# Patient Record
Sex: Male | Born: 1960 | Race: White | Hispanic: No | Smoking: Former smoker
Health system: Southern US, Community
[De-identification: ages and names within clinical notes are randomized; demographics above are authoritative.]

## PROBLEM LIST (undated history)

## (undated) DIAGNOSIS — A770 Spotted fever due to Rickettsia rickettsii: Secondary | ICD-10-CM

## (undated) DIAGNOSIS — K219 Gastro-esophageal reflux disease without esophagitis: Secondary | ICD-10-CM

## (undated) DIAGNOSIS — E119 Type 2 diabetes mellitus without complications: Secondary | ICD-10-CM

## (undated) DIAGNOSIS — I10 Essential (primary) hypertension: Secondary | ICD-10-CM

## (undated) DIAGNOSIS — N200 Calculus of kidney: Secondary | ICD-10-CM

## (undated) DIAGNOSIS — E785 Hyperlipidemia, unspecified: Secondary | ICD-10-CM

## (undated) DIAGNOSIS — F17200 Nicotine dependence, unspecified, uncomplicated: Secondary | ICD-10-CM

## (undated) DIAGNOSIS — Z91018 Allergy to other foods: Secondary | ICD-10-CM

## (undated) HISTORY — DX: Hyperlipidemia, unspecified: E78.5

## (undated) HISTORY — PX: KIDNEY STONE SURGERY: SHX686

## (undated) HISTORY — PX: KNEE SURGERY: SHX244

## (undated) HISTORY — DX: Allergy to other foods: Z91.018

## (undated) HISTORY — DX: Nicotine dependence, unspecified, uncomplicated: F17.200

## (undated) HISTORY — DX: Gastro-esophageal reflux disease without esophagitis: K21.9

---

## 1996-07-18 ENCOUNTER — Encounter: Payer: Self-pay | Admitting: Gastroenterology

## 2001-09-10 ENCOUNTER — Emergency Department (HOSPITAL_COMMUNITY): Admission: EM | Admit: 2001-09-10 | Discharge: 2001-09-10 | Payer: Self-pay | Admitting: Emergency Medicine

## 2001-09-11 ENCOUNTER — Encounter: Payer: Self-pay | Admitting: Emergency Medicine

## 2004-01-12 ENCOUNTER — Ambulatory Visit: Payer: Self-pay | Admitting: Gastroenterology

## 2004-06-16 ENCOUNTER — Emergency Department (HOSPITAL_COMMUNITY): Admission: EM | Admit: 2004-06-16 | Discharge: 2004-06-16 | Payer: Self-pay | Admitting: Emergency Medicine

## 2004-06-20 ENCOUNTER — Ambulatory Visit (HOSPITAL_COMMUNITY): Admission: RE | Admit: 2004-06-20 | Discharge: 2004-06-20 | Payer: Self-pay | Admitting: Urology

## 2004-09-26 ENCOUNTER — Ambulatory Visit (HOSPITAL_COMMUNITY): Admission: RE | Admit: 2004-09-26 | Discharge: 2004-09-26 | Payer: Self-pay | Admitting: Urology

## 2005-07-25 ENCOUNTER — Ambulatory Visit: Payer: Self-pay | Admitting: Gastroenterology

## 2006-07-18 ENCOUNTER — Ambulatory Visit: Payer: Self-pay | Admitting: Gastroenterology

## 2007-07-19 ENCOUNTER — Telehealth: Payer: Self-pay | Admitting: Gastroenterology

## 2007-08-12 ENCOUNTER — Telehealth: Payer: Self-pay | Admitting: Gastroenterology

## 2008-02-18 ENCOUNTER — Telehealth: Payer: Self-pay | Admitting: Gastroenterology

## 2008-04-23 ENCOUNTER — Telehealth: Payer: Self-pay | Admitting: Gastroenterology

## 2008-04-28 ENCOUNTER — Ambulatory Visit: Payer: Self-pay | Admitting: Gastroenterology

## 2008-04-28 DIAGNOSIS — K219 Gastro-esophageal reflux disease without esophagitis: Secondary | ICD-10-CM | POA: Insufficient documentation

## 2008-04-28 DIAGNOSIS — Z87442 Personal history of urinary calculi: Secondary | ICD-10-CM | POA: Insufficient documentation

## 2008-09-07 ENCOUNTER — Telehealth: Payer: Self-pay | Admitting: Gastroenterology

## 2008-09-08 ENCOUNTER — Encounter: Payer: Self-pay | Admitting: Gastroenterology

## 2008-09-22 ENCOUNTER — Telehealth: Payer: Self-pay | Admitting: Gastroenterology

## 2010-04-12 NOTE — Progress Notes (Signed)
Summary: Aciphex   Phone Note Call from Patient Call back at Home Phone [phone]   Caller: Wife Jimmy Collins Call For: Jimmy Collins Reason for Call: Talk to Nurse Details for Reason: Savings Card Summary of Call: Needs to know saving card for Aciphex Initial call taken by: Darryl Lent CMA (AAMA),  September 22, 2008 10:00 AM  Follow-up for Phone Call        Discount card left up front for pt to pick up. Follow-up by: Christie Nottingham CMA (AAMA),  September 22, 2008 10:44 AM

## 2010-04-12 NOTE — Progress Notes (Signed)
Summary: refill  Medications Added ACIPHEX 20 MG  TBEC (RABEPRAZOLE SODIUM) one tablet by mouth once daily       Phone Note Call from Patient Call back at Home Phone [phone]   Caller: Sheran Spine Call For: Russella Dar Reason for Call: Talk to Nurse Details for Reason: meds Summary of Call: meds were denied --can enough meds be called in until REv in 1/12 [facility] [address] # [phone] Initial call taken by: Guadlupe Spanish [facility],  February 18, 2008 8:16 AM  Follow-up for Phone Call        Rx was sent to pts pharmacy and pt notified.  Follow-up by: Christie Nottingham CMA,  February 18, 2008 8:43 AM    New/Updated Medications: ACIPHEX 20 MG  TBEC (RABEPRAZOLE SODIUM) one tablet by mouth once daily   Prescriptions: ACIPHEX 20 MG  TBEC (RABEPRAZOLE SODIUM) one tablet by mouth once daily  #30 x 0   Entered by:   Christie Nottingham CMA   Authorized by:   Meryl Dare MD [facility]   Signed by:   Christie Nottingham CMA on 02/18/2008   Method used:   [facility]        [facility]  [address] [phone]* (retail)       [address]       [place], [place]  [address]       Ph: [phone] or [phone]       Fax: [phone]   RxID:   [id number]

## 2010-04-12 NOTE — Procedures (Signed)
Summary: Gastroenterology EGD PATH  Gastroenterology EGD PATH   Imported By: Lowry Ram CMA 03/23/2008 15:45:04  _____________________________________________________________________  External Attachment:    Type:   Image     Comment:   External Document

## 2010-04-12 NOTE — Progress Notes (Signed)
Summary: meds  Medications Added ACIPHEX 20 MG  TBEC (RABEPRAZOLE SODIUM) one tablet by mouth once daily       Phone Note Call from Patient Call back at Home Phone [phone]   Caller: Patient Call For: Jimmy Collins Reason for Call: Talk to Nurse Summary of Call: Patient needs to speak to nurse regarding insurance and his meds and he also needs samples for his aciphex Initial call taken by: Tawni Levy,  September 07, 2008 8:40 AM  Follow-up for Phone Call        Pt states new insurance will not cover Aciphex and it needs a PA. Pt would like samples until PA can be approved. I left samples up front for pt to pick up and told pt that we will work on [place]. Follow-up by: Christie Nottingham CMA,  September 07, 2008 10:14 AM    New/Updated Medications: ACIPHEX 20 MG  TBEC (RABEPRAZOLE SODIUM) one tablet by mouth once daily

## 2010-04-12 NOTE — Medication Information (Signed)
Summary: PPI til 06/05/11/[employer]  PPI [place] 06/05/11/[employer]   Imported By: Lester Chinook 09/10/2008 10:15:00  _____________________________________________________________________  External Attachment:    Type:   Image     Comment:   External Document

## 2010-04-12 NOTE — Procedures (Signed)
Summary: Gastroenterology EGD  Gastroenterology EGD   Imported By: Lowry Ram CMA 03/23/2008 15:43:19  _____________________________________________________________________  External Attachment:    Type:   Image     Comment:   External Document

## 2010-04-12 NOTE — Progress Notes (Signed)
Summary: Samples/Med change   Phone Note Call from Patient Call back at Home Phone [phone] Call back at or [phone] [name]/spouse   Summary of Call: PT given samples of medicine that has worked well for [place], they forget the name of sample. PT cannot afford Prevacid, would like to change from prevacid to this "little blue pill" Patient's chart has been requested.  Initial call taken by: Verdell Face,  Jul 19, 2007 8:34 AM  Follow-up for Phone Call        per pt. wife-They cannot afford Prevacid. Kapidex two times a day is working for pt., but they aren't sure if they will be able to afford script. Pt. wife to call insurance company and find out which PPI is on their prefered list, call back with that information. Pt. wife states she will call Sherri on Monday. Follow-up by: Laureen Ochs LPN,  Jul 18, 8293 10:14 AM  Additional Follow-up for Phone Call Additional follow up Details #1::        will await return phone call Additional Follow-up by: Darcey Nora RN,  Jul 22, 2007 4:28 PM

## 2010-07-29 NOTE — Op Note (Signed)
Jimmy Collins, Jimmy Collins                ACCOUNT NO.:  [id number]   MEDICAL RECORD NO.:  [id number]          PATIENT TYPE:  AMB   LOCATION:  DAY                          FACILITY[facility]  [facility]   PHYSICIAN:  Claudette Laws, M.D.  DATE OF BIRTH:  [birth year]   DATE OF PROCEDURE:  09/26/2004  DATE OF DISCHARGE:                                 OPERATIVE REPORT   PREOPERATIVE DIAGNOSES:  1.  Proximal right ureteral stone.  2.  Status post extracorporeal shock wave lithotripsy, right renal calculus.  3.  Bilateral renal calculi.   POSTOPERATIVE DIAGNOSES:  1.  Proximal right ureteral stone.  2.  Status post extracorporeal shock wave lithotripsy, right renal calculus.  3.  Bilateral renal calculi.   OPERATION:  1.  Cystoscopy.  2.  Right retrograde pyeloureterogram.  3.  Insertion of a 6 French 26 cm double-J stent.   SURGEON:  Claudette Laws, M.D.   HISTORY:  This is a 50 year old man who over three months ago underwent ESWL  for a right renal stone.  The stone fragmented, except he was left with a  fragment of about 5 x 5 mm, which has been lodged now in the upper right  ureter with no progression now for several weeks.  Intermittently, he does  have renal colic.  We discussed treatment options, and I thought the best  option would be to put up a double-J stent prior to treatment of the  fragment.  He understands and agrees to the proposed surgery.   PROCEDURE:  The patient was prepped and draped in the dorsal lithotomy  position under LMA anesthesia.  Urethroscopy was performed with a #22 [place]  cystoscope.  Interestingly, he was found to have some, what appeared to be,  disruption of the mucosa of the deep bulbous urethra.  Initially, I was  unable to negotiate the cystoscope through the membranous urethra.  I then  passed a 16 [place] coude tip catheter, and this seemed to work its way into  the bladder without any difficulty.  I then backed out the catheter and  recystoscoped him.  He  appeared to have rather tight, membranous urethra,  but the prostate looked normal.  Some elevation of the posterior urethra.  The bladder itself was grossly normal.  No tumors, no calculi.  Normal  ureteral orifices.  Initially, I passed up a 0.38 __________ wire under  fluoroscopic control.  I then passed up a 6 [place] open-ended ureteral  catheter  and removed the wire, performed a retrograde study.  There was  minimal dilation of the ureter.  There was a slight hang-up right over the  course of the stone.  Otherwise, only a mild, if any, right hydronephrosis.  Normal calyces.   I then passed up a guidewire.  Again, using fluoroscopic control.  I backed  out the 6 [place] open-ended ureteral catheter and then over the guidewire,  passed a 6 French 26 cm double-J stent, again using fluoroscopic control.  The proximal end was curled up in the mid calyceal system.  The distal end  was curled up  in the bladder.  I then emptied the bladder and backed out the  cystoscope, again, under direct vision, trying to get an idea of what was  going on in  the deep bulbous, membranous urethra.  I then put in a B&O suppository for  anesthetic purposes, and he was taken back to the recovery room in  satisfactory condition.  The plan now is to take him over to the lithotripsy  truck for ESWL later on this afternoon.       RFS/MEDQ  D:  09/26/2004  T:  09/26/2004  Job:  [id number]

## 2010-07-29 NOTE — Assessment & Plan Note (Signed)
[facility]                         GASTROENTEROLOGY OFFICE NOTE   NAME:ASHLEYJaggar, Benko                       MRN:          [id number]  DATE:07/18/2006                            DOB:          [birth year]    Mr. Salinger returns for followup of GERD.  In addition, he notes a three-  day history of brief, sharp epigastric pain following meals.  The pain  lasted for about 2 to 3 minutes at a time, and after three days, the  symptoms completely abated, and he has been symptom- free for the past  several days.  He has no dysphagia, odynophagia, weight loss, change in  bowel habits, nausea, vomiting, melena, hematochezia or change in stool  caliber.  He previously underwent upper endoscopy in May of 1998, which  showed a small size in hiatal hernia and no other abnormalities.  His  reflux symptoms have generally remained under excellent control on  Prevacid.   CURRENT MEDICATIONS:  1. Prevacid 30 mg p.o. q.a.m.  2. Tylenol p.r.n.   MEDICATION ALLERGIES:  NONE KNOWN.   PHYSICAL EXAMINATION:  GENERAL:  He is having no acute distress.  VITAL SIGNS:  Weight 193.8 pounds, blood pressure 130/72, pulse 84 and  regular.  HEENT: Anicteric. Sclerae, oropharynx clear.  CHEST:  Clear to auscultation bilaterally.  CARDIAC:  Regular rate and rhythm without murmurs appreciated.  ABDOMEN:  Soft and non-tender with normoactive bowel sounds.  No  palpable organomegaly or masses or hernia.   ASSESSMENT/PLAN:  1. Gastroesophageal reflux disease.  Symptoms under excellent control.      I am not certain as to the cause of his several-day history of      brief postprandial epigastric pain, but he will monitor these      symptoms, and if they recur and persist, he will contact our      office.  Renew Prevacid for 12 months and continue anti-reflux      measures.  Return office visit in one year.  2. Colorectal cancer screening.  Colonoscopy beginning at age 50 is  recommended.     Venita Lick. Russella Dar, MD, [facility]  Electronically Signed    MTS/MedQ  DD: 07/24/2006  DT: 07/24/2006  Job #: [id number]

## 2010-09-19 ENCOUNTER — Encounter: Payer: Self-pay | Admitting: Family Medicine

## 2010-09-19 DIAGNOSIS — K219 Gastro-esophageal reflux disease without esophagitis: Secondary | ICD-10-CM | POA: Insufficient documentation

## 2011-05-29 ENCOUNTER — Encounter: Payer: Self-pay | Admitting: Gastroenterology

## 2011-08-24 ENCOUNTER — Emergency Department (HOSPITAL_COMMUNITY): Payer: BC Managed Care – PPO

## 2011-08-24 ENCOUNTER — Encounter (HOSPITAL_COMMUNITY): Payer: Self-pay | Admitting: *Deleted

## 2011-08-24 ENCOUNTER — Emergency Department (HOSPITAL_COMMUNITY)
Admission: EM | Admit: 2011-08-24 | Discharge: 2011-08-24 | Disposition: A | Discharge status: Home / Self Care | Payer: BC Managed Care – PPO | Attending: Emergency Medicine | Admitting: Emergency Medicine

## 2011-08-24 DIAGNOSIS — K219 Gastro-esophageal reflux disease without esophagitis: Secondary | ICD-10-CM | POA: Insufficient documentation

## 2011-08-24 DIAGNOSIS — S39012A Strain of muscle, fascia and tendon of lower back, initial encounter: Secondary | ICD-10-CM

## 2011-08-24 DIAGNOSIS — F17200 Nicotine dependence, unspecified, uncomplicated: Secondary | ICD-10-CM | POA: Insufficient documentation

## 2011-08-24 DIAGNOSIS — M549 Dorsalgia, unspecified: Secondary | ICD-10-CM | POA: Insufficient documentation

## 2011-08-24 HISTORY — DX: Spotted fever due to Rickettsia rickettsii: A77.0

## 2011-08-24 HISTORY — DX: Calculus of kidney: N20.0

## 2011-08-24 LAB — URINALYSIS, ROUTINE W REFLEX MICROSCOPIC
Bilirubin Urine: NEGATIVE
Glucose, UA: NEGATIVE mg/dL
Ketones, ur: NEGATIVE mg/dL
Leukocytes, UA: NEGATIVE
Nitrite: NEGATIVE
Protein, ur: NEGATIVE mg/dL
Specific Gravity, Urine: 1.023 (ref 1.005–1.030)
Urobilinogen, UA: 0.2 mg/dL (ref 0.0–1.0)
pH: 6 (ref 5.0–8.0)

## 2011-08-24 LAB — URINE MICROSCOPIC-ADD ON

## 2011-08-24 MED ORDER — OXYCODONE-ACETAMINOPHEN 5-325 MG PO TABS: 1.0000 | ORAL_TABLET | ORAL | Status: AC | PRN

## 2011-08-24 MED ORDER — HYDROMORPHONE HCL PF 2 MG/ML IJ SOLN
2.0000 mg | Freq: Once | INTRAMUSCULAR | Status: AC
  Administered 2011-08-24: 2 mg via INTRAMUSCULAR
  Filled 2011-08-24: qty 1

## 2011-08-24 MED ORDER — IBUPROFEN 600 MG PO TABS: 600.0000 mg | ORAL_TABLET | Freq: Four times a day (QID) | ORAL | Status: AC | PRN

## 2011-08-24 MED ORDER — OXYCODONE-ACETAMINOPHEN 5-325 MG PO TABS
2.0000 | ORAL_TABLET | Freq: Once | ORAL | Status: AC
  Administered 2011-08-24: 2 via ORAL
  Filled 2011-08-24: qty 2

## 2011-08-24 MED ORDER — ONDANSETRON 4 MG PO TBDP
8.0000 mg | ORAL_TABLET | Freq: Once | ORAL | Status: AC
  Administered 2011-08-24: 8 mg via ORAL

## 2011-08-24 MED ORDER — ONDANSETRON 4 MG PO TBDP
ORAL_TABLET | ORAL | Status: AC
  Filled 2011-08-24: qty 2

## 2011-08-24 NOTE — ED Provider Notes (Signed)
History     CSN: [id number]  Arrival date & time 08/24/11  4098   First MD Initiated Contact with Patient 08/24/11 [phone]      Chief Complaint  Patient presents with  . Back Pain    Pt seen with medical student, I performed history/physical/documentation    Patient is a 51 y.o. male presenting with back pain. The history is provided by the patient.  Back Pain  This is a new problem. The current episode started yesterday. The problem occurs constantly. The problem has been gradually worsening. The pain is associated with no known injury. The pain is present in the lumbar spine. The pain does not radiate. The pain is moderate. The symptoms are aggravated by certain positions and twisting. The pain is the same all the time. Pertinent negatives include no chest pain, no fever, no abdominal pain, no bowel incontinence, no bladder incontinence, no paresis and no weakness. He has tried NSAIDs for the symptoms. The treatment provided mild relief.  PT reports low back pain since yesterday No falls/injury but does report consistent heavy lifting at work on a daily basis No leg weakness No dysuria No fever No urinary symptoms   Past Medical History  Diagnosis Date  . GERD (gastroesophageal reflux disease)   . [facility] spotted fever   . Kidney stones     Past Surgical History  Procedure Date  . Knee surgery     left  . Kidney stone surgery     bilateral    History reviewed. No pertinent family history.  History  Substance Use Topics  . Smoking status: Current Everyday Smoker -- 1.0 packs/day  . Smokeless tobacco: Not on file  . Alcohol Use: No      Review of Systems  Constitutional: Negative for fever.  Cardiovascular: Negative for chest pain.  Gastrointestinal: Negative for abdominal pain and bowel incontinence.  Genitourinary: Negative for bladder incontinence.  Musculoskeletal: Positive for back pain.  Neurological: Negative for weakness.  All other systems  reviewed and are negative.    Allergies  Review of patient's allergies indicates no known allergies.  Home Medications   Current Outpatient Rx  Name Route Sig Dispense Refill  . LANSOPRAZOLE 30 MG PO CPDR Oral Take 30 mg by mouth daily.        BP 113/65  Pulse 81  Temp 97.9 F (36.6 C) (Oral)  Resp 18  SpO2 98%  Physical Exam CONSTITUTIONAL: Well developed/well nourished HEAD AND FACE: Normocephalic/atraumatic EYES: EOMI/PERRL ENMT: Mucous membranes moist NECK: supple no meningeal signs SPINE:entire spine nontender CV: S1/S2 noted, no murmurs/rubs/gallops noted LUNGS: Lungs are clear to auscultation bilaterally, no apparent distress ABDOMEN: soft, nontender, no rebound or guarding GU:no cva tenderness NEURO: Awake/alert, equal distal motor: hip flexion/knee flexion/extension, ankle dorsi/plantar flexion, great toe extension intact bilaterally, no clonus bilaterally,no apparent sensory deficit in any dermatome.   Pt is able to ambulate. EXTREMITIES: pulses normal, full ROM SKIN: warm, color normal PSYCH: no abnormalities of mood noted  ED Course  Procedures    Labs Reviewed  URINALYSIS, ROUTINE W REFLEX MICROSCOPIC   Pt improved, ambulatory, u/a reviewed (blood noted but history not c/w kidney stone and can f/u with PCP for this)  The patient appears reasonably screened and/or stabilized for discharge and I doubt any other medical condition or other [facility] requiring further screening, evaluation, or treatment in the ED at this time prior to discharge.   MDM  Nursing notes including past medical history and social history reviewed  and considered in documentation xrays reviewed and considered All labs/vitals reviewed and considered         Joya Gaskins, MD 08/24/11 1220

## 2011-08-24 NOTE — ED Notes (Signed)
Pt getting sick on stomach while going over discharge instructions.  Verbal order received for zofran 8 mg ODT

## 2011-08-24 NOTE — ED Notes (Signed)
Pt c/o lower back pain since yesterday, no noted injury.  Unable to sit/walk comfortably

## 2011-08-24 NOTE — ED Notes (Signed)
Family at bedside. 

## 2011-08-24 NOTE — ED Notes (Signed)
MD at bedside. 

## 2011-08-24 NOTE — ED Notes (Signed)
Given a urinal and asked to slip jeans off for the xray.

## 2011-08-24 NOTE — Discharge Instructions (Signed)

## 2011-08-24 NOTE — ED Notes (Signed)
Pain in center of back, not to right or left.

## 2011-08-24 NOTE — ED Notes (Signed)
Pt given peanut butter crackers and coke to drink. Offered wheel chair for disposition but declined at this time.

## 2012-06-18 ENCOUNTER — Encounter: Payer: Self-pay | Admitting: Gastroenterology

## 2013-12-30 ENCOUNTER — Encounter (HOSPITAL_COMMUNITY): Payer: Self-pay | Admitting: Emergency Medicine

## 2013-12-30 ENCOUNTER — Emergency Department (HOSPITAL_COMMUNITY)
Admission: EM | Admit: 2013-12-30 | Discharge: 2013-12-31 | Disposition: A | Discharge status: Home / Self Care | Payer: BC Managed Care – PPO | Attending: Emergency Medicine | Admitting: Emergency Medicine

## 2013-12-30 DIAGNOSIS — T788XXA Other adverse effects, not elsewhere classified, initial encounter: Secondary | ICD-10-CM | POA: Insufficient documentation

## 2013-12-30 DIAGNOSIS — Z79899 Other long term (current) drug therapy: Secondary | ICD-10-CM | POA: Insufficient documentation

## 2013-12-30 DIAGNOSIS — Z720 Tobacco use: Secondary | ICD-10-CM | POA: Insufficient documentation

## 2013-12-30 DIAGNOSIS — Z87442 Personal history of urinary calculi: Secondary | ICD-10-CM | POA: Insufficient documentation

## 2013-12-30 DIAGNOSIS — R0602 Shortness of breath: Secondary | ICD-10-CM | POA: Insufficient documentation

## 2013-12-30 DIAGNOSIS — Y9269 Other specified industrial and construction area as the place of occurrence of the external cause: Secondary | ICD-10-CM | POA: Insufficient documentation

## 2013-12-30 DIAGNOSIS — Y990 Civilian activity done for income or pay: Secondary | ICD-10-CM | POA: Insufficient documentation

## 2013-12-30 DIAGNOSIS — Y9389 Activity, other specified: Secondary | ICD-10-CM | POA: Insufficient documentation

## 2013-12-30 DIAGNOSIS — R21 Rash and other nonspecific skin eruption: Secondary | ICD-10-CM

## 2013-12-30 DIAGNOSIS — L500 Allergic urticaria: Secondary | ICD-10-CM | POA: Insufficient documentation

## 2013-12-30 DIAGNOSIS — K219 Gastro-esophageal reflux disease without esophagitis: Secondary | ICD-10-CM | POA: Insufficient documentation

## 2013-12-30 DIAGNOSIS — Z8619 Personal history of other infectious and parasitic diseases: Secondary | ICD-10-CM | POA: Insufficient documentation

## 2013-12-30 DIAGNOSIS — T7840XA Allergy, unspecified, initial encounter: Secondary | ICD-10-CM

## 2013-12-30 DIAGNOSIS — R1310 Dysphagia, unspecified: Secondary | ICD-10-CM | POA: Insufficient documentation

## 2013-12-30 NOTE — ED Notes (Signed)
Bed: [id number] Expected date:  Expected time:  Means of arrival:  Comments: EMS 53yo M, dizziness, itching, ? Allergic reaction

## 2013-12-30 NOTE — ED Notes (Signed)
Pt arrived via EMS from his place of employment.  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  Pt began having extreme itching.  Pt also began to have hives.  Pt was given 50 mg of benadryl, 50 of zantac, 125 mg of solumedrol and a 500 ml bolus of saline by EMS with relief of symptoms.  Pt initially for EMS was hypotensive but got better after having a bolus of fluid

## 2013-12-31 MED ORDER — EPINEPHRINE 0.3 MG/0.3ML IJ SOAJ
0.3000 mg | Freq: Once | INTRAMUSCULAR | Status: AC
  Administered 2013-12-31: 0.3 mg via INTRAMUSCULAR
  Filled 2013-12-31: qty 0.3

## 2013-12-31 MED ORDER — EPINEPHRINE 0.3 MG/0.3ML IJ SOAJ: 0.3000 mg | Freq: Once | INTRAMUSCULAR | Status: DC | PRN

## 2013-12-31 MED ORDER — DIPHENHYDRAMINE HCL 50 MG/ML IJ SOLN
25.0000 mg | Freq: Once | INTRAMUSCULAR | Status: AC
  Administered 2013-12-31: 25 mg via INTRAVENOUS
  Filled 2013-12-31: qty 1

## 2013-12-31 MED ORDER — PREDNISONE 20 MG PO TABS: 40.0000 mg | ORAL_TABLET | Freq: Every day | ORAL | Status: DC

## 2013-12-31 NOTE — ED Provider Notes (Signed)
CSN: [id number]     Arrival date & time 12/30/13  2318 History   First MD Initiated Contact with Patient 12/30/13 2337     Chief Complaint  Patient presents with  . Rash     (Consider location/radiation/quality/duration/timing/severity/associated sxs/prior Treatment) HPI Comments: Patient is a 53 year old male with history of GERD, [place] spotted fever, kidney stones he presents to the emergency department today for sudden onset itchy rash. He reports that he was at work prior to arrival when he began to have extreme itching diffusely over his body. He broke out in hives. He has associated shortness of breath and sensation of his throat was closing. He received 50 mg of Benadryl, 50 mg of Zantac, and 125 mg of Solu-Medrol by EMS. He feels significantly improved. He denies any current shortness of breath, sensation that his throat is closing. He has rash, but states that his rash is much less severe. He denies any known allergies. 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 He denies any new foods.  Patient is a 53 y.o. male presenting with rash. The history is provided by the patient. No language interpreter was used.  Rash Associated symptoms: shortness of breath   Associated symptoms: no abdominal pain, no fever, no nausea and not vomiting     Past Medical History  Diagnosis Date  . GERD (gastroesophageal reflux disease)   . [facility]   . Kidney stones    Past Surgical History  Procedure Laterality Date  . Knee surgery      left  . Kidney stone surgery      bilateral   History reviewed. No pertinent family history. History  Substance Use Topics  . Smoking status: Current Every Day Smoker -- 1.00 packs/day  . Smokeless tobacco: Not on file  . Alcohol Use: No    Review of Systems  Constitutional: Negative for fever and chills.  HENT: Positive for trouble swallowing.   Respiratory: Positive for shortness of breath.   Cardiovascular:  Negative for chest pain.  Gastrointestinal: Negative for nausea, vomiting and abdominal pain.  Skin: Positive for rash.  All other systems reviewed and are negative.     Allergies  Review of patient's allergies indicates no known allergies.  Home Medications   Prior to Admission medications   Medication Sig Start Date End Date Taking? Authorizing Provider  Aspirin-Acetaminophen-Caffeine (GOODY HEADACHE PO) Take 1 each by mouth daily as needed (headache).   Yes Historical Provider, MD  lansoprazole (PREVACID) 30 MG capsule Take 30 mg by mouth daily.     Yes Historical Provider, MD   BP 109/66  Pulse 94  Temp(Src) 97.3 F (36.3 C) (Oral)  Resp 16  SpO2 100% Physical Exam  Nursing note and vitals reviewed. Constitutional: He is oriented to person, place, and time. He appears well-developed and well-nourished. No distress.  Laying flat in bed, appears comfortable  HENT:  Head: Normocephalic and atraumatic.  Right Ear: External ear normal.  Left Ear: External ear normal.  Nose: Nose normal.  Mouth/Throat: Oropharynx is clear and moist. No trismus in the jaw. No uvula swelling.  Easily maintaining own secretions   Eyes: Conjunctivae are normal.  Neck: Normal range of motion. No tracheal deviation present.  Cardiovascular: Normal rate, regular rhythm, normal heart sounds, intact distal pulses and normal pulses.   Pulmonary/Chest: Effort normal and breath sounds normal. No stridor.  Speaking in full sentences  Abdominal: Soft. He exhibits no distension. There is  no tenderness.  Musculoskeletal: Normal range of motion.  Neurological: He is alert and oriented to person, place, and time.  Skin: Skin is warm and dry. Rash noted. Rash is urticarial. He is not diaphoretic.  Severe urticarial rash diffusely over body  Psychiatric: He has a normal mood and affect. His behavior is normal.    ED Course  Procedures (including critical care time) Labs Review Labs Reviewed - No data to  display  Imaging Review No results found.   EKG Interpretation None      CRITICAL CARE Performed by: Junious SilkMerrell, Oather Muilenburg   Total critical care time: 31  Critical care time was exclusive of separately billable procedures and treating other patients.  Critical care was necessary to treat or prevent imminent or life-threatening deterioration.  Critical care was time spent personally by me on the following activities: development of treatment plan with patient and/or surrogate as well as nursing, discussions with consultants, evaluation of patient's response to treatment, examination of patient, obtaining history from patient or surrogate, ordering and performing treatments and interventions, ordering and review of laboratory studies, ordering and review of radiographic studies, pulse oximetry and re-evaluation of patient's condition.   MDM   Final diagnoses:  Rash and nonspecific skin eruption  Allergic reaction, initial encounter   Patient to ED with severe allergic reaction. Epi given in ED. Patient will be observed to ensure no rebound or adverse effects of epi. Patient is feeling improved. Vital signs stable at this time. Patient signed out to [place], PA-C at change of shift. Dr. Norlene Campbelltter evaluated patient and agrees with plan. Patient / Family / Caregiver informed of clinical course, understand medical decision-making process, and agree with plan.   Medications  diphenhydrAMINE (BENADRYL) injection 25 mg (25 mg Intravenous Given 12/31/13 0128)  EPINEPHrine (EPI-PEN) injection 0.3 mg (0.3 mg Intramuscular Given 12/31/13 0128)      Mora BellmanHannah S Naethan Bracewell, PA-C 12/31/13 1236

## 2013-12-31 NOTE — Discharge Instructions (Signed)
Take the prescribed medication as directed.  Use epi-pen only for absolute emergencies.  If used, you must come to the ED for evaluation and close monitoring. Follow-up with your primary care physician. Return to the ED for new or worsening symptoms.

## 2013-12-31 NOTE — ED Provider Notes (Signed)
Medical screening examination/treatment/procedure(s) were conducted as a shared visit with non-physician practitioner(s) and myself.  I personally evaluated the patient during the encounter.   EKG Interpretation None     Pt seen with PA.  Sxs have improved, no rebound from epi.  Plan for d/c home to f/u with pcm/allergy  Olivia Mackielga M Earley Grobe, MD 12/31/13 [phone]

## 2013-12-31 NOTE — ED Provider Notes (Signed)
Patient received in signout from PA Merrell at shift change.  Briefly, 53 year old male with diffuse urticarial rash and bodily itching. Patient was given Benadryl, Zantac, and Solu-Medrol by EMS with improvement of his symptoms. He still had complaint of sensation of throat closing or shortness of breath thus was given epinephrine in the ED with resolution of symptoms.  Plan:  Monitor post-epi injection for rebound reaction.  If no other symptoms and VS remain stable, will discharge home with close PCP FU.  Patient due for re-check around 0530.  5:29 AM On repeat evaluation, patient talking on the phone with his wife.  Airway remains patent, he continues handling secretions well.  He denies any feelings of throat closing, trouble swallowing, or SOB.  Feel patient appropriate for discharge at this time.  Will send home with steroid taper and epi-pen should similar reaction occur.  Patient educated on use of epi-pen and need for emergent medical attention should he need to use it.  Patient will FU with PCP.  Discussed plan with patient, he/she acknowledged understanding and agreed with plan of care.  Return precautions given for new or worsening symptoms.    ICD-9-CM ICD-10-CM  1. Rash and nonspecific skin eruption 782.1 R21  2. Allergic reaction, initial encounter 995.3 T78.40XA    Garlon HatchetLisa M Anise Harbin, PA-C 12/31/13 0543

## 2014-01-01 NOTE — ED Provider Notes (Signed)
Medical screening examination/treatment/procedure(s) were conducted as a shared visit with non-physician practitioner(s) and myself.  I personally evaluated the patient during the encounter.   EKG Interpretation None     Pt seen with PA.  Diffuse hives, hypotensive in route.  Sxs not completely abated but no further true anaphylaxis.  No known trigger.  Pt to receive epi IM and will continue to monitor  Olivia Mackielga M Carolyn Maniscalco, MD 01/01/14 [phone]

## 2014-01-05 ENCOUNTER — Encounter: Payer: Self-pay | Admitting: Family Medicine

## 2014-01-05 ENCOUNTER — Ambulatory Visit (INDEPENDENT_AMBULATORY_CARE_PROVIDER_SITE_OTHER): Payer: No Typology Code available for payment source | Admitting: Family Medicine

## 2014-01-05 VITALS — BP 110/74 | HR 84 | Temp 98.1°F | Resp 18 | Ht 65.0 in | Wt 192.0 lb

## 2014-01-05 DIAGNOSIS — T782XXD Anaphylactic shock, unspecified, subsequent encounter: Secondary | ICD-10-CM

## 2014-01-05 NOTE — Progress Notes (Signed)
Subjective:    Patient ID: Jimmy Collins, male    DOB: 1960/08/28, 53 y.o.   MRN: [id number]  HPI Patient is a 53 year old white male who went to the emergency room on October 20 after having an anaphylactic reaction. He was at work when he broke out in hives and urticaria on both arms and his trunk. Shortly thereafter he experienced swelling in his tongue and in his throat. This was accompanied by stridor and shortness of breath. EMS was called. The patient was rushed to the hospital where he received an EpiPen. Immediately thereafter his symptoms improved. He was discharged on Benadryl and prednisone. At the present time he is asymptomatic. He denies any chest pain shortness of breath or dyspnea on exertion. He denies any palpitations. He denies any rash. The only food the patient ingested the day of the anaphylactic reaction were some meatballs. Otherwise he had no ingestions that day 5 from some Goody powder. He has since discontinued Goody powder and is no longer eating the frozen meatballs. There are no possible allergen exposures at work. He has returned to work without difficulty. Past Medical History  Diagnosis Date  . GERD (gastroesophageal reflux disease)   . [facility]   . Kidney stones    Past Surgical History  Procedure Laterality Date  . Knee surgery      left  . Kidney stone surgery      bilateral   Current Outpatient Prescriptions on File Prior to Visit  Medication Sig Dispense Refill  . Aspirin-Acetaminophen-Caffeine (GOODY HEADACHE PO) Take 1 each by mouth daily as needed (headache).      . EPINEPHrine (EPIPEN 2-PAK) 0.3 mg/0.3 mL IJ SOAJ injection Inject 0.3 mLs (0.3 mg total) into the muscle once as needed (for severe allergic reaction). CAll 911 immediately if you have to use this medicine  1 Device  1  . lansoprazole (PREVACID) 30 MG capsule Take 30 mg by mouth daily.         No current facility-administered medications on file prior to visit.    No Known Allergies History   Social History  . Marital Status: Married    Spouse Name: N/A    Number of Children: N/A  . Years of Education: N/A   Occupational History  . Not on file.   Social History Main Topics  . Smoking status: Current Every Day Smoker -- 1.00 packs/day  . Smokeless tobacco: Not on file  . Alcohol Use: No  . Drug Use:   . Sexual Activity:    Other Topics Concern  . Not on file   Social History Narrative  . No narrative on file      Review of Systems  All other systems reviewed and are negative.      Objective:   Physical Exam  Vitals reviewed. Constitutional: He appears well-developed and well-nourished. No distress.  HENT:  Nose: Nose normal.  Mouth/Throat: Oropharynx is clear and moist. No oropharyngeal exudate.  Neck: Neck supple. No tracheal deviation present.  Cardiovascular: Normal rate, regular rhythm and normal heart sounds.   No murmur heard. Pulmonary/Chest: Effort normal and breath sounds normal. No stridor. No respiratory distress. He has no wheezes. He has no rales.  Abdominal: Soft. Bowel sounds are normal.  Lymphadenopathy:    He has no cervical adenopathy.  Skin: No rash noted. He is not diaphoretic.          Assessment & Plan:  Anaphylactic reaction, subsequent encounter  Patient's anaphylactic  reaction was to an unknown substance. I am assuming he may have ingested some type of nut/protein in the meatballs that may have triggered the anaphylactic reaction. I gave the patient prescription for an EpiPen but I wrote in his wife's name so that hopefully they can have this covered under her insurance as the patient has no insurance at the present time and EpiPen is over $500. I also recommended that he carry Benadryl and EpiPen with him at all time. I would recommend referral to an allergist of her reaction happens again in the future.

## 2014-01-22 ENCOUNTER — Encounter: Payer: Self-pay | Admitting: Family Medicine

## 2014-01-28 ENCOUNTER — Telehealth: Payer: Self-pay | Admitting: *Deleted

## 2014-01-28 MED ORDER — CYCLOBENZAPRINE HCL 10 MG PO TABS: 10.0000 mg | ORAL_TABLET | Freq: Three times a day (TID) | ORAL | Status: DC | PRN

## 2014-01-28 NOTE — Telephone Encounter (Signed)
Send in flexeril 10mg  1 po TID prn, do not take while driving or operative heavy machinery  #30 tabs If not better needs to be seen

## 2014-01-28 NOTE — Telephone Encounter (Signed)
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  Call back number is [phone]

## 2014-01-28 NOTE — Telephone Encounter (Signed)
Medication sent in pt wife aware

## 2014-01-29 ENCOUNTER — Other Ambulatory Visit: Payer: Self-pay | Admitting: Family Medicine

## 2014-01-29 MED ORDER — CYCLOBENZAPRINE HCL 10 MG PO TABS: 10.0000 mg | ORAL_TABLET | Freq: Three times a day (TID) | ORAL | Status: DC | PRN

## 2014-01-29 NOTE — Telephone Encounter (Signed)
Refill sent to another pharmacy per pt request

## 2014-02-13 ENCOUNTER — Telehealth: Payer: Self-pay | Admitting: Family Medicine

## 2014-02-13 NOTE — Telephone Encounter (Signed)
Received patient EpiPen 2-pak through [employer] patient assistance.  I have notified patient (spoke to wife) that medication has arrived and is ready for pick up.

## 2014-02-17 NOTE — Telephone Encounter (Signed)
Pt did come and pick up Epi Pen.  [facility]

## 2014-07-31 ENCOUNTER — Encounter (HOSPITAL_COMMUNITY): Payer: Self-pay | Admitting: Emergency Medicine

## 2014-07-31 ENCOUNTER — Emergency Department (HOSPITAL_COMMUNITY)
Admission: EM | Admit: 2014-07-31 | Discharge: 2014-07-31 | Disposition: A | Discharge status: Home / Self Care | Payer: BLUE CROSS/BLUE SHIELD | Attending: Emergency Medicine | Admitting: Emergency Medicine

## 2014-07-31 DIAGNOSIS — Z8619 Personal history of other infectious and parasitic diseases: Secondary | ICD-10-CM | POA: Diagnosis not present

## 2014-07-31 DIAGNOSIS — Y998 Other external cause status: Secondary | ICD-10-CM | POA: Insufficient documentation

## 2014-07-31 DIAGNOSIS — Y9289 Other specified places as the place of occurrence of the external cause: Secondary | ICD-10-CM | POA: Diagnosis not present

## 2014-07-31 DIAGNOSIS — Z79899 Other long term (current) drug therapy: Secondary | ICD-10-CM | POA: Diagnosis not present

## 2014-07-31 DIAGNOSIS — Z87442 Personal history of urinary calculi: Secondary | ICD-10-CM | POA: Insufficient documentation

## 2014-07-31 DIAGNOSIS — Z720 Tobacco use: Secondary | ICD-10-CM | POA: Diagnosis not present

## 2014-07-31 DIAGNOSIS — X58XXXA Exposure to other specified factors, initial encounter: Secondary | ICD-10-CM | POA: Diagnosis not present

## 2014-07-31 DIAGNOSIS — K219 Gastro-esophageal reflux disease without esophagitis: Secondary | ICD-10-CM | POA: Diagnosis not present

## 2014-07-31 DIAGNOSIS — Y9389 Activity, other specified: Secondary | ICD-10-CM | POA: Diagnosis not present

## 2014-07-31 DIAGNOSIS — L500 Allergic urticaria: Secondary | ICD-10-CM | POA: Insufficient documentation

## 2014-07-31 DIAGNOSIS — T7840XA Allergy, unspecified, initial encounter: Secondary | ICD-10-CM

## 2014-07-31 DIAGNOSIS — R000 Tachycardia, unspecified: Secondary | ICD-10-CM | POA: Diagnosis not present

## 2014-07-31 MED ORDER — PREDNISONE 20 MG PO TABS: 40.0000 mg | ORAL_TABLET | Freq: Every day | ORAL | Status: DC

## 2014-07-31 MED ORDER — HYDROXYZINE HCL 25 MG PO TABS: 25.0000 mg | ORAL_TABLET | Freq: Four times a day (QID) | ORAL | Status: DC | PRN

## 2014-07-31 MED ORDER — METHYLPREDNISOLONE SODIUM SUCC 125 MG IJ SOLR
125.0000 mg | Freq: Once | INTRAMUSCULAR | Status: AC
  Administered 2014-07-31: 125 mg via INTRAVENOUS
  Filled 2014-07-31: qty 2

## 2014-07-31 MED ORDER — LORAZEPAM 2 MG/ML IJ SOLN
1.0000 mg | Freq: Once | INTRAMUSCULAR | Status: AC
  Administered 2014-07-31: 1 mg via INTRAVENOUS
  Filled 2014-07-31: qty 1

## 2014-07-31 MED ORDER — FAMOTIDINE IN NACL 20-0.9 MG/50ML-% IV SOLN
20.0000 mg | Freq: Once | INTRAVENOUS | Status: AC
  Administered 2014-07-31: 20 mg via INTRAVENOUS
  Filled 2014-07-31: qty 50

## 2014-07-31 MED ORDER — FAMOTIDINE 20 MG PO TABS: 20.0000 mg | ORAL_TABLET | Freq: Two times a day (BID) | ORAL | Status: DC

## 2014-07-31 MED ORDER — DIPHENHYDRAMINE HCL 50 MG/ML IJ SOLN
50.0000 mg | Freq: Once | INTRAMUSCULAR | Status: AC
  Administered 2014-07-31: 50 mg via INTRAVENOUS
  Filled 2014-07-31: qty 1

## 2014-07-31 NOTE — ED Provider Notes (Signed)
CSN: [id number]     Arrival date & time 07/31/14  2059 History   First MD Initiated Contact with Patient 07/31/14 2109     Chief Complaint  Patient presents with  . Allergic Reaction     (Consider location/radiation/quality/duration/timing/severity/associated sxs/prior Treatment) HPI Comments: Patient after sudden onset of diffuse body pruritus which began while he was at work. History of similar symptoms in the past without cause. Patient took Benadryl without relief. He did have an EpiPen which she did not use. Denies any throat swelling. Itching has been severe nothing makes it better. Denies any trouble breathing.  Patient is a 54 y.o. male presenting with allergic reaction. The history is provided by the patient and the spouse.  Allergic Reaction   Past Medical History  Diagnosis Date  . GERD (gastroesophageal reflux disease)   . [facility]   . Kidney stones    Past Surgical History  Procedure Laterality Date  . Knee surgery      left  . Kidney stone surgery      bilateral   No family history on file. History  Substance Use Topics  . Smoking status: Current Every Day Smoker -- 1.00 packs/day  . Smokeless tobacco: Not on file  . Alcohol Use: No    Review of Systems  All other systems reviewed and are negative.     Allergies  Review of patient's allergies indicates no known allergies.  Home Medications   Prior to Admission medications   Medication Sig Start Date End Date Taking? Authorizing Provider  Aspirin-Acetaminophen-Caffeine (GOODY HEADACHE PO) Take 1 each by mouth daily as needed (headache).    Historical Provider, MD  cyclobenzaprine (FLEXERIL) 10 MG tablet Take 1 tablet (10 mg total) by mouth 3 (three) times daily as needed for muscle spasms. 01/29/14   Salley ScarletKawanta F Beaumont, MD  EPINEPHrine (EPIPEN 2-PAK) 0.3 mg/0.3 mL IJ SOAJ injection Inject 0.3 mLs (0.3 mg total) into the muscle once as needed (for severe allergic reaction). CAll 911  immediately if you have to use this medicine 12/31/13   Garlon HatchetLisa M Sanders, PA-C  lansoprazole (PREVACID) 30 MG capsule Take 30 mg by mouth daily.      Historical Provider, MD   BP 144/103 mmHg  Pulse 140  Temp(Src) 98.3 F (36.8 C) (Oral)  Resp 20  SpO2 98% Physical Exam  Constitutional: He is oriented to person, place, and time. He appears well-developed and well-nourished.  Non-toxic appearance. No distress.  HENT:  Head: Normocephalic and atraumatic.  Eyes: Conjunctivae, EOM and lids are normal. Pupils are equal, round, and reactive to light.  Neck: Normal range of motion. Neck supple. No tracheal deviation present. No thyroid mass present.  Cardiovascular: Regular rhythm and normal heart sounds.  Tachycardia present.  Exam reveals no gallop.   No murmur heard. Pulmonary/Chest: Effort normal and breath sounds normal. No stridor. No respiratory distress. He has no decreased breath sounds. He has no wheezes. He has no rhonchi. He has no rales.  Abdominal: Soft. Normal appearance and bowel sounds are normal. He exhibits no distension. There is no tenderness. There is no rebound and no CVA tenderness.  Musculoskeletal: Normal range of motion. He exhibits no edema or tenderness.  Neurological: He is alert and oriented to person, place, and time. He has normal strength. No cranial nerve deficit or sensory deficit. GCS eye subscore is 4. GCS verbal subscore is 5. GCS motor subscore is 6.  Skin: Skin is warm and dry. Rash noted. No  abrasion noted. Rash is urticarial.  Psychiatric: He has a normal mood and affect. His speech is normal and behavior is normal.  Nursing note and vitals reviewed.   ED Course  Procedures (including critical care time) Labs Review Labs Reviewed - No data to display  Imaging Review No results found.   EKG Interpretation   Date/Time:  Friday Jul 31 2014 21:10:30 EDT Ventricular Rate:  130 PR Interval:  134 QRS Duration: 98 QT Interval:  318 QTC Calculation:  468 R Axis:   -96 Text Interpretation:  Sinus tachycardia Left anterior fascicular block  RSR' in V1 or V2, right VCD or RVH Baseline wander in lead(s) II III aVR  aVF V4 V6 faster than prior Confirmed by Joanell Cressler  MD, Tamanna Whitson ([id number]) on  07/31/2014 10:40:16 PM      MDM   Final diagnoses:  None    Patient given Solu-Medrol, Benadryl, Pepcid. She monitored here in had persistent pruritus and then was given Ativan he feels much better at this time. Patient's diffuse hives have gradually resolved. His airways intact. He is stable for discharge  .CRITICAL CARE Performed by: Toy BakerALLEN,Regino Fournet T Total critical care time: 60 Critical care time was exclusive of separately billable procedures and treating other patients. Critical care was necessary to treat or prevent imminent or life-threatening deterioration. Critical care was time spent personally by me on the following activities: development of treatment plan with patient and/or surrogate as well as nursing, discussions with consultants, evaluation of patient's response to treatment, examination of patient, obtaining history from patient or surrogate, ordering and performing treatments and interventions, ordering and review of laboratory studies, ordering and review of radiographic studies, pulse oximetry and re-evaluation of patient's condition.     Lorre NickAnthony Amariyon Maynes, MD 07/31/14 [phone]

## 2014-07-31 NOTE — ED Notes (Signed)
Pt still itching. Md notified. Ice packs given for comfort.

## 2014-07-31 NOTE — Discharge Instructions (Signed)

## 2014-07-31 NOTE — ED Notes (Signed)
Pt c/o allergic reaction about an hour ago to "something he was using at work."  Pt has hives on arms and legs, back and head. Pt appears very uncomfortable and nervous. Pt sts that this has happened one other time before and his throat became swollen and he was unable to breathe. Pt not having difficulty breathing at this time but sts that in previous reaction he experienced itching and hives prior to breathing issues.

## 2014-08-07 ENCOUNTER — Telehealth: Payer: Self-pay | Admitting: Family Medicine

## 2014-08-07 MED ORDER — HYDROXYZINE HCL 25 MG PO TABS: 25.0000 mg | ORAL_TABLET | Freq: Four times a day (QID) | ORAL | Status: DC | PRN

## 2014-08-07 NOTE — Telephone Encounter (Signed)
Be glad to

## 2014-08-07 NOTE — Telephone Encounter (Signed)
Patients wife donna calling us regarding anther hospital visit for itching, however this time did not have to use epi pen, but has an appointment with dermatologist on June 10th. Would like to know if Dr Tanya NonesPickard could refill his hydroxyzine hcl 25 mg tab until then, donna would like a call back regarding this please   [phone] (H)

## 2014-08-07 NOTE — Telephone Encounter (Signed)
Medication called/sent to requested pharmacy and pt's wife aware 

## 2014-08-07 NOTE — Telephone Encounter (Signed)
?   OK to Refill  

## 2014-09-03 ENCOUNTER — Telehealth: Payer: Self-pay | Admitting: Family Medicine

## 2014-09-03 NOTE — Telephone Encounter (Signed)
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

## 2014-09-03 NOTE — Telephone Encounter (Addendum)
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 York Spaniel it is caused by lonestar tick

## 2014-09-03 NOTE — Telephone Encounter (Signed)
There is no such disease I can find in up to date.  I think Darl Pikes got the name wrong.  Please call patient and find out what they need because I have never heard of Milian meat disease.

## 2014-09-03 NOTE — Telephone Encounter (Signed)
Called and spoke to patient and that is the name the allergist used it is when you get bit by a lone start tick and then you start to have an allergic rxn to all mammal products. He has to avoid all mammal products and carry Epipens around with him. WTP aware.

## 2014-09-04 ENCOUNTER — Encounter: Payer: Self-pay | Admitting: Family Medicine

## 2014-09-04 DIAGNOSIS — Z91018 Allergy to other foods: Secondary | ICD-10-CM | POA: Insufficient documentation

## 2017-12-10 ENCOUNTER — Telehealth: Payer: Self-pay | Admitting: Family Medicine

## 2017-12-10 NOTE — Telephone Encounter (Signed)
I called wife back to make an appointment for patient. She wouldn't make an appointment at this time she will call back to schedule. She is very persistent in knowing if we can order the test or not. She states  that she will make an appointment after we advise if we can order the test or not and she states "I will be pissed if he comes in for an office visit and the test can't be ordered".  He hasn't been seen since 01/05/2014, I was trying to offer an appointment without going through the new patient process.  Please advise.

## 2017-12-10 NOTE — Telephone Encounter (Signed)
Patients wife called and left a vm asking if the blood test that checked to see if he still has diease from tick bite. She left a lengthy message was saying "Alpha Gab" was the blood test or did he have to see the allergy doctor again. She states he was tested in the past for this and would like to be tested again.   CB # [phone]

## 2017-12-11 NOTE — Telephone Encounter (Signed)
Pt's wife aware we can draw this in office but will need an ov. She states that she will call back to schedule.

## 2018-01-28 ENCOUNTER — Encounter: Payer: Self-pay | Admitting: Family Medicine

## 2018-01-28 ENCOUNTER — Ambulatory Visit (INDEPENDENT_AMBULATORY_CARE_PROVIDER_SITE_OTHER): Payer: Managed Care, Other (non HMO) | Admitting: Family Medicine

## 2018-01-28 VITALS — BP 144/90 | HR 74 | Temp 98.0°F | Resp 14 | Ht 65.0 in | Wt 212.0 lb

## 2018-01-28 DIAGNOSIS — Z0000 Encounter for general adult medical examination without abnormal findings: Secondary | ICD-10-CM

## 2018-01-28 DIAGNOSIS — Z91018 Allergy to other foods: Secondary | ICD-10-CM | POA: Diagnosis not present

## 2018-01-28 DIAGNOSIS — Z1159 Encounter for screening for other viral diseases: Secondary | ICD-10-CM

## 2018-01-28 DIAGNOSIS — Z125 Encounter for screening for malignant neoplasm of prostate: Secondary | ICD-10-CM | POA: Diagnosis not present

## 2018-01-28 MED ORDER — EPINEPHRINE 0.3 MG/0.3ML IJ SOAJ: 0.3000 mg | Freq: Once | INTRAMUSCULAR | 1 refills | Status: DC | PRN

## 2018-01-28 NOTE — Progress Notes (Signed)
Subjective:    Patient ID: Jimmy Collins, male    DOB: 1960-07-28, 57 y.o.   MRN: [id number]  HPI  12/2013 Patient is a 57 year old white male who went to the emergency room on October 20 after having an anaphylactic reaction. He was at work when he broke out in hives and urticaria on both arms and his trunk. Shortly thereafter he experienced swelling in his tongue and in his throat. This was accompanied by stridor and shortness of breath. EMS was called. The patient was rushed to the hospital where he received an EpiPen. Immediately thereafter his symptoms improved. He was discharged on Benadryl and prednisone. At the present time he is asymptomatic. He denies any chest pain shortness of breath or dyspnea on exertion. He denies any palpitations. He denies any rash. The only food the patient ingested the day of the anaphylactic reaction were some meatballs. Otherwise he had no ingestions that day 5 from some Goody powder. He has since discontinued Goody powder and is no longer eating the frozen meatballs. There are no possible allergen exposures at work. He has returned to work without difficulty.  At that time, my plan was: Patient's anaphylactic reaction was to an unknown substance. I am assuming he may have ingested some type of nut/protein in the meatballs that may have triggered the anaphylactic reaction. I gave the patient prescription for an EpiPen but I wrote in his wife's name so that hopefully they can have this covered under her insurance as the patient has no insurance at the present time and EpiPen is over $500. I also recommended that he carry Benadryl and EpiPen with him at all time. I would recommend referral to an allergist of her reaction happens again in the future.  01/28/18 Here today for complete physical exam.  Has not been seen since 2015.  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  He would like a lab panel to see if he still has that  allergy.  Overdue for prostate cancer screening, hepatitis C screening, colonoscopy, flu shot, tetanus shot as well as regular screening lab work pertaining to a physical exam.  Patient also smokes therefore he would be due for Pneumovax 23 as well. Past Medical History:  Diagnosis Date  . 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 GERD (gastroesophageal reflux disease)   . Kidney stones   . [place] spotted fever    Past Surgical History:  Procedure Laterality Date  . KIDNEY STONE SURGERY     bilateral  . KNEE SURGERY     left   Current Outpatient Medications on File Prior to Visit  Medication Sig Dispense Refill  . acetaminophen (TYLENOL) 500 MG tablet Take 1,000 mg by mouth every 6 (six) hours as needed for headache (headache).    . cyclobenzaprine (FLEXERIL) 10 MG tablet Take 1 tablet (10 mg total) by mouth 3 (three) times daily as needed for muscle spasms. (Patient not taking: Reported on 07/31/2014) 30 tablet 0  . diphenhydrAMINE (BENADRYL) 25 MG tablet Take 50 mg by mouth every 6 (six) hours as needed for allergies (allergic reaction).    Marland Kitchen EPINEPHrine (EPIPEN 2-PAK) 0.3 mg/0.3 mL IJ SOAJ injection Inject 0.3 mLs (0.3 mg total) into the muscle once as needed (for severe allergic reaction). CAll 911 immediately if you have to use this medicine 1 Device 1  . famotidine (PEPCID) 20 MG tablet Take  1 tablet (20 mg total) by mouth 2 (two) times daily. 20 tablet 0  . hydrOXYzine (ATARAX/VISTARIL) 25 MG tablet Take 1 tablet (25 mg total) by mouth every 6 (six) hours as needed for itching. 30 tablet 0  . lansoprazole (PREVACID) 30 MG capsule Take 30 mg by mouth daily.      . predniSONE (DELTASONE) 20 MG tablet Take 2 tablets (40 mg total) by mouth daily. 10 tablet 0   No current facility-administered medications on file prior to visit.    Allergies  Allergen Reactions  . Other Hives, Itching and Swelling    Mammalian Meat Allergy   Social  History   Socioeconomic History  . Marital status: Married    Spouse name: Not on file  . Number of children: Not on file  . Years of education: Not on file  . Highest education level: Not on file  Occupational History  . Not on file  Social Needs  . Financial resource strain: Not on file  . Food insecurity:    Worry: Not on file    Inability: Not on file  . Transportation needs:    Medical: Not on file    Non-medical: Not on file  Tobacco Use  . Smoking status: Current Every Day Smoker    Packs/day: 1.00  Substance and Sexual Activity  . Alcohol use: No  . Drug use: Not on file  . Sexual activity: Not on file  Lifestyle  . Physical activity:    Days per week: Not on file    Minutes per session: Not on file  . Stress: Not on file  Relationships  . Social connections:    Talks on phone: Not on file    Gets together: Not on file    Attends religious service: Not on file    Active member of club or organization: Not on file    Attends meetings of clubs or organizations: Not on file    Relationship status: Not on file  . Intimate partner violence:    Fear of current or ex partner: Not on file    Emotionally abused: Not on file    Physically abused: Not on file    Forced sexual activity: Not on file  Other Topics Concern  . Not on file  Social History Narrative  . Not on file      Review of Systems  All other systems reviewed and are negative.      Objective:   Physical Exam  Constitutional: He is oriented to person, place, and time. He appears well-developed and well-nourished. No distress.  HENT:  Head: Normocephalic and atraumatic.  Right Ear: External ear normal.  Left Ear: External ear normal.  Nose: Nose normal.  Mouth/Throat: Oropharynx is clear and moist. No oropharyngeal exudate.  Eyes: Pupils are equal, round, and reactive to light. Conjunctivae and EOM are normal. Right eye exhibits no discharge. Left eye exhibits no discharge. No scleral  icterus.  Neck: Neck supple. No JVD present. No tracheal deviation present. No thyromegaly present.  Cardiovascular: Normal rate, regular rhythm, normal heart sounds and intact distal pulses. Exam reveals no gallop and no friction rub.  No murmur heard. Pulmonary/Chest: Effort normal and breath sounds normal. No stridor. No respiratory distress. He has no wheezes. He has no rales.  Abdominal: Soft. Bowel sounds are normal. He exhibits no distension and no mass. There is no tenderness. There is no rebound and no guarding.  Musculoskeletal: He exhibits no edema.  Lymphadenopathy:  He has no cervical adenopathy.  Neurological: He is alert and oriented to person, place, and time. He has normal reflexes. He displays normal reflexes. No cranial nerve deficit. He exhibits normal muscle tone. Coordination normal.  Skin: No rash noted. He is not diaphoretic.  Psychiatric: He has a normal mood and affect. His behavior is normal. Judgment and thought content normal.  Vitals reviewed.         Assessment & Plan:  Mammalian Meat Allergy - Plan: Alpha-Gal Panel  General medical exam - Plan: CBC with Differential/Platelet, COMPLETE METABOLIC PANEL WITH GFR, Lipid panel, PSA  Prostate cancer screening - Plan: PSA  Encounter for hepatitis C screening test for low risk patient - Plan: CANCELED: Hepatitis C Antibody  I will be glad to check an alpha gal panel.  Regarding his physical exam I will check a CBC, CMP, fasting lipid panel, and a PSA.  I recommended hepatitis C screening but the patient politely declined this.  I offer the patient a flu shot, tetanus shot, and Pneumovax 23 because of his smoking and he also politely declined all of this.  He declined HIV screening.  I recommended a colonoscopy or a Cologuard test but the patient declined this as well.  My only concern on his physical today is his tobacco abuse as well as has been elevated blood pressure.  I recommended that he check his blood  pressure several times over the next week and follow with me.  If greater than 140/90, I would start medication.  Strongly encourage smoking cessation based on his borderline pulse oximetry and diminished breath sounds bilaterally

## 2018-01-29 ENCOUNTER — Encounter: Payer: Self-pay | Admitting: Family Medicine

## 2018-01-29 DIAGNOSIS — E785 Hyperlipidemia, unspecified: Secondary | ICD-10-CM | POA: Insufficient documentation

## 2018-01-29 DIAGNOSIS — F17200 Nicotine dependence, unspecified, uncomplicated: Secondary | ICD-10-CM | POA: Insufficient documentation

## 2018-01-30 ENCOUNTER — Other Ambulatory Visit: Payer: Self-pay | Admitting: Family Medicine

## 2018-01-30 MED ORDER — EPINEPHRINE 0.3 MG/0.3ML IJ SOAJ: 0.3000 mg | Freq: Once | INTRAMUSCULAR | 1 refills | Status: DC | PRN

## 2018-01-31 ENCOUNTER — Other Ambulatory Visit: Payer: Self-pay | Admitting: Family Medicine

## 2018-01-31 LAB — CBC WITH DIFFERENTIAL/PLATELET
Basophils Absolute: 164 cells/uL (ref 0–200)
Basophils Relative: 1.5 %
Eosinophils Absolute: 545 cells/uL — ABNORMAL HIGH (ref 15–500)
Eosinophils Relative: 5 %
HCT: 48.7 % (ref 38.5–50.0)
Hemoglobin: 16.6 g/dL (ref 13.2–17.1)
Lymphs Abs: 3913 cells/uL — ABNORMAL HIGH (ref 850–3900)
MCH: 29.4 pg (ref 27.0–33.0)
MCHC: 34.1 g/dL (ref 32.0–36.0)
MCV: 86.3 fL (ref 80.0–100.0)
MPV: 10.6 fL (ref 7.5–12.5)
Monocytes Relative: 7.8 %
Neutro Abs: 5428 cells/uL (ref 1500–7800)
Neutrophils Relative %: 49.8 %
Platelets: 297 10*3/uL (ref 140–400)
RBC: 5.64 10*6/uL (ref 4.20–5.80)
RDW: 13.8 % (ref 11.0–15.0)
Total Lymphocyte: 35.9 %
WBC mixed population: 850 cells/uL (ref 200–950)
WBC: 10.9 10*3/uL — ABNORMAL HIGH (ref 3.8–10.8)

## 2018-01-31 LAB — COMPLETE METABOLIC PANEL WITH GFR
AG Ratio: 1.5 (calc) (ref 1.0–2.5)
ALT: 21 U/L (ref 9–46)
AST: 16 U/L (ref 10–35)
Albumin: 4.1 g/dL (ref 3.6–5.1)
Alkaline phosphatase (APISO): 68 U/L (ref 40–115)
BUN: 8 mg/dL (ref 7–25)
CO2: 26 mmol/L (ref 20–32)
Calcium: 9.3 mg/dL (ref 8.6–10.3)
Chloride: 105 mmol/L (ref 98–110)
Creat: 0.83 mg/dL (ref 0.70–1.33)
GFR, Est African American: 113 mL/min/{1.73_m2} (ref >=60)
GFR, Est Non African American: 98 mL/min/{1.73_m2} (ref >=60)
Globulin: 2.8 g/dL (calc) (ref 1.9–3.7)
Glucose, Bld: 99 mg/dL (ref 65–99)
Potassium: 4.6 mmol/L (ref 3.5–5.3)
Sodium: 142 mmol/L (ref 135–146)
Total Bilirubin: 0.4 mg/dL (ref 0.2–1.2)
Total Protein: 6.9 g/dL (ref 6.1–8.1)

## 2018-01-31 LAB — ALPHA-GAL PANEL
Beef IgE: 25.7 kU/L — ABNORMAL HIGH (ref <0.35)
Class: 3
Class: 4
Class: 4
Galactose-alpha-1,3-galactose IgE: 49.7 kU/L — ABNORMAL HIGH (ref <0.10)
LAMB/MUTTON IGE: 14.9 kU/L — ABNORMAL HIGH (ref <0.35)
Pork IgE: 18.5 kU/L — ABNORMAL HIGH (ref <0.35)

## 2018-01-31 LAB — LIPID PANEL
Cholesterol: 204 mg/dL — ABNORMAL HIGH (ref <200)
HDL: 31 mg/dL — ABNORMAL LOW (ref >40)
LDL Cholesterol (Calc): 144 mg/dL (calc) — ABNORMAL HIGH
Non-HDL Cholesterol (Calc): 173 mg/dL (calc) — ABNORMAL HIGH (ref <130)
Total CHOL/HDL Ratio: 6.6 (calc) — ABNORMAL HIGH (ref <5.0)
Triglycerides: 156 mg/dL — ABNORMAL HIGH (ref <150)

## 2018-01-31 LAB — PSA: PSA: 0.4 ng/mL (ref <=4.0)

## 2018-01-31 MED ORDER — ROSUVASTATIN CALCIUM 20 MG PO TABS: 20.0000 mg | ORAL_TABLET | Freq: Every day | ORAL | 0 refills | Status: DC

## 2018-04-19 ENCOUNTER — Ambulatory Visit (INDEPENDENT_AMBULATORY_CARE_PROVIDER_SITE_OTHER): Payer: Managed Care, Other (non HMO) | Admitting: Family Medicine

## 2018-04-19 ENCOUNTER — Encounter: Payer: Self-pay | Admitting: Family Medicine

## 2018-04-19 VITALS — BP 154/100 | HR 76 | Temp 97.9°F | Resp 18 | Ht 65.0 in | Wt 218.0 lb

## 2018-04-19 DIAGNOSIS — R079 Chest pain, unspecified: Secondary | ICD-10-CM | POA: Diagnosis not present

## 2018-04-19 DIAGNOSIS — I209 Angina pectoris, unspecified: Secondary | ICD-10-CM

## 2018-04-19 MED ORDER — METOPROLOL SUCCINATE ER 25 MG PO TB24: 25.0000 mg | ORAL_TABLET | Freq: Every day | ORAL | 3 refills | Status: DC

## 2018-04-19 MED ORDER — NITROGLYCERIN 0.4 MG SL SUBL: 0.4000 mg | SUBLINGUAL_TABLET | SUBLINGUAL | 3 refills | Status: AC | PRN

## 2018-04-19 NOTE — Progress Notes (Signed)
Subjective:    Patient ID: Jimmy Collins, male    DOB: Feb 01, 1961, 58 y.o.   MRN: [id number]  HPI  Patient states that over the last several weeks, at least 3 weeks, he has developed chest pain with activity.  He states if he walks up several flights of steps he will develop a pressure-like pain in the center of his chest that will radiate down his left arm.  He will also develop shortness of breath.  If he stops and rests the pain goes away quickly.  He denies any chest pain or chest pressure at rest but he does report worsening dyspnea on exertion.  EKG today in clinic shows normal sinus rhythm with a right bundle branch block and nonspecific ST segment changes in the lateral leads.  There is no overt sign of ischemia or infarction.  Patient denies any symptoms of unstable angina.  He is chest pain-free at the present time.  He is compliant with Crestor 20 mg a day.  However he is not taking anything for his blood pressure.  He is not taking aspirin.  He continues to smoke. Past Medical History:  Diagnosis Date  . 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  Alpha-gal allergy  . Dyslipidemia   . GERD (gastroesophageal reflux disease)   . Kidney stones   . [facility]   . Smoker    Past Surgical History:  Procedure Laterality Date  . KIDNEY STONE SURGERY     bilateral  . KNEE SURGERY     left   Current Outpatient Medications on File Prior to Visit  Medication Sig Dispense Refill  . EPINEPHrine (EPIPEN 2-PAK) 0.3 mg/0.3 mL IJ SOAJ injection Inject 0.3 mLs (0.3 mg total) into the muscle once as needed (for severe allergic reaction). 1 Device 1  . lansoprazole (PREVACID) 30 MG capsule Take 30 mg by mouth daily.      . rosuvastatin (CRESTOR) 20 MG tablet Take 1 tablet (20 mg total) by mouth daily. 90 tablet 0   No current facility-administered medications on file prior to visit.    Allergies  Allergen Reactions  . Other Hives, Itching  and Swelling    Mammalian Meat Allergy   Social History   Socioeconomic History  . Marital status: Married    Spouse name: Not on file  . Number of children: Not on file  . Years of education: Not on file  . Highest education level: Not on file  Occupational History  . Not on file  Social Needs  . Financial resource strain: Not on file  . Food insecurity:    Worry: Not on file    Inability: Not on file  . Transportation needs:    Medical: Not on file    Non-medical: Not on file  Tobacco Use  . Smoking status: Current Every Day Smoker    Packs/day: 1.00  . Smokeless tobacco: Never Used  Substance and Sexual Activity  . Alcohol use: No  . Drug use: Never  . Sexual activity: Not on file  Lifestyle  . Physical activity:    Days per week: Not on file    Minutes per session: Not on file  . Stress: Not on file  Relationships  . Social connections:    Talks on phone: Not on file    Gets together: Not on file    Attends religious service: Not on file    Active member  of club or organization: Not on file    Attends meetings of clubs or organizations: Not on file    Relationship status: Not on file  . Intimate partner violence:    Fear of current or ex partner: Not on file    Emotionally abused: Not on file    Physically abused: Not on file    Forced sexual activity: Not on file  Other Topics Concern  . Not on file  Social History Narrative  . Not on file      Review of Systems  All other systems reviewed and are negative.      Objective:   Physical Exam  Constitutional: He is oriented to person, place, and time. He appears well-developed and well-nourished. No distress.  HENT:  Head: Normocephalic and atraumatic.  Right Ear: External ear normal.  Left Ear: External ear normal.  Nose: Nose normal.  Mouth/Throat: Oropharynx is clear and moist. No oropharyngeal exudate.  Eyes: Pupils are equal, round, and reactive to light. Conjunctivae and EOM are normal.  Right eye exhibits no discharge. Left eye exhibits no discharge. No scleral icterus.  Neck: Neck supple. No JVD present. No tracheal deviation present. No thyromegaly present.  Cardiovascular: Normal rate, regular rhythm, normal heart sounds and intact distal pulses. Exam reveals no gallop and no friction rub.  No murmur heard. Pulmonary/Chest: Effort normal and breath sounds normal. No stridor. No respiratory distress. He has no wheezes. He has no rales.  Abdominal: Soft. Bowel sounds are normal. He exhibits no distension and no mass. There is no abdominal tenderness. There is no rebound and no guarding.  Musculoskeletal:        General: No edema.  Lymphadenopathy:    He has no cervical adenopathy.  Neurological: He is alert and oriented to person, place, and time. He has normal reflexes. No cranial nerve deficit. He exhibits normal muscle tone. Coordination normal.  Skin: No rash noted. He is not diaphoretic.  Psychiatric: He has a normal mood and affect. His behavior is normal. Judgment and thought content normal.  Vitals reviewed.         Assessment & Plan:  Chest pain, unspecified type - Plan: EKG 12-Lead  Angina pectoris (HCC) - Plan: Ambulatory referral to Cardiology  Patient has new onset stable angina.  Therefore I want to arrange cardiology consultation as soon as possible for most likely catheterization and evaluation.  Meanwhile I want the patient start aspirin 81 mg a day.  Continue Crestor 20 mg a day.  Add Toprol-XL 25 mg a day due to his elevated blood pressure as well as for cardioprotective effects.  Strongly, strongly, strongly encourage smoking cessation.  Gave the patient a prescription for nitroglycerin.  Gave him instructions on how to take the nitroglycerin and when to take the nitroglycerin.  He is to call 911 immediately if he develops chest pain at rest.  He is to call 911 if he develops exertional chest pain that does not resolve with rest and/or nitroglycerin.   Otherwise will arrange urgent outpatient evaluation by cardiology.  Strongly advised the patient to avoid any strenuous activity until evaluated further.

## 2018-04-22 ENCOUNTER — Telehealth: Payer: Self-pay | Admitting: Family Medicine

## 2018-04-22 NOTE — Telephone Encounter (Signed)
Pt has not taken any goody powders all weekend and is hurting all over and would like to know what he can take for the pain since he is no longer taking the goodys?  Also he needs a work note with specifics about what he can and can not do  - ie. not lift anything over 10 pounds etc. 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

## 2018-04-22 NOTE — Telephone Encounter (Signed)
Tylenol for pain.  Unable to lift over 10 lbs.

## 2018-04-23 ENCOUNTER — Ambulatory Visit: Payer: BLUE CROSS/BLUE SHIELD | Admitting: Cardiovascular Disease

## 2018-04-23 ENCOUNTER — Encounter: Payer: Self-pay | Admitting: Family Medicine

## 2018-04-23 NOTE — Telephone Encounter (Signed)
Letter faxed to work place to [employer] at [phone] and received confirmation

## 2018-04-30 ENCOUNTER — Ambulatory Visit: Payer: Managed Care, Other (non HMO) | Admitting: Cardiovascular Disease

## 2018-04-30 ENCOUNTER — Encounter: Payer: Self-pay | Admitting: Cardiovascular Disease

## 2018-04-30 ENCOUNTER — Other Ambulatory Visit: Payer: Self-pay

## 2018-04-30 VITALS — BP 130/85 | HR 75 | Ht 65.0 in | Wt 216.4 lb

## 2018-04-30 DIAGNOSIS — I200 Unstable angina: Secondary | ICD-10-CM

## 2018-04-30 DIAGNOSIS — Z8249 Family history of ischemic heart disease and other diseases of the circulatory system: Secondary | ICD-10-CM

## 2018-04-30 DIAGNOSIS — I10 Essential (primary) hypertension: Secondary | ICD-10-CM | POA: Diagnosis not present

## 2018-04-30 DIAGNOSIS — F17200 Nicotine dependence, unspecified, uncomplicated: Secondary | ICD-10-CM | POA: Diagnosis not present

## 2018-04-30 DIAGNOSIS — E785 Hyperlipidemia, unspecified: Secondary | ICD-10-CM | POA: Diagnosis not present

## 2018-04-30 DIAGNOSIS — Z01812 Encounter for preprocedural laboratory examination: Secondary | ICD-10-CM

## 2018-04-30 MED ORDER — DIPHENHYDRAMINE HCL 50 MG PO TABS: 50.0000 mg | ORAL_TABLET | Freq: Once | ORAL | 0 refills | Status: DC

## 2018-04-30 MED ORDER — PREDNISONE 50 MG PO TABS: ORAL_TABLET | ORAL | 0 refills | Status: DC

## 2018-04-30 MED ORDER — ASPIRIN EC 81 MG PO TBEC: 81.0000 mg | DELAYED_RELEASE_TABLET | Freq: Once | ORAL | 0 refills | Status: AC

## 2018-04-30 NOTE — Assessment & Plan Note (Signed)
History of 100-pack-year tobacco abuse having quit 2 weeks ago.

## 2018-04-30 NOTE — Assessment & Plan Note (Signed)
Mr. Harts has multiple cardiac risk factors including 80-100-pack-year tobacco abuse recently quit, treated hypertension and hyperlipidemia with strong family history of heart disease who has developed exertional chest pain over the last 4 weeks which is fairly typical reproducible.  He will need left heart cath via right radial approach.The patient understands that risks included but are not limited to stroke (1 in 1000), death (1 in 1000), kidney failure [usually temporary] (1 in 500), bleeding (1 in 200), allergic reaction [possibly serious] (1 in 200). The patient understands and agrees to proceed  He has had anaphylactic episodes which office it is been exposure.  I am going to premedicate him with site Medrol, Benadryl and Pepcid.

## 2018-04-30 NOTE — Progress Notes (Signed)
04/30/2018 Jimmy Collins   03-01-1961  [id number]  Primary Physician Tanya Nones Priscille Heidelberg, MD Primary Cardiologist: Runell Gess MD Nicholes Cal[place], [place]  HPI:  Jimmy Collins is a 58 y.o. moderately overweight married Caucasian male father of 1 child referred by Dr. Tanya Nones for cardiovascular dilation because of new onset effort angina.  He works doing maintenance.  His risk factors include 80-pack-year history tobacco abuse having cut back from 2 packs a day to 1/2 pack a day 2 weeks ago at the request of his PCP.  His risk factors otherwise include treated hypertension, hyperlipidemia and family history with a father who had a myocardial infarction and a brother who is had stents.  His brother is my patient and I placed the stents.  He also has a hiatal hernia with GERD.  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  He has had chest pain for 4 weeks which is typically exertional with pain down both arms and shortness of breath.   Current Meds  Medication Sig  . EPINEPHrine (EPIPEN 2-PAK) 0.3 mg/0.3 mL IJ SOAJ injection Inject 0.3 mLs (0.3 mg total) into the muscle once as needed (for severe allergic reaction).  . lansoprazole (PREVACID) 30 MG capsule Take 30 mg by mouth daily.    . metoprolol succinate (TOPROL-XL) 25 MG 24 hr tablet Take 1 tablet (25 mg total) by mouth daily.  . nitroGLYCERIN (NITROSTAT) 0.4 MG SL tablet Place 1 tablet (0.4 mg total) under the tongue every 5 (five) minutes as needed for chest pain.  . rosuvastatin (CRESTOR) 20 MG tablet Take 1 tablet (20 mg total) by mouth daily.     Allergies  Allergen Reactions  . Other Hives, Itching and Swelling    Mammalian Meat Allergy    Social History   Socioeconomic History  . Marital status: Married    Spouse name: Not on file  . Number of children: Not on file  . Years of education: Not on file  . Highest education level: Not on file  Occupational History  . Not  on file  Social Needs  . Financial resource strain: Not on file  . Food insecurity:    Worry: Not on file    Inability: Not on file  . Transportation needs:    Medical: Not on file    Non-medical: Not on file  Tobacco Use  . Smoking status: Current Every Day Smoker    Packs/day: 1.00  . Smokeless tobacco: Never Used  Substance and Sexual Activity  . Alcohol use: No  . Drug use: Never  . Sexual activity: Not on file  Lifestyle  . Physical activity:    Days per week: Not on file    Minutes per session: Not on file  . Stress: Not on file  Relationships  . Social connections:    Talks on phone: Not on file    Gets together: Not on file    Attends religious service: Not on file    Active member of club or organization: Not on file    Attends meetings of clubs or organizations: Not on file    Relationship status: Not on file  . Intimate partner violence:    Fear of current or ex partner: Not on file    Emotionally abused: Not on file    Physically abused: Not on file    Forced sexual activity: Not on file  Other Topics Concern  .  Not on file  Social History Narrative  . Not on file     Review of Systems: General: negative for chills, fever, night sweats or weight changes.  Cardiovascular: negative for chest pain, dyspnea on exertion, edema, orthopnea, palpitations, paroxysmal nocturnal dyspnea or shortness of breath Dermatological: negative for rash Respiratory: negative for cough or wheezing Urologic: negative for hematuria Abdominal: negative for nausea, vomiting, diarrhea, bright red blood per rectum, melena, or hematemesis Neurologic: negative for visual changes, syncope, or dizziness All other systems reviewed and are otherwise negative except as noted above.    Blood pressure 130/85, pulse 75, height 5\' 5"  (1.651 m), weight 216 lb 6.4 oz (98.2 kg), SpO2 95 %.  General appearance: alert and no distress Neck: no adenopathy, no carotid bruit, no JVD, supple,  symmetrical, trachea midline and thyroid not enlarged, symmetric, no tenderness/mass/nodules Lungs: clear to auscultation bilaterally Heart: regular rate and rhythm, S1, S2 normal, no murmur, click, rub or gallop Extremities: extremities normal, atraumatic, no cyanosis or edema Pulses: 2+ and symmetric Skin: Skin color, texture, turgor normal. No rashes or lesions Neurologic: Alert and oriented X 3, normal strength and tone. Normal symmetric reflexes. Normal coordination and gait  EKG not performed today  ASSESSMENT AND PLAN:   Dyslipidemia History of dyslipidemia on statin therapy with lipid profile performed 01/28/2018 revealing a total cholesterol of 204, LDL of 144 and HDL 31.  Smoker History of 100-pack-year tobacco abuse having quit 2 weeks ago.  Essential hypertension History of essential hypertension her blood pressure measured today at 130/85.  He is on metoprolol.  Family history of heart disease History of family history of heart disease with father who had a myocardial infarction and brother who is also a patient of mine who has stents.  Unstable angina [facility]) [name] has multiple cardiac risk factors including 80-100-pack-year tobacco abuse recently quit, treated hypertension and hyperlipidemia with strong family history of heart disease who has developed exertional chest pain over the last 4 weeks which is fairly typical reproducible.  He will need left heart cath via right radial approach.The patient understands that risks included but are not limited to stroke (1 in 1000), death (1 in 1000), kidney failure [usually temporary] (1 in 500), bleeding (1 in 200), allergic reaction [possibly serious] (1 in 200). The patient understands and agrees to proceed  He has had anaphylactic episodes which office it is been exposure.  I am going to premedicate him with site Medrol, Benadryl and Pepcid.      Runell Gess MD FACP,FACC,FAHA, [facility] 04/30/2018 12:02 PM

## 2018-04-30 NOTE — Assessment & Plan Note (Signed)
History of dyslipidemia on statin therapy with lipid profile performed 01/28/2018 revealing a total cholesterol of 204, LDL of 144 and HDL 31.

## 2018-04-30 NOTE — Patient Instructions (Addendum)
    [facility] CARDIOVASCULAR DIVISION [facility] [address] [place] [place] [address] Dept: [phone] Loc: [phone]  CORDAE CORNELIA  04/30/2018  You are scheduled for a Cardiac Catheterization on Thursday, February 20 with Dr. Nanetta Batty.  1. Please arrive at the [facility] (Main Entrance A) at [facility]: [address] [place], [place] [address] at 1:00 pm (This time is two hours before your procedure to ensure your preparation). Free valet parking service is available.   Special note: Every effort is made to have your procedure done on time. Please understand that emergencies sometimes delay scheduled procedures.  2. Diet: Do not eat solid foods after midnight.  The patient may have clear liquids until 5am upon the day of the procedure.  3. Labs: You will need to have blood drawn today: CBC, BMP  4. Medication instructions in preparation for your procedure:  ? Patient will need a prescription for Prednisone and very clear instructions (as follows): 1. Prednisone 50 mg - take 13 hours prior to test 2. Take another Prednisone 50 mg 7 hours prior to test 3. Take another Prednisone 50 mg 1 hour prior to test 4. Take Benadryl 50 mg 1 hour prior to test . Patient must complete all four doses of above prophylactic medications. . Patient will need a ride after test due to Benadryl.   On the morning of your procedure, take your Aspirin and any morning medicines NOT listed above.  You may use sips of water.  5. Plan for one night stay--bring personal belongings. 6. Bring a current list of your medications and current insurance cards. 7. You MUST have a responsible person to drive you home. 8. Someone MUST be with you the first 24 hours after you arrive home or your discharge will be delayed. 9. Please wear clothes that are easy to get on and off and wear slip-on shoes.  Thank you for allowing [place] to care for  you!   -- [facility]  Follow Up:  Follow up in 3 weeks after procedure

## 2018-04-30 NOTE — H&P (View-Only) (Signed)
   04/30/2018 Parvin R Hoskin   12/10/1960  [id number]  Primary Physician Pickard, Warren T, MD Primary Cardiologist: Deejay Koppelman J Shelaine Frie MD FACP, FACC, FAHA, FSCAI  HPI:  Jimmy Collins is a 58 y.o. moderately overweight married Caucasian male father of 1 child referred by Dr. Pickard for cardiovascular dilation because of new onset effort angina.  He works doing maintenance.  His risk factors include 80-pack-year history tobacco abuse having cut back from 2 packs a day to 1/2 pack a day 2 weeks ago at the request of his PCP.  His risk factors otherwise include treated hypertension, hyperlipidemia and family history with a father who had a myocardial infarction and a brother who is had stents.  His brother is my patient and I placed the stents.  He also has a hiatal hernia with GERD.  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  He has had chest pain for 4 weeks which is typically exertional with pain down both arms and shortness of breath.   Current Meds  Medication Sig  . EPINEPHrine (EPIPEN 2-PAK) 0.3 mg/0.3 mL IJ SOAJ injection Inject 0.3 mLs (0.3 mg total) into the muscle once as needed (for severe allergic reaction).  . lansoprazole (PREVACID) 30 MG capsule Take 30 mg by mouth daily.    . metoprolol succinate (TOPROL-XL) 25 MG 24 hr tablet Take 1 tablet (25 mg total) by mouth daily.  . nitroGLYCERIN (NITROSTAT) 0.4 MG SL tablet Place 1 tablet (0.4 mg total) under the tongue every 5 (five) minutes as needed for chest pain.  . rosuvastatin (CRESTOR) 20 MG tablet Take 1 tablet (20 mg total) by mouth daily.     Allergies  Allergen Reactions  . Other Hives, Itching and Swelling    Mammalian Meat Allergy    Social History   Socioeconomic History  . Marital status: Married    Spouse name: Not on file  . Number of children: Not on file  . Years of education: Not on file  . Highest education level: Not on file  Occupational History  . Not  on file  Social Needs  . Financial resource strain: Not on file  . Food insecurity:    Worry: Not on file    Inability: Not on file  . Transportation needs:    Medical: Not on file    Non-medical: Not on file  Tobacco Use  . Smoking status: Current Every Day Smoker    Packs/day: 1.00  . Smokeless tobacco: Never Used  Substance and Sexual Activity  . Alcohol use: No  . Drug use: Never  . Sexual activity: Not on file  Lifestyle  . Physical activity:    Days per week: Not on file    Minutes per session: Not on file  . Stress: Not on file  Relationships  . Social connections:    Talks on phone: Not on file    Gets together: Not on file    Attends religious service: Not on file    Active member of club or organization: Not on file    Attends meetings of clubs or organizations: Not on file    Relationship status: Not on file  . Intimate partner violence:    Fear of current or ex partner: Not on file    Emotionally abused: Not on file    Physically abused: Not on file    Forced sexual activity: Not on file  Other Topics Concern  .   Not on file  Social History Narrative  . Not on file     Review of Systems: General: negative for chills, fever, night sweats or weight changes.  Cardiovascular: negative for chest pain, dyspnea on exertion, edema, orthopnea, palpitations, paroxysmal nocturnal dyspnea or shortness of breath Dermatological: negative for rash Respiratory: negative for cough or wheezing Urologic: negative for hematuria Abdominal: negative for nausea, vomiting, diarrhea, bright red blood per rectum, melena, or hematemesis Neurologic: negative for visual changes, syncope, or dizziness All other systems reviewed and are otherwise negative except as noted above.    Blood pressure 130/85, pulse 75, height 5\' 5"  (1.651 m), weight 216 lb 6.4 oz (98.2 kg), SpO2 95 %.  General appearance: alert and no distress Neck: no adenopathy, no carotid bruit, no JVD, supple,  symmetrical, trachea midline and thyroid not enlarged, symmetric, no tenderness/mass/nodules Lungs: clear to auscultation bilaterally Heart: regular rate and rhythm, S1, S2 normal, no murmur, click, rub or gallop Extremities: extremities normal, atraumatic, no cyanosis or edema Pulses: 2+ and symmetric Skin: Skin color, texture, turgor normal. No rashes or lesions Neurologic: Alert and oriented X 3, normal strength and tone. Normal symmetric reflexes. Normal coordination and gait  EKG not performed today  ASSESSMENT AND PLAN:   Dyslipidemia History of dyslipidemia on statin therapy with lipid profile performed 01/28/2018 revealing a total cholesterol of 204, LDL of 144 and HDL 31.  Smoker History of 100-pack-year tobacco abuse having quit 2 weeks ago.  Essential hypertension History of essential hypertension her blood pressure measured today at 130/85.  He is on metoprolol.  Family history of heart disease History of family history of heart disease with father who had a myocardial infarction and brother who is also a patient of mine who has stents.  Unstable angina [facility]) [name] has multiple cardiac risk factors including 80-100-pack-year tobacco abuse recently quit, treated hypertension and hyperlipidemia with strong family history of heart disease who has developed exertional chest pain over the last 4 weeks which is fairly typical reproducible.  He will need left heart cath via right radial approach.The patient understands that risks included but are not limited to stroke (1 in 1000), death (1 in 1000), kidney failure [usually temporary] (1 in 500), bleeding (1 in 200), allergic reaction [possibly serious] (1 in 200). The patient understands and agrees to proceed  He has had anaphylactic episodes which office it is been exposure.  I am going to premedicate him with site Medrol, Benadryl and Pepcid.      Runell Gess MD FACP,FACC,FAHA, [facility] 04/30/2018 12:02 PM

## 2018-04-30 NOTE — Assessment & Plan Note (Signed)
History of family history of heart disease with father who had a myocardial infarction and brother who is also a patient of mine who has stents.

## 2018-04-30 NOTE — Assessment & Plan Note (Signed)
History of essential hypertension her blood pressure measured today at 130/85.  He is on metoprolol.

## 2018-05-01 LAB — BASIC METABOLIC PANEL
BUN/Creatinine Ratio: 10 (ref 9–20)
BUN: 9 mg/dL (ref 6–24)
CO2: 24 mmol/L (ref 20–29)
Calcium: 9.7 mg/dL (ref 8.7–10.2)
Chloride: 101 mmol/L (ref 96–106)
Creatinine, Ser: 0.89 mg/dL (ref 0.76–1.27)
GFR calc Af Amer: 110 mL/min/{1.73_m2} (ref >59)
GFR calc non Af Amer: 95 mL/min/{1.73_m2} (ref >59)
Glucose: 82 mg/dL (ref 65–99)
Potassium: 4.5 mmol/L (ref 3.5–5.2)
Sodium: 141 mmol/L (ref 134–144)

## 2018-05-01 LAB — CBC
Hematocrit: 48.1 % (ref 37.5–51.0)
Hemoglobin: 16.8 g/dL (ref 13.0–17.7)
MCH: 29.6 pg (ref 26.6–33.0)
MCHC: 34.9 g/dL (ref 31.5–35.7)
MCV: 85 fL (ref 79–97)
Platelets: 299 10*3/uL (ref 150–450)
RBC: 5.67 x10E6/uL (ref 4.14–5.80)
RDW: 13.6 % (ref 11.6–15.4)
WBC: 11.9 10*3/uL — ABNORMAL HIGH (ref 3.4–10.8)

## 2018-05-02 ENCOUNTER — Encounter (HOSPITAL_COMMUNITY)
Admission: AD | Disposition: A | Discharge status: Home / Self Care | Payer: Self-pay | Source: Home / Self Care | Attending: Cardiothoracic Surgery

## 2018-05-02 ENCOUNTER — Other Ambulatory Visit: Payer: Self-pay

## 2018-05-02 ENCOUNTER — Inpatient Hospital Stay (HOSPITAL_COMMUNITY)
Admission: AD | Admit: 2018-05-02 | Discharge: 2018-05-10 | DRG: 234 | Disposition: A | Discharge status: Home / Self Care | Payer: Managed Care, Other (non HMO) | Attending: Cardiothoracic Surgery | Admitting: Cardiothoracic Surgery

## 2018-05-02 ENCOUNTER — Other Ambulatory Visit (HOSPITAL_COMMUNITY): Payer: Managed Care, Other (non HMO)

## 2018-05-02 ENCOUNTER — Inpatient Hospital Stay (HOSPITAL_COMMUNITY): Payer: Managed Care, Other (non HMO)

## 2018-05-02 ENCOUNTER — Other Ambulatory Visit (HOSPITAL_COMMUNITY): Payer: Self-pay

## 2018-05-02 ENCOUNTER — Encounter (HOSPITAL_COMMUNITY): Payer: Self-pay | Admitting: Cardiovascular Disease

## 2018-05-02 DIAGNOSIS — Z79899 Other long term (current) drug therapy: Secondary | ICD-10-CM | POA: Diagnosis not present

## 2018-05-02 DIAGNOSIS — F17210 Nicotine dependence, cigarettes, uncomplicated: Secondary | ICD-10-CM | POA: Diagnosis present

## 2018-05-02 DIAGNOSIS — J449 Chronic obstructive pulmonary disease, unspecified: Secondary | ICD-10-CM | POA: Diagnosis present

## 2018-05-02 DIAGNOSIS — E8779 Other fluid overload: Secondary | ICD-10-CM | POA: Diagnosis not present

## 2018-05-02 DIAGNOSIS — I10 Essential (primary) hypertension: Secondary | ICD-10-CM | POA: Diagnosis present

## 2018-05-02 DIAGNOSIS — Z419 Encounter for procedure for purposes other than remedying health state, unspecified: Secondary | ICD-10-CM

## 2018-05-02 DIAGNOSIS — K219 Gastro-esophageal reflux disease without esophagitis: Secondary | ICD-10-CM | POA: Diagnosis present

## 2018-05-02 DIAGNOSIS — Z01810 Encounter for preprocedural cardiovascular examination: Secondary | ICD-10-CM | POA: Diagnosis not present

## 2018-05-02 DIAGNOSIS — Z91013 Allergy to seafood: Secondary | ICD-10-CM | POA: Diagnosis not present

## 2018-05-02 DIAGNOSIS — I25110 Atherosclerotic heart disease of native coronary artery with unstable angina pectoris: Principal | ICD-10-CM | POA: Diagnosis present

## 2018-05-02 DIAGNOSIS — E119 Type 2 diabetes mellitus without complications: Secondary | ICD-10-CM | POA: Diagnosis present

## 2018-05-02 DIAGNOSIS — Z951 Presence of aortocoronary bypass graft: Secondary | ICD-10-CM

## 2018-05-02 DIAGNOSIS — E669 Obesity, unspecified: Secondary | ICD-10-CM | POA: Diagnosis present

## 2018-05-02 DIAGNOSIS — I200 Unstable angina: Secondary | ICD-10-CM | POA: Diagnosis not present

## 2018-05-02 DIAGNOSIS — Z91018 Allergy to other foods: Secondary | ICD-10-CM | POA: Diagnosis not present

## 2018-05-02 DIAGNOSIS — J9811 Atelectasis: Secondary | ICD-10-CM | POA: Diagnosis not present

## 2018-05-02 DIAGNOSIS — I2510 Atherosclerotic heart disease of native coronary artery without angina pectoris: Secondary | ICD-10-CM | POA: Diagnosis present

## 2018-05-02 DIAGNOSIS — Z8249 Family history of ischemic heart disease and other diseases of the circulatory system: Secondary | ICD-10-CM

## 2018-05-02 DIAGNOSIS — E782 Mixed hyperlipidemia: Secondary | ICD-10-CM | POA: Diagnosis not present

## 2018-05-02 DIAGNOSIS — E785 Hyperlipidemia, unspecified: Secondary | ICD-10-CM | POA: Diagnosis present

## 2018-05-02 DIAGNOSIS — R001 Bradycardia, unspecified: Secondary | ICD-10-CM | POA: Diagnosis not present

## 2018-05-02 DIAGNOSIS — I25119 Atherosclerotic heart disease of native coronary artery with unspecified angina pectoris: Secondary | ICD-10-CM

## 2018-05-02 DIAGNOSIS — I2584 Coronary atherosclerosis due to calcified coronary lesion: Secondary | ICD-10-CM | POA: Diagnosis present

## 2018-05-02 DIAGNOSIS — K449 Diaphragmatic hernia without obstruction or gangrene: Secondary | ICD-10-CM | POA: Diagnosis present

## 2018-05-02 DIAGNOSIS — I209 Angina pectoris, unspecified: Secondary | ICD-10-CM | POA: Diagnosis present

## 2018-05-02 DIAGNOSIS — Z6836 Body mass index (BMI) 36.0-36.9, adult: Secondary | ICD-10-CM | POA: Diagnosis not present

## 2018-05-02 DIAGNOSIS — R079 Chest pain, unspecified: Secondary | ICD-10-CM

## 2018-05-02 HISTORY — PX: LEFT HEART CATH AND CORONARY ANGIOGRAPHY: CATH118249

## 2018-05-02 LAB — COMPREHENSIVE METABOLIC PANEL
ALT: 28 U/L (ref 0–44)
AST: 22 U/L (ref 15–41)
Albumin: 3.5 g/dL (ref 3.5–5.0)
Alkaline Phosphatase: 50 U/L (ref 38–126)
Anion gap: 8 (ref 5–15)
BUN: 13 mg/dL (ref 6–20)
CO2: 21 mmol/L — ABNORMAL LOW (ref 22–32)
Calcium: 9.2 mg/dL (ref 8.9–10.3)
Chloride: 107 mmol/L (ref 98–111)
Creatinine, Ser: 1 mg/dL (ref 0.61–1.24)
GFR calc Af Amer: >60 mL/min (ref >60)
GFR calc non Af Amer: >60 mL/min (ref >60)
Glucose, Bld: 257 mg/dL — ABNORMAL HIGH (ref 70–99)
Potassium: 3.5 mmol/L (ref 3.5–5.1)
Sodium: 136 mmol/L (ref 135–145)
Total Bilirubin: 0.4 mg/dL (ref 0.3–1.2)
Total Protein: 6.7 g/dL (ref 6.5–8.1)

## 2018-05-02 LAB — PREPARE RBC (CROSSMATCH)

## 2018-05-02 LAB — PROTIME-INR
INR: 1.17
Prothrombin Time: 14.8 seconds (ref 11.4–15.2)

## 2018-05-02 SURGERY — LEFT HEART CATH AND CORONARY ANGIOGRAPHY
Anesthesia: LOCAL

## 2018-05-02 MED ORDER — SODIUM CHLORIDE 0.9% FLUSH: 3.0000 mL | INTRAVENOUS | Status: DC | PRN

## 2018-05-02 MED ORDER — CHLORHEXIDINE GLUCONATE 4 % EX LIQD
60.0000 mL | Freq: Once | CUTANEOUS | Status: AC
  Administered 2018-05-03: 4 via TOPICAL
  Filled 2018-05-02: qty 15

## 2018-05-02 MED ORDER — VERAPAMIL HCL 2.5 MG/ML IV SOLN
INTRAVENOUS | Status: AC
  Filled 2018-05-02: qty 2

## 2018-05-02 MED ORDER — SODIUM CHLORIDE 0.9 % IV SOLN: 250.0000 mL | INTRAVENOUS | Status: DC | PRN

## 2018-05-02 MED ORDER — METOPROLOL SUCCINATE ER 25 MG PO TB24
25.0000 mg | ORAL_TABLET | Freq: Every day | ORAL | Status: DC
  Administered 2018-05-03: 25 mg via ORAL
  Filled 2018-05-02: qty 1

## 2018-05-02 MED ORDER — CHLORHEXIDINE GLUCONATE 4 % EX LIQD
60.0000 mL | Freq: Once | CUTANEOUS | Status: AC
  Administered 2018-05-02: 4 via TOPICAL
  Filled 2018-05-02: qty 60

## 2018-05-02 MED ORDER — VANCOMYCIN HCL 10 G IV SOLR
1500.0000 mg | INTRAVENOUS | Status: AC
  Administered 2018-05-03: 1500 mg via INTRAVENOUS
  Filled 2018-05-02: qty 1500

## 2018-05-02 MED ORDER — SODIUM CHLORIDE 0.9 % IV SOLN
1.5000 g | INTRAVENOUS | Status: DC
  Filled 2018-05-02: qty 1.5

## 2018-05-02 MED ORDER — SODIUM CHLORIDE 0.9% FLUSH
3.0000 mL | Freq: Two times a day (BID) | INTRAVENOUS | Status: DC
  Administered 2018-05-02 – 2018-05-03 (×2): 3 mL via INTRAVENOUS

## 2018-05-02 MED ORDER — PHENYLEPHRINE HCL-NACL 20-0.9 MG/250ML-% IV SOLN
30.0000 ug/min | INTRAVENOUS | Status: AC
  Administered 2018-05-03: 60 ug/min via INTRAVENOUS
  Administered 2018-05-03: 25 ug/min via INTRAVENOUS
  Filled 2018-05-02 (×2): qty 250

## 2018-05-02 MED ORDER — TRANEXAMIC ACID (OHS) BOLUS VIA INFUSION
15.0000 mg/kg | INTRAVENOUS | Status: AC
  Administered 2018-05-03: 1470 mg via INTRAVENOUS
  Filled 2018-05-02: qty 1470

## 2018-05-02 MED ORDER — ASPIRIN 81 MG PO CHEW
81.0000 mg | CHEWABLE_TABLET | Freq: Once | ORAL | Status: AC
  Administered 2018-05-02: 81 mg via ORAL
  Filled 2018-05-02: qty 1

## 2018-05-02 MED ORDER — ALPRAZOLAM 0.25 MG PO TABS: 0.2500 mg | ORAL_TABLET | ORAL | Status: DC | PRN

## 2018-05-02 MED ORDER — SODIUM CHLORIDE 0.9 % IV SOLN
750.0000 mg | INTRAVENOUS | Status: DC
  Filled 2018-05-02: qty 750

## 2018-05-02 MED ORDER — TRANEXAMIC ACID (OHS) PUMP PRIME SOLUTION
2.0000 mg/kg | INTRAVENOUS | Status: DC
  Filled 2018-05-02: qty 1.96

## 2018-05-02 MED ORDER — DOPAMINE-DEXTROSE 3.2-5 MG/ML-% IV SOLN
0.0000 ug/kg/min | INTRAVENOUS | Status: DC
  Filled 2018-05-02 (×2): qty 250

## 2018-05-02 MED ORDER — INSULIN REGULAR(HUMAN) IN NACL 100-0.9 UT/100ML-% IV SOLN
INTRAVENOUS | Status: AC
  Administered 2018-05-03: .5 [IU]/h via INTRAVENOUS
  Filled 2018-05-02: qty 100

## 2018-05-02 MED ORDER — SODIUM CHLORIDE 0.9 % IV SOLN: INTRAVENOUS | Status: AC

## 2018-05-02 MED ORDER — BIVALIRUDIN BOLUS VIA INFUSION - CUPID
INTRAVENOUS | Status: DC | PRN
  Administered 2018-05-02: 73.5 mg via INTRAVENOUS

## 2018-05-02 MED ORDER — CHLORHEXIDINE GLUCONATE 0.12 % MT SOLN
15.0000 mL | Freq: Once | OROMUCOSAL | Status: AC
  Administered 2018-05-03 (×2): 15 mL via OROMUCOSAL
  Filled 2018-05-02: qty 15

## 2018-05-02 MED ORDER — DIAZEPAM 5 MG PO TABS
5.0000 mg | ORAL_TABLET | Freq: Once | ORAL | Status: AC
  Administered 2018-05-03: 5 mg via ORAL
  Filled 2018-05-02: qty 1

## 2018-05-02 MED ORDER — ACETAMINOPHEN 325 MG PO TABS
650.0000 mg | ORAL_TABLET | ORAL | Status: DC | PRN
  Administered 2018-05-02: 650 mg via ORAL
  Filled 2018-05-02 (×2): qty 2

## 2018-05-02 MED ORDER — LIDOCAINE HCL (PF) 1 % IJ SOLN
INTRAMUSCULAR | Status: AC
  Filled 2018-05-02: qty 30

## 2018-05-02 MED ORDER — ROSUVASTATIN CALCIUM 20 MG PO TABS
20.0000 mg | ORAL_TABLET | Freq: Every day | ORAL | Status: DC
  Administered 2018-05-02: 20 mg via ORAL
  Filled 2018-05-02: qty 1

## 2018-05-02 MED ORDER — NOREPINEPHRINE 4 MG/250ML-% IV SOLN
0.0000 ug/min | INTRAVENOUS | Status: DC
  Filled 2018-05-02 (×2): qty 250

## 2018-05-02 MED ORDER — SODIUM CHLORIDE 0.9 % IV SOLN
INTRAVENOUS | Status: DC
  Filled 2018-05-02: qty 30

## 2018-05-02 MED ORDER — MUPIROCIN 2 % EX OINT
1.0000 "application " | TOPICAL_OINTMENT | Freq: Two times a day (BID) | CUTANEOUS | Status: DC
  Filled 2018-05-02: qty 22

## 2018-05-02 MED ORDER — NITROGLYCERIN 1 MG/10 ML FOR IR/CATH LAB
INTRA_ARTERIAL | Status: AC
  Filled 2018-05-02: qty 10

## 2018-05-02 MED ORDER — ASPIRIN 81 MG PO CHEW: 81.0000 mg | CHEWABLE_TABLET | ORAL | Status: DC

## 2018-05-02 MED ORDER — POTASSIUM CHLORIDE 2 MEQ/ML IV SOLN
80.0000 meq | INTRAVENOUS | Status: DC
  Filled 2018-05-02: qty 40

## 2018-05-02 MED ORDER — EPINEPHRINE PF 1 MG/ML IJ SOLN
0.0000 ug/min | INTRAVENOUS | Status: DC
  Filled 2018-05-02: qty 4

## 2018-05-02 MED ORDER — IOHEXOL 350 MG/ML SOLN
INTRAVENOUS | Status: DC | PRN
  Administered 2018-05-02: 65 mL via INTRAVENOUS

## 2018-05-02 MED ORDER — MILRINONE LACTATE IN DEXTROSE 20-5 MG/100ML-% IV SOLN
0.3000 ug/kg/min | INTRAVENOUS | Status: DC
  Filled 2018-05-02: qty 100

## 2018-05-02 MED ORDER — PLASMA-LYTE 148 IV SOLN
INTRAVENOUS | Status: AC
  Administered 2018-05-03: 17:00:00
  Filled 2018-05-02 (×2): qty 1

## 2018-05-02 MED ORDER — LIDOCAINE HCL (PF) 1 % IJ SOLN
INTRAMUSCULAR | Status: DC | PRN
  Administered 2018-05-02: 2 mL

## 2018-05-02 MED ORDER — PLASMA-LYTE 148 IV SOLN
INTRAVENOUS | Status: DC
  Filled 2018-05-02 (×2): qty 2.5

## 2018-05-02 MED ORDER — METOPROLOL TARTRATE 12.5 MG HALF TABLET: 12.5000 mg | ORAL_TABLET | Freq: Once | ORAL | Status: DC

## 2018-05-02 MED ORDER — SODIUM CHLORIDE 0.9 % IV SOLN
250.0000 mL | INTRAVENOUS | Status: DC | PRN
  Administered 2018-05-04: 250 mL via INTRAVENOUS

## 2018-05-02 MED ORDER — SODIUM CHLORIDE 0.9 % WEIGHT BASED INFUSION
3.0000 mL/kg/h | INTRAVENOUS | Status: DC
  Administered 2018-05-02: 3 mL/kg/h via INTRAVENOUS

## 2018-05-02 MED ORDER — ASPIRIN 81 MG PO CHEW
81.0000 mg | CHEWABLE_TABLET | Freq: Every day | ORAL | Status: DC
  Administered 2018-05-03: 81 mg via ORAL
  Filled 2018-05-02: qty 1

## 2018-05-02 MED ORDER — BISACODYL 5 MG PO TBEC: 5.0000 mg | DELAYED_RELEASE_TABLET | Freq: Once | ORAL | Status: DC

## 2018-05-02 MED ORDER — TRANEXAMIC ACID 1000 MG/10ML IV SOLN
1.5000 mg/kg/h | INTRAVENOUS | Status: AC
  Administered 2018-05-03: 1.5 mg/kg/h via INTRAVENOUS
  Filled 2018-05-02: qty 25

## 2018-05-02 MED ORDER — BIVALIRUDIN TRIFLUOROACETATE 250 MG IV SOLR
INTRAVENOUS | Status: AC
  Filled 2018-05-02: qty 250

## 2018-05-02 MED ORDER — VERAPAMIL HCL 2.5 MG/ML IV SOLN
INTRA_ARTERIAL | Status: DC | PRN
  Administered 2018-05-02: 15 mL via INTRA_ARTERIAL

## 2018-05-02 MED ORDER — DEXMEDETOMIDINE HCL IN NACL 400 MCG/100ML IV SOLN
0.1000 ug/kg/h | INTRAVENOUS | Status: AC
  Administered 2018-05-03: .3 ug/kg/h via INTRAVENOUS
  Filled 2018-05-02 (×2): qty 100

## 2018-05-02 MED ORDER — ONDANSETRON HCL 4 MG/2ML IJ SOLN: 4.0000 mg | Freq: Four times a day (QID) | INTRAMUSCULAR | Status: DC | PRN

## 2018-05-02 MED ORDER — SODIUM CHLORIDE 0.9% FLUSH: 3.0000 mL | Freq: Two times a day (BID) | INTRAVENOUS | Status: DC

## 2018-05-02 MED ORDER — MAGNESIUM SULFATE 50 % IJ SOLN
40.0000 meq | INTRAMUSCULAR | Status: DC
  Filled 2018-05-02: qty 9.85

## 2018-05-02 MED ORDER — NITROGLYCERIN IN D5W 200-5 MCG/ML-% IV SOLN
2.0000 ug/min | INTRAVENOUS | Status: DC
  Filled 2018-05-02: qty 250

## 2018-05-02 MED ORDER — TEMAZEPAM 7.5 MG PO CAPS: 15.0000 mg | ORAL_CAPSULE | Freq: Once | ORAL | Status: DC | PRN

## 2018-05-02 MED ORDER — SODIUM CHLORIDE 0.9 % WEIGHT BASED INFUSION: 1.0000 mL/kg/h | INTRAVENOUS | Status: DC

## 2018-05-02 MED ORDER — MORPHINE SULFATE (PF) 2 MG/ML IV SOLN: 2.0000 mg | INTRAVENOUS | Status: DC | PRN

## 2018-05-02 SURGICAL SUPPLY — 13 items
CATH INFINITI 5FR ANG PIGTAIL (CATHETERS) ×2 IMPLANT
CATH OPTITORQUE TIG 4.0 5F (CATHETERS) ×2 IMPLANT
DEVICE RAD COMP TR BAND LRG (VASCULAR PRODUCTS) ×2 IMPLANT
GLIDESHEATH SLEND A-KIT 6F 22G (SHEATH) ×2 IMPLANT
GUIDEWIRE INQWIRE 1.5J.035X260 (WIRE) ×1 IMPLANT
INQWIRE 1.5J .035X260CM (WIRE) ×2
KIT ENCORE 26 ADVANTAGE (KITS) IMPLANT
KIT HEART LEFT (KITS) ×2 IMPLANT
PACK CARDIAC CATHETERIZATION (CUSTOM PROCEDURE TRAY) ×2 IMPLANT
SYR MEDRAD MARK 7 150ML (SYRINGE) ×2 IMPLANT
TRANSDUCER W/STOPCOCK (MISCELLANEOUS) ×2 IMPLANT
TUBING CIL FLEX 10 FLL-RA (TUBING) ×2 IMPLANT
WIRE HI TORQ VERSACORE-J 145CM (WIRE) ×2 IMPLANT

## 2018-05-02 NOTE — Consult Note (Signed)
[address]       [place] [address]             [phone]        Jimmy Collins [facility] Medical Record #[id number] Date of Birth: Jan 22, 1961  Referring: No ref. provider found Primary Care: Donita Brooks, MD Primary Cardiologist:No primary care provider on file.  Chief Complaint:   Chest pain  Patient examined, images of coronary angiograms personally reviewed and counseled with patient and wife  History of Present Illness:     58 year old obese nondiabetic smoker presents with symptoms of unstable angina, mainly exertional.  He was evaluated by [name] who recommended cardiac catheterization which was performed today.  This shows a 95% distal left main stenosis.  There is a moderate proximal 70 to 80% LAD stenosis.  The RCA is dominant and without significant disease.  LV ejection fraction is normal.  LVEDP is 14.  The patient is currently stable in the Cath Lab holding area without chest pain in sinus rhythm and with normal blood pressure.  Surgical coronary revascularization is recommended.  Chest x-ray, pre-CABG Dopplers, and blood work is pending.    Current Activity/ Functional Status: Lives with wife, good functional status   Zubrod Score: At the time of surgery this patient's most appropriate activity status/level should be described as: []     0    Normal activity, no symptoms [x]     1    Restricted in physical strenuous activity but ambulatory, able to do out light work []     2    Ambulatory and capable of self care, unable to do work activities, up and about                 more than 50%  Of the time                            []     3    Only limited self care, in bed greater than 50% of waking hours []     4    Completely disabled, no self care, confined to bed or chair []     5    Moribund  Past Medical History:  Diagnosis Date  . 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  Alpha-gal allergy  .  Dyslipidemia   . GERD (gastroesophageal reflux disease)   . Kidney stones   . [facility] spotted fever   . Smoker     Past Surgical History:  Procedure Laterality Date  . KIDNEY STONE SURGERY     bilateral  . KNEE SURGERY     left    Social History   Tobacco Use  Smoking Status Current Every Day Smoker  . Packs/day: 1.00  Smokeless Tobacco Never Used    Social History   Substance and Sexual Activity  Alcohol Use No     Allergies  Allergen Reactions  . Other Hives, Itching and Swelling    Mammalian Meat Allergy  . Shellfish Allergy Hives and Rash    Current Facility-Administered Medications  Medication Dose Route Frequency Provider Last Rate Last Dose  . 0.9 %  sodium chloride infusion  250 mL Intravenous PRN Runell Gess, MD      . 0.9 %  sodium chloride infusion   Intravenous Continuous Runell Gess, MD 75 mL/hr at 05/02/18 1619    .  0.9% sodium chloride infusion  1 mL/kg/hr Intravenous Continuous Runell GessBerry, Jonathan J, MD 98.2 mL/hr at 05/02/18 1404 1 mL/kg/hr at 05/02/18 1404  . [START ON 05/03/2018] aspirin chewable tablet 81 mg  81 mg Oral Pre-Cath Runell GessBerry, Jonathan J, MD      . sodium chloride flush (NS) 0.9 % injection 3 mL  3 mL Intravenous Q12H Runell GessBerry, Jonathan J, MD      . sodium chloride flush (NS) 0.9 % injection 3 mL  3 mL Intravenous PRN Runell GessBerry, Jonathan J, MD        Medications Prior to Admission  Medication Sig Dispense Refill Last Dose  . EPINEPHrine (EPIPEN 2-PAK) 0.3 mg/0.3 mL IJ SOAJ injection Inject 0.3 mLs (0.3 mg total) into the muscle once as needed (for severe allergic reaction). 1 Device 1 Taking  . lansoprazole (PREVACID) 30 MG capsule Take 30 mg by mouth daily.     05/02/2018 at Unknown time  . metoprolol succinate (TOPROL-XL) 25 MG 24 hr tablet Take 1 tablet (25 mg total) by mouth daily. (Patient taking differently: Take 25 mg by mouth daily with lunch. ) 90 tablet 3 05/02/2018 at Unknown time  . nitroGLYCERIN (NITROSTAT) 0.4 MG SL  tablet Place 1 tablet (0.4 mg total) under the tongue every 5 (five) minutes as needed for chest pain. 50 tablet 3 Taking  . rosuvastatin (CRESTOR) 20 MG tablet Take 1 tablet (20 mg total) by mouth daily. (Patient taking differently: Take 20 mg by mouth at bedtime. ) 90 tablet 0 05/01/2018 at Unknown time  . diphenhydrAMINE (BENADRYL) 50 MG tablet Take 1 tablet (50 mg total) by mouth once for 1 dose. Patient will need a prescription for Prednisone and very clear instructions (as follows): Prednisone 50 mg - take 13 hours prior to test Take another Prednisone 50 mg 7 hours prior to test Take another Prednisone 50 mg 1 hour prior to test Take Benadryl 50 mg 1 hour prior to test Patient must complete all four doses of above prophylactic medications. Patient will need a ride after test due to Benadryl. (Patient taking differently: Take 50 mg by mouth once. Take Benadryl 50 mg 1 hour prior to test) 1 tablet 0 05/02/2018 at 1200  . predniSONE (DELTASONE) 50 MG tablet Patient will need a prescription for Prednisone and very clear instructions (as follows): Prednisone 50 mg - take 13 hours prior to test Take another Prednisone 50 mg 7 hours prior to test Take another Prednisone 50 mg 1 hour prior to test Take Benadryl 50 mg 1 hour prior to test Patient must complete all four doses of above prophylactic medications. Patient will need a ride after test due to Benadryl. (Patient taking differently: Take 50 mg by mouth See admin instructions. Patient will need a prescription for Prednisone and very clear instructions (as follows): Prednisone 50 mg - take 13 hours prior to test Take another Prednisone 50 mg 7 hours prior to test Take another Prednisone 50 mg 1 hour prior to test) 3 tablet 0 05/02/2018 at 1200    No family history on file.   Review of Systems:  The patient has had previous left knee surgery without complication.  Patient has history of kidney stones.  No recent respiratory illness,  fever, or influenza.  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    Cardiac Review of Systems: Y or  [    ]= no  Chest Pain [  y  ]  Resting SOB [   ] Exertional  SOB  [  ]  Orthopnea [  ]   Pedal Edema [   ]    Palpitations [  ] Syncope  [  ]   Presyncope [   ]  General Review of Systems: [Y] = yes [  ]=no Constitional: recent weight change [  ]; anorexia [  ]; fatigue [place]  ]; nausea [  ]; night sweats [  ]; fever [  ]; or chills [  ]                                                               Dental: Last Dentist visit: 1 year eye : blurred vision [  ]; diplopia [   ]; vision changes [  ];  Amaurosis fugax[  ]; Resp: cough [  ];  wheezing[  ];  hemoptysis[  ]; shortness of breath[ y ]; paroxysmal nocturnal dyspnea[  ]; dyspnea on exertion[  ]; or orthopnea[  ];  GI:  gallstones[  ], vomiting[  ];  dysphagia[  ]; melena[  ];  hematochezia [  ]; heartburn[ y ];   Hx of  Colonoscopy[  ]; GU: kidney stones [ y ]; hematuria[  ];   dysuria [  ];  nocturia[  ];  history of     obstruction [  ]; urinary frequency [  ]             Skin: rash, swelling[  ];, hair loss[  ];  peripheral edema[  ];  or itching[  ]; Musculosketetal: myalgias[  ];  joint swelling[  ];  joint erythema[  ];  joint pain[ y ];  back pain[  ];  Heme/Lymph: bruising[  ];  bleeding[  ];  anemia[  ];  Neuro: TIA[  ];  headaches[  ];  stroke[  ];  vertigo[  ];  seizures[  ];   paresthesias[  ];  difficulty walking[  ];  Psych:depression[  ]; anxiety[  ];  Endocrine: diabetes[  ];  thyroid dysfunction[  ];                          Physical Exam: BP (!) 151/75   Pulse 79   Temp 98.7 F (37.1 C) (Oral)   Resp (!) 24   Ht 5\' 5"  (1.651 m)   Wt 98 kg   SpO2 94%   BMI 35.94 kg/m        Exam    General- alert and comfortable, resting in Cath Lab holding area    Neck- no JVD, no cervical adenopathy palpable, no carotid bruit   Lungs- clear without rales, wheezes   Cor- regular rate and rhythm, no murmur ,  gallop   Abdomen- soft, non-tender   Extremities - warm, non-tender, minimal edema.  Right wrist has radial artery compression band.  Scar over left knee.   Neuro- oriented, appropriate, no focal weakness    Diagnostic Studies & Laboratory data: Coronary angiogram images personally reviewed showing 95% distal left main stenosis.  Recent Radiology Findings:   No results found. Chest x-ray pending I have independently reviewed the above radiologic studies and discussed with the patient   Recent Lab Findings: Lab Results  Component Value Date   WBC 11.9 (  H) 04/30/2018   HGB 16.8 04/30/2018   HCT 48.1 04/30/2018   PLT 299 04/30/2018   GLUCOSE 82 04/30/2018   CHOL 204 (H) 01/28/2018   TRIG 156 (H) 01/28/2018   HDL 31 (L) 01/28/2018   LDLCALC 144 (H) 01/28/2018   ALT 21 01/28/2018   AST 16 01/28/2018   NA 141 04/30/2018   K 4.5 04/30/2018   CL 101 04/30/2018   CREATININE 0.89 04/30/2018   BUN 9 04/30/2018   CO2 24 04/30/2018      Assessment / Plan:   58 year old obese smoker with symptoms of accelerating angina found to have severe left main stenosis with preserved LV systolic function on cardiac catheterization.  Best therapy is surgical revascularization with bypass graft to the LAD and circumflex marginal.  Will prepare  Patient for surgery tomorrow.  He needs pre-CABG Dopplers, chest x-ray, type and screen and blood work.  He understands the reason for the surgery and the associated risks.      05/02/2018 6:07 PM

## 2018-05-02 NOTE — Progress Notes (Signed)
[name] here, talking w/patient and family.

## 2018-05-02 NOTE — Interval H&P Note (Signed)
Cath Lab Visit (complete for each Cath Lab visit)  Clinical Evaluation Leading to the Procedure:   ACS: No.  Non-ACS:    Anginal Classification: CCS II  Anti-ischemic medical therapy: No Therapy  Non-Invasive Test Results: No non-invasive testing performed  Prior CABG: No previous CABG      History and Physical Interval Note:  05/02/2018 3:25 PM  Jimmy Collins  has presented today for surgery, with the diagnosis of Angina  The various methods of treatment have been discussed with the patient and family. After consideration of risks, benefits and other options for treatment, the patient has consented to  Procedure(s): LEFT HEART CATH AND CORONARY ANGIOGRAPHY (N/A) as a surgical intervention .  The patient's history has been reviewed, patient examined, no change in status, stable for surgery.  I have reviewed the patient's chart and labs.  Questions were answered to the patient's satisfaction.     Nanetta Batty

## 2018-05-02 NOTE — Progress Notes (Signed)
Dr. Alla German here talking w/patient and family

## 2018-05-02 NOTE — Progress Notes (Signed)
Family here. Patient stating he is leaving the hospital. Explained to patient that we need to remove the rt wrist band first. [name] made aware.

## 2018-05-03 ENCOUNTER — Inpatient Hospital Stay (HOSPITAL_COMMUNITY): Payer: Managed Care, Other (non HMO) | Admitting: Certified Registered"

## 2018-05-03 ENCOUNTER — Inpatient Hospital Stay (HOSPITAL_COMMUNITY): Payer: Managed Care, Other (non HMO)

## 2018-05-03 ENCOUNTER — Encounter (HOSPITAL_COMMUNITY)
Admission: AD | Disposition: A | Discharge status: Home / Self Care | Payer: Self-pay | Source: Home / Self Care | Attending: Cardiothoracic Surgery

## 2018-05-03 ENCOUNTER — Encounter (HOSPITAL_COMMUNITY): Payer: Self-pay | Admitting: *Deleted

## 2018-05-03 ENCOUNTER — Other Ambulatory Visit (HOSPITAL_COMMUNITY): Payer: Managed Care, Other (non HMO)

## 2018-05-03 DIAGNOSIS — I200 Unstable angina: Secondary | ICD-10-CM

## 2018-05-03 DIAGNOSIS — Z01810 Encounter for preprocedural cardiovascular examination: Secondary | ICD-10-CM

## 2018-05-03 HISTORY — PX: TEE WITHOUT CARDIOVERSION: SHX5443

## 2018-05-03 HISTORY — PX: CORONARY ARTERY BYPASS GRAFT: SHX141

## 2018-05-03 LAB — BASIC METABOLIC PANEL
Anion gap: 9 (ref 5–15)
BUN: 14 mg/dL (ref 6–20)
CO2: 24 mmol/L (ref 22–32)
Calcium: 8.9 mg/dL (ref 8.9–10.3)
Chloride: 104 mmol/L (ref 98–111)
Creatinine, Ser: 0.89 mg/dL (ref 0.61–1.24)
GFR calc Af Amer: >60 mL/min (ref >60)
GFR calc non Af Amer: >60 mL/min (ref >60)
Glucose, Bld: 158 mg/dL — ABNORMAL HIGH (ref 70–99)
Potassium: 3.2 mmol/L — ABNORMAL LOW (ref 3.5–5.1)
Sodium: 137 mmol/L (ref 135–145)

## 2018-05-03 LAB — SURGICAL PCR SCREEN
MRSA, PCR: NEGATIVE
Staphylococcus aureus: NEGATIVE

## 2018-05-03 LAB — POCT I-STAT 4, (NA,K, GLUC, HGB,HCT)
Glucose, Bld: 86 mg/dL (ref 70–99)
Glucose, Bld: 96 mg/dL (ref 70–99)
Glucose, Bld: 96 mg/dL (ref 70–99)
Glucose, Bld: 87 mg/dL (ref 70–99)
Glucose, Bld: 126 mg/dL — ABNORMAL HIGH (ref 70–99)
Glucose, Bld: 160 mg/dL — ABNORMAL HIGH (ref 70–99)
Glucose, Bld: 122 mg/dL — ABNORMAL HIGH (ref 70–99)
HCT: 41 % (ref 39.0–52.0)
HCT: 41 % (ref 39.0–52.0)
HCT: 42 % (ref 39.0–52.0)
HCT: 32 % — ABNORMAL LOW (ref 39.0–52.0)
HCT: 33 % — ABNORMAL LOW (ref 39.0–52.0)
HCT: 33 % — ABNORMAL LOW (ref 39.0–52.0)
HCT: 32 % — ABNORMAL LOW (ref 39.0–52.0)
Hemoglobin: 13.9 g/dL (ref 13.0–17.0)
Hemoglobin: 13.9 g/dL (ref 13.0–17.0)
Hemoglobin: 14.3 g/dL (ref 13.0–17.0)
Hemoglobin: 10.9 g/dL — ABNORMAL LOW (ref 13.0–17.0)
Hemoglobin: 11.2 g/dL — ABNORMAL LOW (ref 13.0–17.0)
Hemoglobin: 11.2 g/dL — ABNORMAL LOW (ref 13.0–17.0)
Hemoglobin: 10.9 g/dL — ABNORMAL LOW (ref 13.0–17.0)
Potassium: 3.6 mmol/L (ref 3.5–5.1)
Potassium: 3.7 mmol/L (ref 3.5–5.1)
Potassium: 4 mmol/L (ref 3.5–5.1)
Potassium: 3.8 mmol/L (ref 3.5–5.1)
Potassium: 3.8 mmol/L (ref 3.5–5.1)
Potassium: 4.8 mmol/L (ref 3.5–5.1)
Potassium: 3.6 mmol/L (ref 3.5–5.1)
Sodium: 142 mmol/L (ref 135–145)
Sodium: 141 mmol/L (ref 135–145)
Sodium: 141 mmol/L (ref 135–145)
Sodium: 140 mmol/L (ref 135–145)
Sodium: 140 mmol/L (ref 135–145)
Sodium: 139 mmol/L (ref 135–145)
Sodium: 142 mmol/L (ref 135–145)

## 2018-05-03 LAB — POCT I-STAT 7, (LYTES, BLD GAS, ICA,H+H)
Acid-Base Excess: 2 mmol/L (ref 0.0–2.0)
Acid-base deficit: 1 mmol/L (ref 0.0–2.0)
Acid-base deficit: 1 mmol/L (ref 0.0–2.0)
Bicarbonate: 29.3 mmol/L — ABNORMAL HIGH (ref 20.0–28.0)
Bicarbonate: 24.1 mmol/L (ref 20.0–28.0)
Bicarbonate: 23.9 mmol/L (ref 20.0–28.0)
Calcium, Ion: 0.99 mmol/L — ABNORMAL LOW (ref 1.15–1.40)
Calcium, Ion: 1.16 mmol/L (ref 1.15–1.40)
Calcium, Ion: 1.12 mmol/L — ABNORMAL LOW (ref 1.15–1.40)
HCT: 33 % — ABNORMAL LOW (ref 39.0–52.0)
HCT: 29 % — ABNORMAL LOW (ref 39.0–52.0)
HCT: 33 % — ABNORMAL LOW (ref 39.0–52.0)
Hemoglobin: 11.2 g/dL — ABNORMAL LOW (ref 13.0–17.0)
Hemoglobin: 9.9 g/dL — ABNORMAL LOW (ref 13.0–17.0)
Hemoglobin: 11.2 g/dL — ABNORMAL LOW (ref 13.0–17.0)
O2 Saturation: 100 %
O2 Saturation: 84 %
O2 Saturation: 97 %
Potassium: 3.8 mmol/L (ref 3.5–5.1)
Potassium: 3.8 mmol/L (ref 3.5–5.1)
Potassium: 3.7 mmol/L (ref 3.5–5.1)
Sodium: 140 mmol/L (ref 135–145)
Sodium: 141 mmol/L (ref 135–145)
Sodium: 141 mmol/L (ref 135–145)
TCO2: 31 mmol/L (ref 22–32)
TCO2: 25 mmol/L (ref 22–32)
TCO2: 25 mmol/L (ref 22–32)
pCO2 arterial: 55.2 mmHg — ABNORMAL HIGH (ref 32.0–48.0)
pCO2 arterial: 41.7 mmHg (ref 32.0–48.0)
pCO2 arterial: 40.5 mmHg (ref 32.0–48.0)
pH, Arterial: 7.332 — ABNORMAL LOW (ref 7.350–7.450)
pH, Arterial: 7.37 (ref 7.350–7.450)
pH, Arterial: 7.378 (ref 7.350–7.450)
pO2, Arterial: 266 mmHg — ABNORMAL HIGH (ref 83.0–108.0)
pO2, Arterial: 50 mmHg — ABNORMAL LOW (ref 83.0–108.0)
pO2, Arterial: 90 mmHg (ref 83.0–108.0)

## 2018-05-03 LAB — CBC
HCT: 43 % (ref 39.0–52.0)
Hemoglobin: 14.2 g/dL (ref 13.0–17.0)
MCH: 28.3 pg (ref 26.0–34.0)
MCHC: 33 g/dL (ref 30.0–36.0)
MCV: 85.8 fL (ref 80.0–100.0)
Platelets: 275 10*3/uL (ref 150–400)
RBC: 5.01 MIL/uL (ref 4.22–5.81)
RDW: 13.7 % (ref 11.5–15.5)
WBC: 23.4 10*3/uL — ABNORMAL HIGH (ref 4.0–10.5)
nRBC: 0 % (ref 0.0–0.2)

## 2018-05-03 LAB — PULMONARY FUNCTION TEST
FEF 25-75 Pre: 1.76 L/sec
FEF2575-%Pred-Pre: 65 %
FEV1-%Pred-Pre: 73 %
FEV1-Pre: 2.29 L
FEV1FVC-%Pred-Pre: 96 %
FEV6-%Pred-Pre: 78 %
FEV6-Pre: 3.02 L
FEV6FVC-%Pred-Pre: 101 %
FVC-%Pred-Pre: 77 %
FVC-Pre: 3.12 L
Pre FEV1/FVC ratio: 73 %
Pre FEV6/FVC Ratio: 97 %

## 2018-05-03 LAB — BLOOD GAS, ARTERIAL
Acid-Base Excess: 1.1 mmol/L (ref 0.0–2.0)
Bicarbonate: 25.1 mmol/L (ref 20.0–28.0)
Drawn by: 31394
O2 Saturation: 93 %
Patient temperature: 97.9
pCO2 arterial: 38.7 mmHg (ref 32.0–48.0)
pH, Arterial: 7.426 (ref 7.350–7.450)
pO2, Arterial: 63.1 mmHg — ABNORMAL LOW (ref 83.0–108.0)

## 2018-05-03 LAB — ECHOCARDIOGRAM COMPLETE
Height: 65 in
Weight: 3456 oz

## 2018-05-03 LAB — TSH: TSH: 0.554 u[IU]/mL (ref 0.350–4.500)

## 2018-05-03 LAB — ABO/RH: ABO/RH(D): O POS

## 2018-05-03 LAB — HEMOGLOBIN A1C
Hgb A1c MFr Bld: 6 % — ABNORMAL HIGH (ref 4.8–5.6)
Mean Plasma Glucose: 125.5 mg/dL

## 2018-05-03 LAB — HEMOGLOBIN AND HEMATOCRIT, BLOOD
HCT: 35.2 % — ABNORMAL LOW (ref 39.0–52.0)
Hemoglobin: 11.6 g/dL — ABNORMAL LOW (ref 13.0–17.0)

## 2018-05-03 LAB — PLATELET COUNT: Platelets: 216 10*3/uL (ref 150–400)

## 2018-05-03 SURGERY — CORONARY ARTERY BYPASS GRAFTING (CABG)
Anesthesia: General | Site: Chest

## 2018-05-03 MED ORDER — FENTANYL BOLUS VIA INFUSION
50.0000 ug | INTRAVENOUS | Status: DC | PRN
  Filled 2018-05-03: qty 50

## 2018-05-03 MED ORDER — HEPARIN SODIUM (PORCINE) 1000 UNIT/ML IJ SOLN
INTRAMUSCULAR | Status: DC | PRN
  Administered 2018-05-03: 28000 [IU] via INTRAVENOUS
  Administered 2018-05-03: 2000 [IU] via INTRAVENOUS

## 2018-05-03 MED ORDER — FUROSEMIDE 10 MG/ML IJ SOLN
INTRAMUSCULAR | Status: AC
  Filled 2018-05-03: qty 4

## 2018-05-03 MED ORDER — HEMOSTATIC AGENTS (NO CHARGE) OPTIME
TOPICAL | Status: DC | PRN
  Administered 2018-05-03: 1 via TOPICAL

## 2018-05-03 MED ORDER — PROTAMINE SULFATE 10 MG/ML IV SOLN
INTRAVENOUS | Status: AC
  Filled 2018-05-03: qty 25

## 2018-05-03 MED ORDER — PHENYLEPHRINE 40 MCG/ML (10ML) SYRINGE FOR IV PUSH (FOR BLOOD PRESSURE SUPPORT)
PREFILLED_SYRINGE | INTRAVENOUS | Status: DC | PRN
  Administered 2018-05-03: 80 ug via INTRAVENOUS
  Administered 2018-05-03: 40 ug via INTRAVENOUS

## 2018-05-03 MED ORDER — FENTANYL CITRATE (PF) 250 MCG/5ML IJ SOLN
INTRAMUSCULAR | Status: AC
  Filled 2018-05-03: qty 10

## 2018-05-03 MED ORDER — PROPOFOL 10 MG/ML IV BOLUS
INTRAVENOUS | Status: AC
  Filled 2018-05-03: qty 20

## 2018-05-03 MED ORDER — MIDAZOLAM HCL 2 MG/2ML IJ SOLN
INTRAMUSCULAR | Status: AC
  Administered 2018-05-03: 2 mg via INTRAVENOUS
  Filled 2018-05-03: qty 2

## 2018-05-03 MED ORDER — FENTANYL 2500MCG IN NS 250ML (10MCG/ML) PREMIX INFUSION
25.0000 ug/h | INTRAVENOUS | Status: DC
  Administered 2018-05-04: 50 ug/h via INTRAVENOUS
  Filled 2018-05-03: qty 250

## 2018-05-03 MED ORDER — CHLORHEXIDINE GLUCONATE 0.12 % MT SOLN
OROMUCOSAL | Status: AC
  Administered 2018-05-03: 15 mL via OROMUCOSAL
  Filled 2018-05-03: qty 15

## 2018-05-03 MED ORDER — SODIUM CHLORIDE (PF) 0.9 % IJ SOLN
OROMUCOSAL | Status: DC | PRN
  Administered 2018-05-03 (×3): via TOPICAL

## 2018-05-03 MED ORDER — LACTATED RINGERS IV SOLN
INTRAVENOUS | Status: DC | PRN
  Administered 2018-05-03: 16:00:00 via INTRAVENOUS

## 2018-05-03 MED ORDER — ROCURONIUM BROMIDE 50 MG/5ML IV SOSY
PREFILLED_SYRINGE | INTRAVENOUS | Status: AC
  Filled 2018-05-03: qty 5

## 2018-05-03 MED ORDER — FUROSEMIDE 10 MG/ML IJ SOLN
INTRAMUSCULAR | Status: DC | PRN
  Administered 2018-05-03: 10 mg via INTRAMUSCULAR

## 2018-05-03 MED ORDER — PHENYLEPHRINE 40 MCG/ML (10ML) SYRINGE FOR IV PUSH (FOR BLOOD PRESSURE SUPPORT)
PREFILLED_SYRINGE | INTRAVENOUS | Status: AC
  Filled 2018-05-03: qty 10

## 2018-05-03 MED ORDER — SODIUM CHLORIDE (PF) 0.9 % IJ SOLN
INTRAMUSCULAR | Status: AC
  Filled 2018-05-03: qty 10

## 2018-05-03 MED ORDER — SODIUM CHLORIDE 0.9 % IV SOLN
1.5000 g | INTRAVENOUS | Status: AC
  Administered 2018-05-03: 1.5 g via INTRAVENOUS

## 2018-05-03 MED ORDER — 0.9 % SODIUM CHLORIDE (POUR BTL) OPTIME
TOPICAL | Status: DC | PRN
  Administered 2018-05-03: 6000 mL

## 2018-05-03 MED ORDER — MIDAZOLAM HCL 2 MG/2ML IJ SOLN
2.0000 mg | Freq: Once | INTRAMUSCULAR | Status: AC
  Administered 2018-05-03: 2 mg via INTRAVENOUS

## 2018-05-03 MED ORDER — ALBUMIN HUMAN 5 % IV SOLN
INTRAVENOUS | Status: DC | PRN
  Administered 2018-05-03: 23:00:00 via INTRAVENOUS

## 2018-05-03 MED ORDER — NOREPINEPHRINE BITARTRATE 1 MG/ML IV SOLN
INTRAVENOUS | Status: DC | PRN
  Administered 2018-05-03: 5 ug/kg/min via INTRAVENOUS

## 2018-05-03 MED ORDER — SODIUM CHLORIDE 0.9 % IV SOLN
INTRAVENOUS | Status: DC | PRN
  Administered 2018-05-03: 19:00:00 via INTRAVENOUS

## 2018-05-03 MED ORDER — MIDAZOLAM HCL (PF) 10 MG/2ML IJ SOLN
INTRAMUSCULAR | Status: AC
  Filled 2018-05-03: qty 2

## 2018-05-03 MED ORDER — DOPAMINE-DEXTROSE 1.6-5 MG/ML-% IV SOLN
INTRAVENOUS | Status: DC | PRN
  Administered 2018-05-03: 2 ug/kg/min via INTRAVENOUS

## 2018-05-03 MED ORDER — DIPHENHYDRAMINE HCL 50 MG/ML IJ SOLN
INTRAMUSCULAR | Status: DC | PRN
  Administered 2018-05-03: 25 mg via INTRAVENOUS

## 2018-05-03 MED ORDER — HEPARIN SODIUM (PORCINE) 1000 UNIT/ML IJ SOLN
INTRAMUSCULAR | Status: AC
  Filled 2018-05-03: qty 1

## 2018-05-03 MED ORDER — PROTAMINE SULFATE 10 MG/ML IV SOLN
INTRAVENOUS | Status: AC
  Filled 2018-05-03: qty 5

## 2018-05-03 MED ORDER — ARTIFICIAL TEARS OPHTHALMIC OINT
TOPICAL_OINTMENT | OPHTHALMIC | Status: DC | PRN
  Administered 2018-05-03: 1 via OPHTHALMIC

## 2018-05-03 MED ORDER — FENTANYL CITRATE (PF) 250 MCG/5ML IJ SOLN
INTRAMUSCULAR | Status: DC | PRN
  Administered 2018-05-03: 250 ug via INTRAVENOUS
  Administered 2018-05-03: 150 ug via INTRAVENOUS
  Administered 2018-05-03 (×3): 100 ug via INTRAVENOUS
  Administered 2018-05-03 (×2): 150 ug via INTRAVENOUS

## 2018-05-03 MED ORDER — LACTATED RINGERS IV SOLN
INTRAVENOUS | Status: DC | PRN
  Administered 2018-05-03 (×2): via INTRAVENOUS

## 2018-05-03 MED ORDER — FENTANYL CITRATE (PF) 100 MCG/2ML IJ SOLN
INTRAMUSCULAR | Status: AC
  Administered 2018-05-03: 25 ug via INTRAVENOUS
  Filled 2018-05-03: qty 2

## 2018-05-03 MED ORDER — FENTANYL CITRATE (PF) 100 MCG/2ML IJ SOLN
50.0000 ug | Freq: Once | INTRAMUSCULAR | Status: AC
  Administered 2018-05-04: 50 ug via INTRAVENOUS
  Filled 2018-05-03: qty 2

## 2018-05-03 MED ORDER — ALBUTEROL SULFATE HFA 108 (90 BASE) MCG/ACT IN AERS
INHALATION_SPRAY | RESPIRATORY_TRACT | Status: AC
  Filled 2018-05-03: qty 6.7

## 2018-05-03 MED ORDER — ARTIFICIAL TEARS OPHTHALMIC OINT
TOPICAL_OINTMENT | OPHTHALMIC | Status: AC
  Filled 2018-05-03: qty 3.5

## 2018-05-03 MED ORDER — MIDAZOLAM HCL 5 MG/5ML IJ SOLN
INTRAMUSCULAR | Status: DC | PRN
  Administered 2018-05-03: 3 mg via INTRAVENOUS
  Administered 2018-05-03 (×2): 2 mg via INTRAVENOUS
  Administered 2018-05-03: 3 mg via INTRAVENOUS
  Administered 2018-05-04: 4 mg via INTRAVENOUS

## 2018-05-03 MED ORDER — ALBUTEROL SULFATE HFA 108 (90 BASE) MCG/ACT IN AERS
INHALATION_SPRAY | RESPIRATORY_TRACT | Status: DC | PRN
  Administered 2018-05-03 (×2): 5 via RESPIRATORY_TRACT

## 2018-05-03 MED ORDER — PROTAMINE SULFATE 10 MG/ML IV SOLN
INTRAVENOUS | Status: DC | PRN
  Administered 2018-05-03: 300 mg via INTRAVENOUS

## 2018-05-03 MED ORDER — HYDROCORTISONE NA SUCCINATE PF 1000 MG IJ SOLR
INTRAMUSCULAR | Status: DC | PRN
  Administered 2018-05-03: 125 mg via INTRAVENOUS

## 2018-05-03 MED ORDER — VANCOMYCIN HCL 10 G IV SOLR: 1500.0000 mg | INTRAVENOUS | Status: DC

## 2018-05-03 MED ORDER — FENTANYL CITRATE (PF) 100 MCG/2ML IJ SOLN
25.0000 ug | Freq: Once | INTRAMUSCULAR | Status: AC
  Administered 2018-05-03: 25 ug via INTRAVENOUS

## 2018-05-03 MED ORDER — PLASMA-LYTE 148 IV SOLN
INTRAVENOUS | Status: DC | PRN
  Administered 2018-05-03: 500 mL via INTRAVASCULAR

## 2018-05-03 MED ORDER — PROPOFOL 10 MG/ML IV BOLUS
INTRAVENOUS | Status: DC | PRN
  Administered 2018-05-03: 60 mg via INTRAVENOUS

## 2018-05-03 MED ORDER — LACTATED RINGERS IV SOLN
INTRAVENOUS | Status: DC
  Administered 2018-05-03: 16:00:00 via INTRAVENOUS

## 2018-05-03 MED ORDER — ROCURONIUM BROMIDE 10 MG/ML (PF) SYRINGE
PREFILLED_SYRINGE | INTRAVENOUS | Status: DC | PRN
  Administered 2018-05-03 (×3): 50 mg via INTRAVENOUS
  Administered 2018-05-03: 20 mg via INTRAVENOUS
  Administered 2018-05-03: 50 mg via INTRAVENOUS
  Administered 2018-05-03: 30 mg via INTRAVENOUS
  Administered 2018-05-03: 50 mg via INTRAVENOUS

## 2018-05-03 MED FILL — Bivalirudin Trifluoroacetate For IV Soln 250 MG (Base Equiv): INTRAVENOUS | Qty: 250 | Status: AC

## 2018-05-03 SURGICAL SUPPLY — 101 items
ADAPTER CARDIO PERF ANTE/RETRO (ADAPTER) ×4 IMPLANT
APPLICATOR COTTON TIP 6 STRL (MISCELLANEOUS) ×2 IMPLANT
APPLICATOR COTTON TIP 6IN STRL (MISCELLANEOUS) ×4
BAG DECANTER FOR FLEXI CONT (MISCELLANEOUS) ×4 IMPLANT
BANDAGE ACE 4X5 VEL STRL LF (GAUZE/BANDAGES/DRESSINGS) ×8 IMPLANT
BANDAGE ACE 6X5 VEL STRL LF (GAUZE/BANDAGES/DRESSINGS) ×8 IMPLANT
BASKET HEART  (ORDER IN 25'S) (MISCELLANEOUS) ×1
BASKET HEART (ORDER IN 25'S) (MISCELLANEOUS) ×1
BASKET HEART (ORDER IN 25S) (MISCELLANEOUS) ×2 IMPLANT
BLADE CLIPPER SURG (BLADE) IMPLANT
BLADE STERNUM SYSTEM 6 (BLADE) ×4 IMPLANT
BLADE SURG 11 STRL SS (BLADE) ×4 IMPLANT
BLADE SURG 12 STRL SS (BLADE) ×4 IMPLANT
BNDG GAUZE ELAST 4 BULKY (GAUZE/BANDAGES/DRESSINGS) ×8 IMPLANT
CANISTER SUCT 3000ML PPV (MISCELLANEOUS) ×4 IMPLANT
CANNULA GUNDRY RCSP 15FR (MISCELLANEOUS) ×4 IMPLANT
CATH CPB KIT VANTRIGT (MISCELLANEOUS) ×4 IMPLANT
CATH ROBINSON RED A/P 18FR (CATHETERS) ×12 IMPLANT
CATH THORACIC 36FR RT ANG (CATHETERS) ×4 IMPLANT
CLIP VESOCCLUDE MED 24/CT (CLIP) ×8 IMPLANT
CLIP VESOCCLUDE SM WIDE 24/CT (CLIP) ×4 IMPLANT
COVER SURGICAL LIGHT HANDLE (MISCELLANEOUS) ×4 IMPLANT
COVER WAND RF STERILE (DRAPES) ×4 IMPLANT
CRADLE DONUT ADULT HEAD (MISCELLANEOUS) ×4 IMPLANT
DRAIN CHANNEL 32F RND 10.7 FF (WOUND CARE) ×4 IMPLANT
DRAPE CARDIOVASCULAR INCISE (DRAPES) ×2
DRAPE SLUSH/WARMER DISC (DRAPES) ×4 IMPLANT
DRAPE SRG 135X102X78XABS (DRAPES) ×2 IMPLANT
DRSG AQUACEL AG ADV 3.5X14 (GAUZE/BANDAGES/DRESSINGS) ×4 IMPLANT
ELECT BLADE 4.0 EZ CLEAN MEGAD (MISCELLANEOUS) ×4
ELECT BLADE 6.5 EXT (BLADE) ×4 IMPLANT
ELECT CAUTERY BLADE 6.4 (BLADE) ×4 IMPLANT
ELECT REM PT RETURN 9FT ADLT (ELECTROSURGICAL) ×8
ELECTRODE BLDE 4.0 EZ CLN MEGD (MISCELLANEOUS) ×2 IMPLANT
ELECTRODE REM PT RTRN 9FT ADLT (ELECTROSURGICAL) ×4 IMPLANT
FELT TEFLON 1X6 (MISCELLANEOUS) ×4 IMPLANT
GAUZE SPONGE 4X4 12PLY STRL (GAUZE/BANDAGES/DRESSINGS) ×12 IMPLANT
GLOVE BIO SURGEON STRL SZ7.5 (GLOVE) ×12 IMPLANT
GLOVE SURG SS PI 6.0 STRL IVOR (GLOVE) ×24 IMPLANT
GOWN STRL REUS W/ TWL LRG LVL3 (GOWN DISPOSABLE) ×16 IMPLANT
GOWN STRL REUS W/TWL LRG LVL3 (GOWN DISPOSABLE) ×16
HEMOSTAT POWDER SURGIFOAM 1G (HEMOSTASIS) ×12 IMPLANT
HEMOSTAT SURGICEL 2X14 (HEMOSTASIS) ×4 IMPLANT
INSERT FOGARTY XLG (MISCELLANEOUS) IMPLANT
KIT BASIN OR (CUSTOM PROCEDURE TRAY) ×4 IMPLANT
KIT SUCTION CATH 14FR (SUCTIONS) ×8 IMPLANT
KIT TURNOVER KIT B (KITS) ×4 IMPLANT
KIT VASOVIEW HEMOPRO 2 VH 4000 (KITS) ×4 IMPLANT
LEAD PACING MYOCARDI (MISCELLANEOUS) ×4 IMPLANT
MARKER GRAFT CORONARY BYPASS (MISCELLANEOUS) ×12 IMPLANT
NS IRRIG 1000ML POUR BTL (IV SOLUTION) ×24 IMPLANT
PACK E OPEN HEART (SUTURE) ×4 IMPLANT
PACK OPEN HEART (CUSTOM PROCEDURE TRAY) ×4 IMPLANT
PAD ARMBOARD 7.5X6 YLW CONV (MISCELLANEOUS) ×8 IMPLANT
PAD ELECT DEFIB RADIOL ZOLL (MISCELLANEOUS) ×4 IMPLANT
PENCIL BUTTON HOLSTER BLD 10FT (ELECTRODE) ×4 IMPLANT
PUNCH AORTIC ROTATE  4.5MM 8IN (MISCELLANEOUS) ×4 IMPLANT
PUNCH AORTIC ROTATE 4.0MM (MISCELLANEOUS) IMPLANT
PUNCH AORTIC ROTATE 4.5MM 8IN (MISCELLANEOUS) IMPLANT
PUNCH AORTIC ROTATE 5MM 8IN (MISCELLANEOUS) IMPLANT
SET CARDIOPLEGIA MPS 5001102 (MISCELLANEOUS) ×4 IMPLANT
SOLUTION ANTI FOG 6CC (MISCELLANEOUS) ×4 IMPLANT
SPONGE LAP 4X18 RFD (DISPOSABLE) ×8 IMPLANT
STOPCOCK 4 WAY LG BORE MALE ST (IV SETS) ×4 IMPLANT
SURGIFLO W/THROMBIN 8M KIT (HEMOSTASIS) ×8 IMPLANT
SUT BONE WAX W31G (SUTURE) ×4 IMPLANT
SUT MNCRL AB 4-0 PS2 18 (SUTURE) ×8 IMPLANT
SUT PROLENE 3 0 SH DA (SUTURE) IMPLANT
SUT PROLENE 3 0 SH1 36 (SUTURE) IMPLANT
SUT PROLENE 4 0 RB 1 (SUTURE) ×2
SUT PROLENE 4 0 SH DA (SUTURE) ×4 IMPLANT
SUT PROLENE 4-0 RB1 .5 CRCL 36 (SUTURE) ×2 IMPLANT
SUT PROLENE 5 0 C 1 36 (SUTURE) IMPLANT
SUT PROLENE 6 0 C 1 30 (SUTURE) ×8 IMPLANT
SUT PROLENE 6 0 CC (SUTURE) ×24 IMPLANT
SUT PROLENE 8 0 BV175 6 (SUTURE) IMPLANT
SUT PROLENE BLUE 7 0 (SUTURE) ×16 IMPLANT
SUT SILK  1 MH (SUTURE)
SUT SILK 1 MH (SUTURE) IMPLANT
SUT SILK 2 0 SH CR/8 (SUTURE) ×8 IMPLANT
SUT SILK 3 0 SH CR/8 (SUTURE) IMPLANT
SUT STEEL 6MS V (SUTURE) ×8 IMPLANT
SUT STEEL SZ 6 DBL 3X14 BALL (SUTURE) ×4 IMPLANT
SUT VIC AB 1 CTX 36 (SUTURE) ×4
SUT VIC AB 1 CTX36XBRD ANBCTR (SUTURE) ×4 IMPLANT
SUT VIC AB 2-0 CT1 27 (SUTURE) ×4
SUT VIC AB 2-0 CT1 TAPERPNT 27 (SUTURE) ×4 IMPLANT
SUT VIC AB 2-0 CTX 27 (SUTURE) IMPLANT
SUT VIC AB 3-0 X1 27 (SUTURE) IMPLANT
SYSTEM SAHARA CHEST DRAIN ATS (WOUND CARE) ×4 IMPLANT
TAPE CLOTH SURG 4X10 WHT LF (GAUZE/BANDAGES/DRESSINGS) ×4 IMPLANT
TAPE PAPER 2X10 WHT MICROPORE (GAUZE/BANDAGES/DRESSINGS) ×4 IMPLANT
TOWEL GREEN STERILE (TOWEL DISPOSABLE) ×4 IMPLANT
TOWEL GREEN STERILE FF (TOWEL DISPOSABLE) ×4 IMPLANT
TRAY CATH LUMEN 1 20CM STRL (SET/KITS/TRAYS/PACK) ×4 IMPLANT
TRAY FOLEY SLVR 16FR TEMP STAT (SET/KITS/TRAYS/PACK) ×4 IMPLANT
TUBE SUCT INTRACARD DLP 20F (MISCELLANEOUS) ×8 IMPLANT
TUBING ART PRESS 48 MALE/FEM (TUBING) ×8 IMPLANT
TUBING INSUFFLATION (TUBING) ×4 IMPLANT
UNDERPAD 30X30 (UNDERPADS AND DIAPERS) ×4 IMPLANT
WATER STERILE IRR 1000ML POUR (IV SOLUTION) ×8 IMPLANT

## 2018-05-03 NOTE — Brief Op Note (Signed)
05/02/2018 - 05/03/2018  8:50 AM  PATIENT:  Jimmy Collins  58 y.o. male  PRE-OPERATIVE DIAGNOSIS:  CAD LMD  POST-OPERATIVE DIAGNOSIS:  CADLMD  PROCEDURE:  Procedure(s): CORONARY ARTERY BYPASS GRAFTING (CABG), ON PUMP, TIMES TWO, USING LEFT INTERNAL MAMMARY ARTERY AND ENDOSCOPICALLY HARVESTED LEFT GREATER SAPHENOUS VEIN (N/A) TRANSESOPHAGEAL ECHOCARDIOGRAM (TEE) (N/A)  LIMA to LAD SVG to OM1  SURGEON:  Surgeon(s) and Role:    Kerin Perna, MD - Primary  PHYSICIAN ASSISTANT:  Jari Favre, PA-C   ANESTHESIA:   general  EBL:  1125 mL   BLOOD ADMINISTERED:none  DRAINS: routine   LOCAL MEDICATIONS USED:  NONE  SPECIMEN:  No Specimen  DISPOSITION OF SPECIMEN:  N/A  COUNTS:  YES  DICTATION: .Dragon Dictation  PLAN OF CARE: Admit to inpatient   PATIENT DISPOSITION:  ICU - intubated and hemodynamically stable.   Delay start of Pharmacological VTE agent (>24hrs) due to surgical blood loss or risk of bleeding: yes

## 2018-05-03 NOTE — Progress Notes (Signed)
Hibliclens bath given, by Tressia Miners, RN.

## 2018-05-03 NOTE — Progress Notes (Addendum)
The patient has been seen in conjunction with Levora Dredge, MD. All aspects of care have been considered and discussed. The patient has been personally interviewed, examined, and all clinical data has been reviewed.   Digital images have been reviewed.  Agree with plan for CABG.  Pain-free and currently undergoing vascular testing in preparation for CABG later today.   Progress Note  Patient Name: Jimmy Collins Date of Encounter: 05/03/2018  Primary Cardiologist: No primary care provider on file.   Subjective   Patient feeling well this AM. No CP or SHOB. He understands the plan for surgery today. No questions or concerns.   Inpatient Medications    Scheduled Meds: . aspirin  81 mg Oral Daily  . bisacodyl  5 mg Oral Once  . chlorhexidine  60 mL Topical Once  . diazepam  5 mg Oral Once  . insulin   Intravenous To OR  . magnesium sulfate  40 mEq Other To OR  . metoprolol succinate  25 mg Oral Daily  . metoprolol tartrate  12.5 mg Oral Once  . mupirocin ointment  1 application Nasal BID  . papaverine-plasmalyte irrigation   Irrigation To OR  . phenylephrine  30-200 mcg/min Intravenous To OR  . potassium chloride  80 mEq Other To OR  . rosuvastatin  20 mg Oral QHS  . sodium chloride flush  3 mL Intravenous Q12H  . tranexamic acid  15 mg/kg Intravenous To OR  . tranexamic acid  2 mg/kg Intracatheter To OR   Continuous Infusions: . sodium chloride    . cefUROXime (ZINACEF)  IV    . cefUROXime (ZINACEF)  IV    . dexmedetomidine    . DOPamine    . epinephrine    . milrinone    . nitroGLYCERIN    . norepinephrine (LEVOPHED) Adult infusion    . tranexamic acid (CYKLOKAPRON) infusion ([facility])    . vancomycin     PRN Meds: sodium chloride, acetaminophen, ALPRAZolam, morphine injection, ondansetron (ZOFRAN) IV, sodium chloride flush, temazepam   Vital Signs    Vitals:   05/02/18 1755 05/02/18 1815 05/03/18 0322 05/03/18 0621  BP: (!) 151/75 (!) 146/69  135/80  Pulse:  79 82  63  Resp: (!) 24 (!) 24 19 17   Temp:   97.9 F (36.6 C) 98 F (36.7 C)  TempSrc:   Oral Oral  SpO2: 94% 94%  95%  Weight:      Height:        Intake/Output Summary (Last 24 hours) at 05/03/2018 0903 Last data filed at 05/03/2018 0300 Gross per 24 hour  Intake 0 ml  Output -  Net 0 ml   Filed Weights   05/02/18 1231  Weight: 98 kg   Telemetry    NSR - Personally Reviewed  ECG    No new EKG  Physical Exam   Today's Vitals   05/02/18 2110 05/02/18 2210 05/03/18 0322 05/03/18 0621  BP:    135/80  Pulse:    63  Resp:   19 17  Temp:   97.9 F (36.6 C) 98 F (36.7 C)  TempSrc:   Oral Oral  SpO2:    95%  Weight:      Height:      PainSc: 5  0-No pain     Body mass index is 35.94 kg/m.   GEN: No acute distress.   Neck: No JVD Cardiac: RRR, no murmurs, rubs, or gallops.  Respiratory: Clear to auscultation bilaterally. GI:  Soft, nontender, non-distended  MS: No edema; No deformity. Neuro:  Nonfocal  Psych: Normal affect   Labs    Chemistry Recent Labs  Lab 04/30/18 1232 05/02/18 1912 05/03/18 0601  NA 141 136 137  K 4.5 3.5 3.2*  CL 101 107 104  CO2 24 21* 24  GLUCOSE 82 257* 158*  BUN 9 13 14   CREATININE 0.89 1.00 0.89  CALCIUM 9.7 9.2 8.9  PROT  --  6.7  --   ALBUMIN  --  3.5  --   AST  --  22  --   ALT  --  28  --   ALKPHOS  --  50  --   BILITOT  --  0.4  --   GFRNONAA 95 >60 >60  GFRAA 110 >60 >60  ANIONGAP  --  8 9    Hematology Recent Labs  Lab 04/30/18 1232 05/03/18 0601  WBC 11.9* 23.4*  RBC 5.67 5.01  HGB 16.8 14.2  HCT 48.1 43.0  MCV 85 85.8  MCH 29.6 28.3  MCHC 34.9 33.0  RDW 13.6 13.7  PLT 299 275   Cardiac EnzymesNo results for input(s): TROPONINI in the last 168 hours. No results for input(s): TROPIPOC in the last 168 hours.   BNPNo results for input(s): BNP, PROBNP in the last 168 hours.   DDimer No results for input(s): DDIMER in the last 168 hours.   Radiology    Dg Chest Port 1 View  Result Date:  05/02/2018 CLINICAL DATA:  58 y/o  M; shortness of breath and chest pain. EXAM: PORTABLE CHEST 1 VIEW COMPARISON:  09/23/2004 chest radiograph FINDINGS: Stable heart size and mediastinal contours are within normal limits. Both lungs are clear. The visualized skeletal structures are unremarkable. IMPRESSION: No active disease. Electronically Signed   By: Mitzi Hansen M.D.   On: 05/02/2018 19:47   Cardiac Studies   Left Heart Cath 05/02/2018  Ost LM to Mid LM lesion is 95% stenosed.  Prox LAD to Mid LAD lesion is 75% stenosed.  The left ventricular systolic function is normal.  LV end diastolic pressure is normal.  The left ventricular ejection fraction is 55-65% by visual estimate.  TTE 05/03/2018  1. The left ventricle has a 2D calculated ejection fraction of 61% by biplane volume. The cavity size was normal. Left ventricular diastolic parameters were normal.  2. The right ventricle has normal systolic function. The cavity was normal. There is no increase in right ventricular wall thickness. Right ventricular systolic pressure could not be assessed.  3. The mitral valve is normal in structure.  4. The tricuspid valve is normal in structure.  5. The aortic valve is normal in structure.  6. The pulmonic valve was normal in structure.  7. The aortic root and ascending aorta are normal in size and structure.  8. The inferior vena cava was dilated in size with >50% respiratory variability.  Patient Profile     58 y.o. male with tobacco use disorder, obesity, and HTN who presented with unstable angina. He was subsequently taken for heart cath and found to have multivessel disease including left main.   Assessment & Plan    Unstable Angina  CAD  - No CP or SHOB since admission  - Echo with preserved EF - Plan is for CABG today  - Continue rosuvastatin, ASA, and metoprolol  - Check hemoglobin A1c   Will discuss the case further with Dr. Katrinka Blazing.   For questions or updates,  please contact  [facility] Please consult [email or web address] for contact info under Cardiology/STEMI.   Signed, Levora DredgeJustin Helberg, MD  05/03/2018, 9:03 AM

## 2018-05-03 NOTE — OR Nursing (Signed)
Forty-five minute call to [facility] charge nurse at [phone].

## 2018-05-03 NOTE — Anesthesia Procedure Notes (Signed)
Procedure Name: Intubation Date/Time: 05/03/2018 4:17 PM Performed by: Barrington Ellison, CRNA Pre-anesthesia Checklist: Patient identified, Emergency Drugs available, Suction available and Patient being monitored Patient Re-evaluated:Patient Re-evaluated prior to induction Oxygen Delivery Method: Circle System Utilized Preoxygenation: Pre-oxygenation with 100% oxygen Induction Type: IV induction Ventilation: Mask ventilation without difficulty Laryngoscope Size: Mac and 4 Grade View: Grade I Tube type: Oral Tube size: 8.0 mm Number of attempts: 2 Airway Equipment and Method: Stylet and Oral airway Placement Confirmation: ETT inserted through vocal cords under direct vision,  positive ETCO2 and breath sounds checked- equal and bilateral Secured at: 22 (at teeth) cm Tube secured with: Tape Dental Injury: Teeth and Oropharynx as per pre-operative assessment

## 2018-05-03 NOTE — Progress Notes (Addendum)
CARDIAC REHAB PHASE I   Pre op education completed. Pt given pre op cardiac surgery book. Pt educated on sternal healing/"stay in the tube" sheet and given "your hospital stay: cardiac surgery sheet as well. Pt education on IS use and return demonstration completed. Pt able to reach 2500 on IS and completed 2 times during education. Pt stated his wife will be able to stay with him after surgery. Nothing further needed at this time. All questions answered.   [id number]  Lyda Kalata RN, BSN 05/03/2018 10:48 AM

## 2018-05-03 NOTE — Anesthesia Preprocedure Evaluation (Signed)
Anesthesia Evaluation  Patient identified by MRN, date of birth, ID band Patient awake    Reviewed: Allergy & Precautions, NPO status , Patient's Chart, lab work & pertinent test results, reviewed documented beta blocker date and time   Airway Mallampati: II  TM Distance: >3 FB Neck ROM: Full    Dental  (+) Edentulous Upper, Dental Advisory Given   Pulmonary Current Smoker,    Pulmonary exam normal        Cardiovascular hypertension, Pt. on home beta blockers + CAD   Rhythm:Regular Rate:Normal     Neuro/Psych negative neurological ROS  negative psych ROS   GI/Hepatic Neg liver ROS, GERD  Medicated,  Endo/Other  negative endocrine ROS  Renal/GU      Musculoskeletal negative musculoskeletal ROS (+)   Abdominal (+) + obese,   Peds  Hematology negative hematology ROS (+)   Anesthesia Other Findings - HLD  Reproductive/Obstetrics                             Lab Results  Component Value Date   WBC 23.4 (H) 05/03/2018   HGB 14.2 05/03/2018   HCT 43.0 05/03/2018   MCV 85.8 05/03/2018   PLT 275 05/03/2018   Lab Results  Component Value Date   CREATININE 0.89 05/03/2018   BUN 14 05/03/2018   NA 137 05/03/2018   K 3.2 (L) 05/03/2018   CL 104 05/03/2018   CO2 24 05/03/2018   Lab Results  Component Value Date   INR 1.17 05/02/2018   Echo:  1. The left ventricle has a 2D calculated ejection fraction of 61% by biplane volume. The cavity size was normal. Left ventricular diastolic parameters were normal.  2. The right ventricle has normal systolic function. The cavity was normal. There is no increase in right ventricular wall thickness. Right ventricular systolic pressure could not be assessed.  3. The mitral valve is normal in structure.  4. The tricuspid valve is normal in structure.  5. The aortic valve is normal in structure.  6. The pulmonic valve was normal in structure.  7. The  aortic root and ascending aorta are normal in size and structure.  8. The inferior vena cava was dilated in size with >50% respiratory variability.  Cath:  Ost LM to Mid LM lesion is 95% stenosed.  Prox LAD to Mid LAD lesion is 75% stenosed.  The left ventricular systolic function is normal.  LV end diastolic pressure is normal.  The left ventricular ejection fraction is 55-65% by visual estimate.  Anesthesia Physical Anesthesia Plan  ASA: IV  Anesthesia Plan: General   Post-op Pain Management:    Induction: Intravenous  PONV Risk Score and Plan: Treatment may vary due to age or medical condition  Airway Management Planned: Oral ETT  Additional Equipment: Arterial line, CVP, PA Cath, TEE and Ultrasound Guidance Line Placement  Intra-op Plan:   Post-operative Plan: Post-operative intubation/ventilation  Informed Consent: I have reviewed the patients History and Physical, chart, labs and discussed the procedure including the risks, benefits and alternatives for the proposed anesthesia with the patient or authorized representative who has indicated his/her understanding and acceptance.     Dental advisory given  Plan Discussed with: CRNA  Anesthesia Plan Comments:         Anesthesia Quick Evaluation

## 2018-05-03 NOTE — OR Nursing (Signed)
Twenty minute call to [facility] charge nurse at [phone].

## 2018-05-03 NOTE — Anesthesia Procedure Notes (Signed)
Arterial Line Insertion Start/End2/21/2020 3:00 PM, 05/03/2018 3:05 PM Performed by: Waynard Edwards, CRNA, CRNA  Patient location: Pre-op. Preanesthetic checklist: patient identified, IV checked, site marked, risks and benefits discussed, surgical consent, monitors and equipment checked, pre-op evaluation, timeout performed and anesthesia consent Lidocaine 1% used for infiltration Left, radial was placed Catheter size: 20 G Hand hygiene performed , maximum sterile barriers used  and Seldinger technique used Allen's test indicative of satisfactory collateral circulation Attempts: 1 Procedure performed without using ultrasound guided technique. Following insertion, dressing applied and Biopatch. Post procedure assessment: normal and unchanged  Patient tolerated the procedure well with no immediate complications.

## 2018-05-03 NOTE — Progress Notes (Signed)
Pre Procedure note for inpatients:   Jimmy Collins has been scheduled for Procedure(s): CORONARY ARTERY BYPASS GRAFTING (CABG) (N/A) TRANSESOPHAGEAL ECHOCARDIOGRAM (TEE) (N/A) today. The various methods of treatment have been discussed with the patient. After consideration of the risks, benefits and treatment options the patient has consented to the planned procedure.   The patient has been seen and labs reviewed. There are no changes in the patient's condition to prevent proceeding with the planned procedure today.  Recent labs:  Lab Results  Component Value Date   WBC 23.4 (H) 05/03/2018   HGB 14.2 05/03/2018   HCT 43.0 05/03/2018   PLT 275 05/03/2018   GLUCOSE 158 (H) 05/03/2018   CHOL 204 (H) 01/28/2018   TRIG 156 (H) 01/28/2018   HDL 31 (L) 01/28/2018   LDLCALC 144 (H) 01/28/2018   ALT 28 05/02/2018   AST 22 05/02/2018   NA 137 05/03/2018   K 3.2 (L) 05/03/2018   CL 104 05/03/2018   CREATININE 0.89 05/03/2018   BUN 14 05/03/2018   CO2 24 05/03/2018   TSH 0.554 05/03/2018   PSA 0.4 01/28/2018   INR 1.17 05/02/2018   HGBA1C 6.0 (H) 05/03/2018    2D echo shows normal LV fx without sig valve disease Carotid dopplers show no sig disease Patient stable on iv heparin Jimmy Bussing, MD 05/03/2018 1:54 PM

## 2018-05-03 NOTE — Anesthesia Procedure Notes (Signed)
Central Venous Catheter Insertion Performed by: Shelton Silvas, MD, anesthesiologist Start/End2/21/2020 2:55 PM, 05/03/2018 3:05 PM Patient location: Pre-op. Preanesthetic checklist: patient identified, IV checked, site marked, risks and benefits discussed, surgical consent, monitors and equipment checked, pre-op evaluation, timeout performed and anesthesia consent Position: Trendelenburg Lidocaine 1% used for infiltration and patient sedated Hand hygiene performed , maximum sterile barriers used  and Seldinger technique used Catheter size: 9 Fr Total catheter length 10. Central line was placed.Sheath introducer Swan type:thermodilution PA Cath depth:50 Procedure performed using ultrasound guided technique. Ultrasound Notes:anatomy identified, needle tip was noted to be adjacent to the nerve/plexus identified, no ultrasound evidence of intravascular and/or intraneural injection and image(s) printed for medical record Attempts: 1 Following insertion, line sutured and dressing applied. Post procedure assessment: blood return through all ports, free fluid flow and no air  Patient tolerated the procedure well with no immediate complications.

## 2018-05-04 ENCOUNTER — Inpatient Hospital Stay (HOSPITAL_COMMUNITY): Payer: Managed Care, Other (non HMO)

## 2018-05-04 DIAGNOSIS — Z951 Presence of aortocoronary bypass graft: Secondary | ICD-10-CM

## 2018-05-04 LAB — POCT I-STAT 7, (LYTES, BLD GAS, ICA,H+H)
Acid-Base Excess: 5 mmol/L — ABNORMAL HIGH (ref 0.0–2.0)
Acid-Base Excess: 2 mmol/L (ref 0.0–2.0)
Bicarbonate: 25.9 mmol/L (ref 20.0–28.0)
Bicarbonate: 29.4 mmol/L — ABNORMAL HIGH (ref 20.0–28.0)
Bicarbonate: 26.2 mmol/L (ref 20.0–28.0)
Calcium, Ion: 1.1 mmol/L — ABNORMAL LOW (ref 1.15–1.40)
Calcium, Ion: 1.04 mmol/L — ABNORMAL LOW (ref 1.15–1.40)
Calcium, Ion: 1.05 mmol/L — ABNORMAL LOW (ref 1.15–1.40)
HCT: 37 % — ABNORMAL LOW (ref 39.0–52.0)
HCT: 31 % — ABNORMAL LOW (ref 39.0–52.0)
HCT: 33 % — ABNORMAL LOW (ref 39.0–52.0)
Hemoglobin: 12.6 g/dL — ABNORMAL LOW (ref 13.0–17.0)
Hemoglobin: 10.5 g/dL — ABNORMAL LOW (ref 13.0–17.0)
Hemoglobin: 11.2 g/dL — ABNORMAL LOW (ref 13.0–17.0)
O2 Saturation: 99 %
O2 Saturation: 96 %
O2 Saturation: 93 %
Patient temperature: 37.5
Patient temperature: 37.9
Patient temperature: 37.9
Potassium: 4.2 mmol/L (ref 3.5–5.1)
Potassium: 3.6 mmol/L (ref 3.5–5.1)
Potassium: 3.6 mmol/L (ref 3.5–5.1)
Sodium: 142 mmol/L (ref 135–145)
Sodium: 144 mmol/L (ref 135–145)
Sodium: 143 mmol/L (ref 135–145)
TCO2: 27 mmol/L (ref 22–32)
TCO2: 31 mmol/L (ref 22–32)
TCO2: 27 mmol/L (ref 22–32)
pCO2 arterial: 44.8 mmHg (ref 32.0–48.0)
pCO2 arterial: 42.7 mmHg (ref 32.0–48.0)
pCO2 arterial: 41.5 mmHg (ref 32.0–48.0)
pH, Arterial: 7.372 (ref 7.350–7.450)
pH, Arterial: 7.449 (ref 7.350–7.450)
pH, Arterial: 7.412 (ref 7.350–7.450)
pO2, Arterial: 128 mmHg — ABNORMAL HIGH (ref 83.0–108.0)
pO2, Arterial: 79 mmHg — ABNORMAL LOW (ref 83.0–108.0)
pO2, Arterial: 68 mmHg — ABNORMAL LOW (ref 83.0–108.0)

## 2018-05-04 LAB — BASIC METABOLIC PANEL
Anion gap: 9 (ref 5–15)
Anion gap: 7 (ref 5–15)
BUN: 14 mg/dL (ref 6–20)
BUN: 15 mg/dL (ref 6–20)
CO2: 24 mmol/L (ref 22–32)
CO2: 24 mmol/L (ref 22–32)
Calcium: 7.3 mg/dL — ABNORMAL LOW (ref 8.9–10.3)
Calcium: 7.6 mg/dL — ABNORMAL LOW (ref 8.9–10.3)
Chloride: 108 mmol/L (ref 98–111)
Chloride: 109 mmol/L (ref 98–111)
Creatinine, Ser: 0.99 mg/dL (ref 0.61–1.24)
Creatinine, Ser: 0.99 mg/dL (ref 0.61–1.24)
GFR calc Af Amer: >60 mL/min (ref >60)
GFR calc Af Amer: >60 mL/min (ref >60)
GFR calc non Af Amer: >60 mL/min (ref >60)
GFR calc non Af Amer: >60 mL/min (ref >60)
Glucose, Bld: 139 mg/dL — ABNORMAL HIGH (ref 70–99)
Glucose, Bld: 153 mg/dL — ABNORMAL HIGH (ref 70–99)
Potassium: 4.1 mmol/L (ref 3.5–5.1)
Potassium: 3.4 mmol/L — ABNORMAL LOW (ref 3.5–5.1)
Sodium: 141 mmol/L (ref 135–145)
Sodium: 140 mmol/L (ref 135–145)

## 2018-05-04 LAB — PREPARE FRESH FROZEN PLASMA: Unit division: 0

## 2018-05-04 LAB — GLUCOSE, CAPILLARY
Glucose-Capillary: 108 mg/dL — ABNORMAL HIGH (ref 70–99)
Glucose-Capillary: 140 mg/dL — ABNORMAL HIGH (ref 70–99)
Glucose-Capillary: 137 mg/dL — ABNORMAL HIGH (ref 70–99)
Glucose-Capillary: 135 mg/dL — ABNORMAL HIGH (ref 70–99)
Glucose-Capillary: 132 mg/dL — ABNORMAL HIGH (ref 70–99)
Glucose-Capillary: 159 mg/dL — ABNORMAL HIGH (ref 70–99)
Glucose-Capillary: 145 mg/dL — ABNORMAL HIGH (ref 70–99)
Glucose-Capillary: 115 mg/dL — ABNORMAL HIGH (ref 70–99)
Glucose-Capillary: 115 mg/dL — ABNORMAL HIGH (ref 70–99)
Glucose-Capillary: 131 mg/dL — ABNORMAL HIGH (ref 70–99)
Glucose-Capillary: 133 mg/dL — ABNORMAL HIGH (ref 70–99)
Glucose-Capillary: 156 mg/dL — ABNORMAL HIGH (ref 70–99)
Glucose-Capillary: 146 mg/dL — ABNORMAL HIGH (ref 70–99)
Glucose-Capillary: 118 mg/dL — ABNORMAL HIGH (ref 70–99)
Glucose-Capillary: 135 mg/dL — ABNORMAL HIGH (ref 70–99)
Glucose-Capillary: 125 mg/dL — ABNORMAL HIGH (ref 70–99)

## 2018-05-04 LAB — CBC
HCT: 36.5 % — ABNORMAL LOW (ref 39.0–52.0)
HCT: 39.1 % (ref 39.0–52.0)
HCT: 39.2 % (ref 39.0–52.0)
Hemoglobin: 12.3 g/dL — ABNORMAL LOW (ref 13.0–17.0)
Hemoglobin: 12.7 g/dL — ABNORMAL LOW (ref 13.0–17.0)
Hemoglobin: 12.9 g/dL — ABNORMAL LOW (ref 13.0–17.0)
MCH: 28.2 pg (ref 26.0–34.0)
MCH: 28.8 pg (ref 26.0–34.0)
MCH: 28.9 pg (ref 26.0–34.0)
MCHC: 32.5 g/dL (ref 30.0–36.0)
MCHC: 32.9 g/dL (ref 30.0–36.0)
MCHC: 33.7 g/dL (ref 30.0–36.0)
MCV: 85.7 fL (ref 80.0–100.0)
MCV: 86.9 fL (ref 80.0–100.0)
MCV: 87.5 fL (ref 80.0–100.0)
Platelets: 164 10*3/uL (ref 150–400)
Platelets: 166 10*3/uL (ref 150–400)
Platelets: 169 10*3/uL (ref 150–400)
RBC: 4.26 MIL/uL (ref 4.22–5.81)
RBC: 4.48 MIL/uL (ref 4.22–5.81)
RBC: 4.5 MIL/uL (ref 4.22–5.81)
RDW: 13.7 % (ref 11.5–15.5)
RDW: 13.8 % (ref 11.5–15.5)
RDW: 14 % (ref 11.5–15.5)
WBC: 16.3 10*3/uL — ABNORMAL HIGH (ref 4.0–10.5)
WBC: 16.7 10*3/uL — ABNORMAL HIGH (ref 4.0–10.5)
WBC: 17.8 10*3/uL — ABNORMAL HIGH (ref 4.0–10.5)
nRBC: 0 % (ref 0.0–0.2)
nRBC: 0 % (ref 0.0–0.2)
nRBC: 0 % (ref 0.0–0.2)

## 2018-05-04 LAB — BPAM FFP
Blood Product Expiration Date: 202002262359
Blood Product Expiration Date: 202002262359
ISSUE DATE / TIME: 202002212133
ISSUE DATE / TIME: 202002212133
Unit Type and Rh: 6200
Unit Type and Rh: 6200

## 2018-05-04 LAB — APTT: aPTT: 28 seconds (ref 24–36)

## 2018-05-04 LAB — PROTIME-INR
INR: 1.4
Prothrombin Time: 17 seconds — ABNORMAL HIGH (ref 11.4–15.2)

## 2018-05-04 LAB — MAGNESIUM
Magnesium: 2.8 mg/dL — ABNORMAL HIGH (ref 1.7–2.4)
Magnesium: 2.2 mg/dL (ref 1.7–2.4)

## 2018-05-04 MED ORDER — MAGNESIUM SULFATE 4 GM/100ML IV SOLN
4.0000 g | Freq: Once | INTRAVENOUS | Status: AC
  Administered 2018-05-04: 4 g via INTRAVENOUS
  Filled 2018-05-04: qty 100

## 2018-05-04 MED ORDER — ACETAMINOPHEN 160 MG/5ML PO SOLN: 650.0000 mg | Freq: Once | ORAL | Status: AC

## 2018-05-04 MED ORDER — ROSUVASTATIN CALCIUM 20 MG PO TABS
20.0000 mg | ORAL_TABLET | Freq: Every day | ORAL | Status: DC
  Administered 2018-05-04 – 2018-05-09 (×6): 20 mg via ORAL
  Filled 2018-05-04 (×6): qty 1

## 2018-05-04 MED ORDER — CHLORHEXIDINE GLUCONATE 0.12 % MT SOLN
15.0000 mL | OROMUCOSAL | Status: AC
  Administered 2018-05-04: 15 mL via OROMUCOSAL

## 2018-05-04 MED ORDER — CHLORHEXIDINE GLUCONATE 0.12% ORAL RINSE (MEDLINE KIT)
15.0000 mL | Freq: Two times a day (BID) | OROMUCOSAL | Status: DC
  Administered 2018-05-04 (×2): 15 mL via OROMUCOSAL

## 2018-05-04 MED ORDER — BISACODYL 5 MG PO TBEC
10.0000 mg | DELAYED_RELEASE_TABLET | Freq: Every day | ORAL | Status: DC
  Administered 2018-05-07: 10 mg via ORAL
  Filled 2018-05-04 (×3): qty 2

## 2018-05-04 MED ORDER — ALBUMIN HUMAN 5 % IV SOLN
250.0000 mL | INTRAVENOUS | Status: AC | PRN
  Administered 2018-05-04 (×2): 12.5 g via INTRAVENOUS

## 2018-05-04 MED ORDER — NOREPINEPHRINE 4 MG/250ML-% IV SOLN: 0.0000 ug/min | INTRAVENOUS | Status: DC

## 2018-05-04 MED ORDER — NOREPINEPHRINE 4 MG/250ML-% IV SOLN
2.0000 ug/min | INTRAVENOUS | Status: DC
  Administered 2018-05-04: 3 ug/min via INTRAVENOUS

## 2018-05-04 MED ORDER — ACETAMINOPHEN 500 MG PO TABS
1000.0000 mg | ORAL_TABLET | Freq: Four times a day (QID) | ORAL | Status: AC
  Administered 2018-05-04 – 2018-05-08 (×14): 1000 mg via ORAL
  Filled 2018-05-04 (×15): qty 2

## 2018-05-04 MED ORDER — DOCUSATE SODIUM 100 MG PO CAPS
200.0000 mg | ORAL_CAPSULE | Freq: Every day | ORAL | Status: DC
  Administered 2018-05-06 – 2018-05-09 (×4): 200 mg via ORAL
  Filled 2018-05-04 (×5): qty 2

## 2018-05-04 MED ORDER — PANTOPRAZOLE SODIUM 40 MG PO TBEC
40.0000 mg | DELAYED_RELEASE_TABLET | Freq: Every day | ORAL | Status: DC
  Administered 2018-05-05 – 2018-05-10 (×6): 40 mg via ORAL
  Filled 2018-05-04 (×6): qty 1

## 2018-05-04 MED ORDER — DOPAMINE-DEXTROSE 3.2-5 MG/ML-% IV SOLN: 0.0000 ug/kg/min | INTRAVENOUS | Status: DC

## 2018-05-04 MED ORDER — INSULIN REGULAR BOLUS VIA INFUSION
0.0000 [IU] | Freq: Three times a day (TID) | INTRAVENOUS | Status: DC
  Filled 2018-05-04: qty 10

## 2018-05-04 MED ORDER — FAMOTIDINE IN NACL 20-0.9 MG/50ML-% IV SOLN
20.0000 mg | Freq: Two times a day (BID) | INTRAVENOUS | Status: AC
  Administered 2018-05-04 (×2): 20 mg via INTRAVENOUS
  Filled 2018-05-04: qty 50

## 2018-05-04 MED ORDER — BISACODYL 10 MG RE SUPP
10.0000 mg | Freq: Every day | RECTAL | Status: DC
  Administered 2018-05-04: 10 mg via RECTAL
  Filled 2018-05-04: qty 1

## 2018-05-04 MED ORDER — MOMETASONE FURO-FORMOTEROL FUM 200-5 MCG/ACT IN AERO
2.0000 | INHALATION_SPRAY | Freq: Two times a day (BID) | RESPIRATORY_TRACT | Status: DC
  Administered 2018-05-04 – 2018-05-10 (×12): 2 via RESPIRATORY_TRACT
  Filled 2018-05-04: qty 8.8

## 2018-05-04 MED ORDER — SODIUM CHLORIDE 0.9% FLUSH: 3.0000 mL | INTRAVENOUS | Status: DC | PRN

## 2018-05-04 MED ORDER — ACETAMINOPHEN 160 MG/5ML PO SOLN
1000.0000 mg | Freq: Four times a day (QID) | ORAL | Status: AC
  Administered 2018-05-04 (×2): 1000 mg
  Filled 2018-05-04 (×2): qty 40.6

## 2018-05-04 MED ORDER — LEVALBUTEROL HCL 1.25 MG/0.5ML IN NEBU
1.2500 mg | INHALATION_SOLUTION | Freq: Three times a day (TID) | RESPIRATORY_TRACT | Status: DC
  Administered 2018-05-05 – 2018-05-06 (×5): 1.25 mg via RESPIRATORY_TRACT
  Filled 2018-05-04 (×5): qty 0.5

## 2018-05-04 MED ORDER — MORPHINE SULFATE (PF) 2 MG/ML IV SOLN
1.0000 mg | INTRAVENOUS | Status: DC | PRN
  Administered 2018-05-04 (×2): 1 mg via INTRAVENOUS
  Administered 2018-05-05 (×4): 2 mg via INTRAVENOUS
  Administered 2018-05-05 (×2): 1 mg via INTRAVENOUS
  Administered 2018-05-06: 2 mg via INTRAVENOUS
  Filled 2018-05-04 (×9): qty 1

## 2018-05-04 MED ORDER — ORAL CARE MOUTH RINSE
15.0000 mL | OROMUCOSAL | Status: DC
  Administered 2018-05-04 – 2018-05-05 (×6): 15 mL via OROMUCOSAL

## 2018-05-04 MED ORDER — LEVALBUTEROL HCL 1.25 MG/0.5ML IN NEBU: 1.2500 mg | INHALATION_SOLUTION | Freq: Four times a day (QID) | RESPIRATORY_TRACT | Status: DC | PRN

## 2018-05-04 MED ORDER — SODIUM CHLORIDE 0.45 % IV SOLN
INTRAVENOUS | Status: DC | PRN
  Administered 2018-05-04: 01:00:00 via INTRAVENOUS

## 2018-05-04 MED ORDER — ACETAMINOPHEN 650 MG RE SUPP
650.0000 mg | Freq: Once | RECTAL | Status: AC
  Administered 2018-05-04: 650 mg via RECTAL

## 2018-05-04 MED ORDER — LACTATED RINGERS IV SOLN: INTRAVENOUS | Status: DC

## 2018-05-04 MED ORDER — LACTATED RINGERS IV SOLN: 500.0000 mL | Freq: Once | INTRAVENOUS | Status: DC | PRN

## 2018-05-04 MED ORDER — TRAMADOL HCL 50 MG PO TABS
50.0000 mg | ORAL_TABLET | ORAL | Status: DC | PRN
  Administered 2018-05-04: 100 mg via ORAL
  Filled 2018-05-04: qty 2

## 2018-05-04 MED ORDER — FUROSEMIDE 10 MG/ML IJ SOLN
20.0000 mg | Freq: Two times a day (BID) | INTRAMUSCULAR | Status: DC
  Administered 2018-05-04 – 2018-05-07 (×7): 20 mg via INTRAVENOUS
  Filled 2018-05-04 (×7): qty 2

## 2018-05-04 MED ORDER — MIDAZOLAM HCL 2 MG/2ML IJ SOLN
INTRAMUSCULAR | Status: AC
  Filled 2018-05-04: qty 4

## 2018-05-04 MED ORDER — NOREPINEPHRINE 4 MG/250ML-% IV SOLN
INTRAVENOUS | Status: AC
  Administered 2018-05-04: 3 ug/min via INTRAVENOUS
  Filled 2018-05-04: qty 250

## 2018-05-04 MED ORDER — SODIUM CHLORIDE 0.9 % IV SOLN: INTRAVENOUS | Status: DC

## 2018-05-04 MED ORDER — POTASSIUM CHLORIDE 10 MEQ/50ML IV SOLN
10.0000 meq | INTRAVENOUS | Status: AC
  Administered 2018-05-04 (×3): 10 meq via INTRAVENOUS

## 2018-05-04 MED ORDER — NITROGLYCERIN IN D5W 200-5 MCG/ML-% IV SOLN: 0.0000 ug/min | INTRAVENOUS | Status: DC

## 2018-05-04 MED ORDER — ONDANSETRON HCL 4 MG/2ML IJ SOLN: 4.0000 mg | Freq: Four times a day (QID) | INTRAMUSCULAR | Status: DC | PRN

## 2018-05-04 MED ORDER — POTASSIUM CHLORIDE 10 MEQ/50ML IV SOLN
10.0000 meq | INTRAVENOUS | Status: AC
  Administered 2018-05-04 – 2018-05-05 (×3): 10 meq via INTRAVENOUS
  Filled 2018-05-04 (×3): qty 50

## 2018-05-04 MED ORDER — LEVALBUTEROL HCL 1.25 MG/0.5ML IN NEBU
1.2500 mg | INHALATION_SOLUTION | Freq: Four times a day (QID) | RESPIRATORY_TRACT | Status: DC
  Administered 2018-05-04 (×4): 1.25 mg via RESPIRATORY_TRACT
  Filled 2018-05-04 (×4): qty 0.5

## 2018-05-04 MED ORDER — PHENYLEPHRINE HCL-NACL 20-0.9 MG/250ML-% IV SOLN
0.0000 ug/min | INTRAVENOUS | Status: DC
  Administered 2018-05-04: 30 ug/min via INTRAVENOUS
  Administered 2018-05-05: 15 ug/min via INTRAVENOUS
  Filled 2018-05-04 (×2): qty 250

## 2018-05-04 MED ORDER — METOPROLOL TARTRATE 12.5 MG HALF TABLET
12.5000 mg | ORAL_TABLET | Freq: Two times a day (BID) | ORAL | Status: DC
  Administered 2018-05-04 – 2018-05-05 (×3): 12.5 mg via ORAL
  Filled 2018-05-04 (×3): qty 1

## 2018-05-04 MED ORDER — VANCOMYCIN HCL IN DEXTROSE 1-5 GM/200ML-% IV SOLN
1000.0000 mg | Freq: Once | INTRAVENOUS | Status: AC
  Administered 2018-05-04: 1000 mg via INTRAVENOUS
  Filled 2018-05-04: qty 200

## 2018-05-04 MED ORDER — SODIUM CHLORIDE 0.9% FLUSH
3.0000 mL | Freq: Two times a day (BID) | INTRAVENOUS | Status: DC
  Administered 2018-05-04 – 2018-05-10 (×13): 3 mL via INTRAVENOUS

## 2018-05-04 MED ORDER — METOPROLOL TARTRATE 25 MG/10 ML ORAL SUSPENSION: 12.5000 mg | Freq: Two times a day (BID) | ORAL | Status: DC

## 2018-05-04 MED ORDER — METOCLOPRAMIDE HCL 5 MG/ML IJ SOLN
10.0000 mg | Freq: Four times a day (QID) | INTRAMUSCULAR | Status: DC
  Administered 2018-05-04 – 2018-05-05 (×7): 10 mg via INTRAVENOUS
  Filled 2018-05-04 (×6): qty 2

## 2018-05-04 MED ORDER — ASPIRIN EC 325 MG PO TBEC
325.0000 mg | DELAYED_RELEASE_TABLET | Freq: Every day | ORAL | Status: DC
  Administered 2018-05-05 – 2018-05-10 (×6): 325 mg via ORAL
  Filled 2018-05-04 (×6): qty 1

## 2018-05-04 MED ORDER — SODIUM CHLORIDE 0.9 % IV SOLN: 250.0000 mL | INTRAVENOUS | Status: DC

## 2018-05-04 MED ORDER — METOPROLOL TARTRATE 5 MG/5ML IV SOLN: 2.5000 mg | INTRAVENOUS | Status: DC | PRN

## 2018-05-04 MED ORDER — ORAL CARE MOUTH RINSE
15.0000 mL | Freq: Two times a day (BID) | OROMUCOSAL | Status: DC
  Administered 2018-05-04 – 2018-05-09 (×7): 15 mL via OROMUCOSAL

## 2018-05-04 MED ORDER — LACTATED RINGERS IV SOLN
INTRAVENOUS | Status: DC
  Administered 2018-05-04: 20 mL/h via INTRAVENOUS

## 2018-05-04 MED ORDER — SODIUM CHLORIDE 0.9 % IV SOLN
1.5000 g | Freq: Two times a day (BID) | INTRAVENOUS | Status: AC
  Administered 2018-05-04 – 2018-05-05 (×4): 1.5 g via INTRAVENOUS
  Filled 2018-05-04 (×4): qty 1.5

## 2018-05-04 MED ORDER — OXYCODONE HCL 5 MG PO TABS
5.0000 mg | ORAL_TABLET | ORAL | Status: DC | PRN
  Administered 2018-05-04: 5 mg via ORAL
  Administered 2018-05-05 – 2018-05-09 (×10): 10 mg via ORAL
  Filled 2018-05-04 (×11): qty 2

## 2018-05-04 MED ORDER — INSULIN REGULAR(HUMAN) IN NACL 100-0.9 UT/100ML-% IV SOLN
INTRAVENOUS | Status: DC
  Administered 2018-05-04: 0.6 [IU]/h via INTRAVENOUS
  Filled 2018-05-04: qty 100

## 2018-05-04 MED ORDER — POTASSIUM CHLORIDE CRYS ER 20 MEQ PO TBCR: 20.0000 meq | EXTENDED_RELEASE_TABLET | ORAL | Status: DC

## 2018-05-04 MED ORDER — MIDAZOLAM HCL 2 MG/2ML IJ SOLN
2.0000 mg | INTRAMUSCULAR | Status: DC | PRN
  Administered 2018-05-04: 2 mg via INTRAVENOUS
  Filled 2018-05-04: qty 2

## 2018-05-04 MED ORDER — ASPIRIN 81 MG PO CHEW
324.0000 mg | CHEWABLE_TABLET | Freq: Every day | ORAL | Status: DC
  Administered 2018-05-04: 324 mg
  Filled 2018-05-04 (×2): qty 4

## 2018-05-04 MED ORDER — DEXMEDETOMIDINE HCL IN NACL 200 MCG/50ML IV SOLN
0.0000 ug/kg/h | INTRAVENOUS | Status: DC
  Administered 2018-05-04 (×4): 0.7 ug/kg/h via INTRAVENOUS
  Filled 2018-05-04 (×4): qty 50

## 2018-05-04 NOTE — Transfer of Care (Signed)
Immediate Anesthesia Transfer of Care Note  Patient: Becky Augusta  Procedure(s) Performed: CORONARY ARTERY BYPASS GRAFTING (CABG), ON PUMP, TIMES TWO, USING LEFT INTERNAL MAMMARY ARTERY AND ENDOSCOPICALLY HARVESTED LEFT GREATER SAPHENOUS VEIN (N/A Chest) TRANSESOPHAGEAL ECHOCARDIOGRAM (TEE) (N/A )  Patient Location: SICU  Anesthesia Type:General  Level of Consciousness: Patient remains intubated per anesthesia plan  Airway & Oxygen Therapy: Patient placed on Ventilator (see vital sign flow sheet for setting)  Post-op Assessment: Report given to RN and Post -op Vital signs reviewed and stable  Post vital signs: Reviewed and stable  Last Vitals:  Vitals Value Taken Time  BP    Temp    Pulse    Resp    SpO2      Last Pain:  Vitals:   05/03/18 1100  TempSrc: Oral  PainSc: 0-No pain      Patients Stated Pain Goal: 3 (05/02/18 1252)  Complications: No apparent anesthesia complications

## 2018-05-04 NOTE — Progress Notes (Signed)
IV Fentanyl 200 ml wasted in steri cycle.  Witnessed by Josefa Half RN

## 2018-05-04 NOTE — Procedures (Signed)
Extubation Procedure Note  Patient Details:   Name: Jimmy Collins DOB: [birth year] MRN: [id number]   Airway Documentation:    Vent end date: 05/04/18 Vent end time: 1548   Evaluation  O2 sats: stable throughout Complications: No apparent complications Patient did tolerate procedure well. Bilateral Breath Sounds: Clear, Diminished   Yes  PT was extubated to a 4L [place]  PT has strong cough and is able to clear secretions  PT is able to talk  RT to monitor   NIF (-40) VC (8L)  Shannia Jacuinde, Duane Lope 05/04/2018, 3:49 PM

## 2018-05-04 NOTE — Plan of Care (Signed)
  Problem: Education: Goal: Knowledge of General Education information will improve Description Including pain rating scale, medication(s)/side effects and non-pharmacologic comfort measures Outcome: Progressing   Problem: Health Behavior/Discharge Planning: Goal: Ability to manage health-related needs will improve Outcome: Progressing   Problem: Clinical Measurements: Goal: Ability to maintain clinical measurements within normal limits will improve Outcome: Progressing Goal: Will remain free from infection Outcome: Progressing Goal: Diagnostic test results will improve Outcome: Progressing Goal: Respiratory complications will improve Outcome: Progressing Goal: Cardiovascular complication will be avoided Outcome: Progressing   Problem: Activity: Goal: Risk for activity intolerance will decrease Outcome: Progressing   Problem: Nutrition: Goal: Adequate nutrition will be maintained Outcome: Progressing   Problem: Coping: Goal: Level of anxiety will decrease Outcome: Progressing   Problem: Elimination: Goal: Will not experience complications related to bowel motility Outcome: Progressing Goal: Will not experience complications related to urinary retention Outcome: Progressing   Problem: Pain Managment: Goal: General experience of comfort will improve Outcome: Progressing   Problem: Safety: Goal: Ability to remain free from injury will improve Outcome: Progressing   Problem: Skin Integrity: Goal: Risk for impaired skin integrity will decrease Outcome: Progressing   Problem: Cardiac: Goal: Will achieve and/or maintain hemodynamic stability Outcome: Progressing   Problem: Clinical Measurements: Goal: Postoperative complications will be avoided or minimized Outcome: Progressing   Problem: Respiratory: Goal: Respiratory status will improve Outcome: Progressing   Problem: Urinary Elimination: Goal: Ability to achieve and maintain adequate renal perfusion and  functioning will improve Outcome: Progressing

## 2018-05-04 NOTE — Progress Notes (Signed)
Patient ID: Jimmy Collins, male   DOB: 06-13-1960, 57 y.o.   MRN: [id number] TCTS Evening Rounds:  Hemodynamically stable on dop 2, NE 3, neo 20  Last CI 2.1  Awake and alert, no complaints.  UO good.  CT output low.  BMET    Component Value Date/Time   NA 140 05/04/2018 1902   NA 141 04/30/2018 1232   K 3.4 (L) 05/04/2018 1902   CL 109 05/04/2018 1902   CO2 24 05/04/2018 1902   GLUCOSE 153 (H) 05/04/2018 1902   BUN 15 05/04/2018 1902   BUN 9 04/30/2018 1232   CREATININE 0.99 05/04/2018 1902   CREATININE 0.83 01/28/2018 0853   CALCIUM 7.6 (L) 05/04/2018 1902   GFRNONAA >60 05/04/2018 1902   GFRNONAA 98 01/28/2018 0853   GFRAA >60 05/04/2018 1902   GFRAA 113 01/28/2018 0853     CBC    Component Value Date/Time   WBC 16.7 (H) 05/04/2018 1902   RBC 4.26 05/04/2018 1902   HGB 12.3 (L) 05/04/2018 1902   HGB 16.8 04/30/2018 1232   HCT 36.5 (L) 05/04/2018 1902   HCT 48.1 04/30/2018 1232   PLT 164 05/04/2018 1902   PLT 299 04/30/2018 1232   MCV 85.7 05/04/2018 1902   MCV 85 04/30/2018 1232   MCH 28.9 05/04/2018 1902   MCHC 33.7 05/04/2018 1902   RDW 13.7 05/04/2018 1902   RDW 13.6 04/30/2018 1232   LYMPHSABS 3,913 (H) 01/28/2018 0853   EOSABS 545 (H) 01/28/2018 0853   BASOSABS 164 01/28/2018 0853    Replace K+

## 2018-05-04 NOTE — Anesthesia Postprocedure Evaluation (Signed)
Anesthesia Post Note  Patient: Jimmy Collins  Procedure(s) Performed: CORONARY ARTERY BYPASS GRAFTING (CABG), ON PUMP, TIMES TWO, USING LEFT INTERNAL MAMMARY ARTERY AND ENDOSCOPICALLY HARVESTED LEFT GREATER SAPHENOUS VEIN (N/A Chest) TRANSESOPHAGEAL ECHOCARDIOGRAM (TEE) (N/A )     Patient location during evaluation: SICU Anesthesia Type: General Level of consciousness: sedated Pain management: pain level controlled Vital Signs Assessment: post-procedure vital signs reviewed and stable Respiratory status: patient remains intubated per anesthesia plan Cardiovascular status: stable Postop Assessment: no apparent nausea or vomiting Anesthetic complications: no    Last Vitals:  Vitals:   05/04/18 0031 05/04/18 0108  BP:    Pulse:    Resp:    Temp:    SpO2: 97% 99%    Last Pain:  Vitals:   05/03/18 1100  TempSrc: Oral  PainSc: 0-No pain                 Cassaundra Rasch S

## 2018-05-04 NOTE — Progress Notes (Signed)
1 Day Post-Op Procedure(s) (LRB): CORONARY ARTERY BYPASS GRAFTING (CABG), ON PUMP, TIMES TWO, USING LEFT INTERNAL MAMMARY ARTERY AND ENDOSCOPICALLY HARVESTED LEFT GREATER SAPHENOUS VEIN (N/A) TRANSESOPHAGEAL ECHOCARDIOGRAM (TEE) (N/A) Subjective: Responsive on ventilator FiO2 weaned to 50%, chest x-ray this a.m. fairly clear Stable hemodynamics on low-dose norepinephrine and dopamine Good renal function Minimal chest tube output  Patient with significant COPD, 80-pack-year history of smoking Will try to carefully wean ventilator, continue with diuresis, and airway bronchodilators and sedation  Objective: Vital signs in last 24 hours: Temp:  [98.8 F (37.1 C)-100.8 F (38.2 C)] 100.8 F (38.2 C) (02/22 1100) Pulse Rate:  [75-90] 90 (02/22 1139) Cardiac Rhythm: Normal sinus rhythm (02/22 0730) Resp:  [11-24] 22 (02/22 1139) BP: (82-114)/(50-87) 95/50 (02/22 1139) SpO2:  [95 %-100 %] 97 % (02/22 1139) Arterial Line BP: (88-126)/(49-73) 117/54 (02/22 1100) FiO2 (%):  [40 %-100 %] 40 % (02/22 1139) Weight:  [98 kg] 98 kg (02/21 1430)  Hemodynamic parameters for last 24 hours: PAP: (19-34)/(11-21) 19/11 CO:  [3.8 L/min-6.3 L/min] 6.3 L/min CI:  [1.9 L/min/m2-3.1 L/min/m2] 3.1 L/min/m2  Intake/Output from previous day: 02/21 0701 - 02/22 0700 In: 4962 [I.V.:3597.1; Blood:1002; IV Piggyback:362.9] Out: 4812 [Urine:3455; Blood:1125; Chest Tube:232] Intake/Output this shift: Total I/O In: 1237.8 [I.V.:447.5; IV Piggyback:790.3] Out: 655 [Urine:565; Chest Tube:90]       Exam    General- alert and comfortable    Neck- no JVD, no cervical adenopathy palpable, no carotid bruit   Lungs- clear without rales, wheezes   Cor- regular rate and rhythm, no murmur , gallop   Abdomen- soft, non-tender   Extremities - warm, non-tender, minimal edema   Neuro- oriented, appropriate, no focal weakness   Lab Results: Recent Labs    05/04/18 0030 05/04/18 0318 05/04/18 0616  WBC 17.8*   --  16.3*  HGB 12.7* 12.6* 12.9*  HCT 39.1 37.0* 39.2  PLT 169  --  166   BMET:  Recent Labs    05/03/18 0601  05/03/18 2241  05/04/18 0318 05/04/18 0616  NA 137   < > 142   < > 142 141  K 3.2*   < > 3.6   < > 4.2 4.1  CL 104  --   --   --   --  108  CO2 24  --   --   --   --  24  GLUCOSE 158*   < > 122*  --   --  139*  BUN 14  --   --   --   --  14  CREATININE 0.89  --   --   --   --  0.99  CALCIUM 8.9  --   --   --   --  7.3*   < > = values in this interval not displayed.    PT/INR:  Recent Labs    05/04/18 0030  LABPROT 17.0*  INR 1.40   ABG    Component Value Date/Time   PHART 7.372 05/04/2018 0318   HCO3 25.9 05/04/2018 0318   TCO2 27 05/04/2018 0318   ACIDBASEDEF 1.0 05/03/2018 2245   O2SAT 99.0 05/04/2018 0318   CBG (last 3)  Recent Labs    05/04/18 0857 05/04/18 1009 05/04/18 1056  GLUCAP 115* 115* 131*    Assessment/Plan: S/P Procedure(s) (LRB): CORONARY ARTERY BYPASS GRAFTING (CABG), ON PUMP, TIMES TWO, USING LEFT INTERNAL MAMMARY ARTERY AND ENDOSCOPICALLY HARVESTED LEFT GREATER SAPHENOUS VEIN (N/A) TRANSESOPHAGEAL ECHOCARDIOGRAM (TEE) (N/A) Diuresis Vent wean per  protocol   LOS: 2 days    Kathlee Nations Trigt III 05/04/2018

## 2018-05-05 ENCOUNTER — Inpatient Hospital Stay (HOSPITAL_COMMUNITY): Payer: Managed Care, Other (non HMO)

## 2018-05-05 LAB — TYPE AND SCREEN
ABO/RH(D): O POS
Antibody Screen: NEGATIVE
Unit division: 0
Unit division: 0

## 2018-05-05 LAB — POCT I-STAT 7, (LYTES, BLD GAS, ICA,H+H)
Acid-Base Excess: 1 mmol/L (ref 0.0–2.0)
Bicarbonate: 26.1 mmol/L (ref 20.0–28.0)
Bicarbonate: 27.2 mmol/L (ref 20.0–28.0)
Calcium, Ion: 1.12 mmol/L — ABNORMAL LOW (ref 1.15–1.40)
Calcium, Ion: 1.08 mmol/L — ABNORMAL LOW (ref 1.15–1.40)
HCT: 46 % (ref 39.0–52.0)
HCT: 35 % — ABNORMAL LOW (ref 39.0–52.0)
Hemoglobin: 15.6 g/dL (ref 13.0–17.0)
Hemoglobin: 11.9 g/dL — ABNORMAL LOW (ref 13.0–17.0)
O2 Saturation: 94 %
O2 Saturation: 99 %
Patient temperature: 98.4
Patient temperature: 37.2
Potassium: 4 mmol/L (ref 3.5–5.1)
Potassium: 3.8 mmol/L (ref 3.5–5.1)
Sodium: 141 mmol/L (ref 135–145)
Sodium: 142 mmol/L (ref 135–145)
TCO2: 28 mmol/L (ref 22–32)
TCO2: 29 mmol/L (ref 22–32)
pCO2 arterial: 47.1 mmHg (ref 32.0–48.0)
pCO2 arterial: 48.5 mmHg — ABNORMAL HIGH (ref 32.0–48.0)
pH, Arterial: 7.35 (ref 7.350–7.450)
pH, Arterial: 7.358 (ref 7.350–7.450)
pO2, Arterial: 74 mmHg — ABNORMAL LOW (ref 83.0–108.0)
pO2, Arterial: 170 mmHg — ABNORMAL HIGH (ref 83.0–108.0)

## 2018-05-05 LAB — GLUCOSE, CAPILLARY
Glucose-Capillary: 106 mg/dL — ABNORMAL HIGH (ref 70–99)
Glucose-Capillary: 108 mg/dL — ABNORMAL HIGH (ref 70–99)
Glucose-Capillary: 109 mg/dL — ABNORMAL HIGH (ref 70–99)
Glucose-Capillary: 109 mg/dL — ABNORMAL HIGH (ref 70–99)
Glucose-Capillary: 110 mg/dL — ABNORMAL HIGH (ref 70–99)
Glucose-Capillary: 112 mg/dL — ABNORMAL HIGH (ref 70–99)
Glucose-Capillary: 114 mg/dL — ABNORMAL HIGH (ref 70–99)
Glucose-Capillary: 117 mg/dL — ABNORMAL HIGH (ref 70–99)
Glucose-Capillary: 119 mg/dL — ABNORMAL HIGH (ref 70–99)
Glucose-Capillary: 122 mg/dL — ABNORMAL HIGH (ref 70–99)
Glucose-Capillary: 125 mg/dL — ABNORMAL HIGH (ref 70–99)
Glucose-Capillary: 125 mg/dL — ABNORMAL HIGH (ref 70–99)
Glucose-Capillary: 132 mg/dL — ABNORMAL HIGH (ref 70–99)
Glucose-Capillary: 134 mg/dL — ABNORMAL HIGH (ref 70–99)
Glucose-Capillary: 159 mg/dL — ABNORMAL HIGH (ref 70–99)
Glucose-Capillary: 99 mg/dL (ref 70–99)
Glucose-Capillary: 99 mg/dL (ref 70–99)

## 2018-05-05 LAB — CBC
HCT: 36.1 % — ABNORMAL LOW (ref 39.0–52.0)
Hemoglobin: 12.1 g/dL — ABNORMAL LOW (ref 13.0–17.0)
MCH: 29.6 pg (ref 26.0–34.0)
MCHC: 33.5 g/dL (ref 30.0–36.0)
MCV: 88.3 fL (ref 80.0–100.0)
Platelets: 145 10*3/uL — ABNORMAL LOW (ref 150–400)
RBC: 4.09 MIL/uL — ABNORMAL LOW (ref 4.22–5.81)
RDW: 14 % (ref 11.5–15.5)
WBC: 17.4 10*3/uL — ABNORMAL HIGH (ref 4.0–10.5)
nRBC: 0 % (ref 0.0–0.2)

## 2018-05-05 LAB — BPAM RBC
Blood Product Expiration Date: 202003222359
Blood Product Expiration Date: 202003222359
Unit Type and Rh: 5100
Unit Type and Rh: 5100

## 2018-05-05 LAB — COMPREHENSIVE METABOLIC PANEL
ALT: 28 U/L (ref 0–44)
AST: 50 U/L — ABNORMAL HIGH (ref 15–41)
Albumin: 3.3 g/dL — ABNORMAL LOW (ref 3.5–5.0)
Alkaline Phosphatase: 37 U/L — ABNORMAL LOW (ref 38–126)
Anion gap: 11 (ref 5–15)
BUN: 19 mg/dL (ref 6–20)
CO2: 22 mmol/L (ref 22–32)
Calcium: 7.7 mg/dL — ABNORMAL LOW (ref 8.9–10.3)
Chloride: 106 mmol/L (ref 98–111)
Creatinine, Ser: 1.17 mg/dL (ref 0.61–1.24)
GFR calc Af Amer: >60 mL/min (ref >60)
GFR calc non Af Amer: >60 mL/min (ref >60)
Glucose, Bld: 119 mg/dL — ABNORMAL HIGH (ref 70–99)
Potassium: 4.1 mmol/L (ref 3.5–5.1)
Sodium: 139 mmol/L (ref 135–145)
Total Bilirubin: 0.9 mg/dL (ref 0.3–1.2)
Total Protein: 5.7 g/dL — ABNORMAL LOW (ref 6.5–8.1)

## 2018-05-05 MED ORDER — ORAL CARE MOUTH RINSE: 15.0000 mL | Freq: Two times a day (BID) | OROMUCOSAL | Status: DC

## 2018-05-05 MED ORDER — INSULIN DETEMIR 100 UNIT/ML ~~LOC~~ SOLN
15.0000 [IU] | Freq: Every day | SUBCUTANEOUS | Status: DC
  Administered 2018-05-05 – 2018-05-10 (×6): 15 [IU] via SUBCUTANEOUS
  Filled 2018-05-05 (×7): qty 0.15

## 2018-05-05 MED ORDER — INSULIN DETEMIR 100 UNIT/ML ~~LOC~~ SOLN: 15.0000 [IU] | Freq: Every day | SUBCUTANEOUS | Status: DC

## 2018-05-05 MED ORDER — TRAMADOL HCL 50 MG PO TABS: 50.0000 mg | ORAL_TABLET | ORAL | Status: DC | PRN

## 2018-05-05 MED ORDER — INSULIN ASPART 100 UNIT/ML ~~LOC~~ SOLN
0.0000 [IU] | Freq: Three times a day (TID) | SUBCUTANEOUS | Status: DC
  Administered 2018-05-06: 2 [IU] via SUBCUTANEOUS

## 2018-05-05 NOTE — Progress Notes (Signed)
Patient ID: Jimmy Collins, male   DOB: October 26, 1960, 58 y.o.   MRN: [id number] [facility] Evening Rounds:  Hemodynamically stable in sinus rhythm. No problems today. Will remove foley.

## 2018-05-05 NOTE — Progress Notes (Signed)
2 Days Post-Op Procedure(s) (LRB): CORONARY ARTERY BYPASS GRAFTING (CABG), ON PUMP, TIMES TWO, USING LEFT INTERNAL MAMMARY ARTERY AND ENDOSCOPICALLY HARVESTED LEFT GREATER SAPHENOUS VEIN (N/A) TRANSESOPHAGEAL ECHOCARDIOGRAM (TEE) (N/A) Subjective: Sore but otherwise ok   Objective: Vital signs in last 24 hours: Temp:  [97.7 F (36.5 C)-100.6 F (38.1 C)] 98.4 F (36.9 C) (02/23 1036) Pulse Rate:  [79-93] 79 (02/23 1036) Cardiac Rhythm: Atrial paced (02/23 0800) Resp:  [11-36] 16 (02/23 1036) BP: (95-127)/(56-85) 125/60 (02/23 0934) SpO2:  [9 %-98 %] 92 % (02/23 1036) Arterial Line BP: (97-146)/(51-76) 110/66 (02/23 1036) FiO2 (%):  [40 %] 40 % (02/22 1436) Weight:  [102.3 kg] 102.3 kg (02/23 0657)  Hemodynamic parameters for last 24 hours: PAP: (20-51)/(8-27) 30/19 CO:  [4.3 L/min-7.2 L/min] 7.2 L/min CI:  [2.1 L/min/m2-8 L/min/m2] 3.5 L/min/m2  Intake/Output from previous day: 02/22 0701 - 02/23 0700 In: 3178.8 [P.O.:600; I.V.:1414.5; NG/GT:60; IV Piggyback:1104.3] Out: 2885 [Urine:2435; Chest Tube:450] Intake/Output this shift: Total I/O In: 65 [I.V.:65] Out: 680 [Urine:600; Chest Tube:80]  General appearance: alert and cooperative Neurologic: intact Heart: regular rate and rhythm, S1, S2 normal, no murmur, click, rub or gallop Lungs: clear to auscultation bilaterally Extremities: edema mild Wound: dressings dry  Lab Results: Recent Labs    05/04/18 1902 05/05/18 0510 05/05/18 0526  WBC 16.7* 17.4*  --   HGB 12.3* 12.1* 15.6  HCT 36.5* 36.1* 46.0  PLT 164 145*  --    BMET:  Recent Labs    05/04/18 1902 05/05/18 0510 05/05/18 0526  NA 140 139 141  K 3.4* 4.1 4.0  CL 109 106  --   CO2 24 22  --   GLUCOSE 153* 119*  --   BUN 15 19  --   CREATININE 0.99 1.17  --   CALCIUM 7.6* 7.7*  --     PT/INR:  Recent Labs    05/04/18 0030  LABPROT 17.0*  INR 1.40   ABG    Component Value Date/Time   PHART 7.350 05/05/2018 0526   HCO3 26.1 05/05/2018  0526   TCO2 28 05/05/2018 0526   ACIDBASEDEF 1.0 05/03/2018 2245   O2SAT 94.0 05/05/2018 0526   CBG (last 3)  Recent Labs    05/05/18 0600 05/05/18 0817 05/05/18 1024  GLUCAP 132* 109* 159*   CXR: clear  Assessment/Plan: S/P Procedure(s) (LRB): CORONARY ARTERY BYPASS GRAFTING (CABG), ON PUMP, TIMES TWO, USING LEFT INTERNAL MAMMARY ARTERY AND ENDOSCOPICALLY HARVESTED LEFT GREATER SAPHENOUS VEIN (N/A) TRANSESOPHAGEAL ECHOCARDIOGRAM (TEE) (N/A)  POD 2 Hemodynamically stable in sinus rhythm. Continue Lopressor. DC arterial line.  Mild volume excess: continue diuresis.  DC CT's  DM: no hx of DM but preop Hgb A1c was 6.0. Still on insulin drip. Switch to Levemir and SSI. He should be able to be controlled with dietary modification and exercise. His wife says he eats a lot of snacks and junk food.  Continue ambulation and IS.   LOS: 3 days    Alleen Borne 05/05/2018

## 2018-05-06 ENCOUNTER — Telehealth: Payer: Self-pay | Admitting: Cardiovascular Disease

## 2018-05-06 ENCOUNTER — Inpatient Hospital Stay (HOSPITAL_COMMUNITY): Payer: Managed Care, Other (non HMO)

## 2018-05-06 ENCOUNTER — Encounter (HOSPITAL_COMMUNITY): Payer: Self-pay | Admitting: Cardiothoracic Surgery

## 2018-05-06 LAB — CBC
HCT: 33.9 % — ABNORMAL LOW (ref 39.0–52.0)
Hemoglobin: 11 g/dL — ABNORMAL LOW (ref 13.0–17.0)
MCH: 28.8 pg (ref 26.0–34.0)
MCHC: 32.4 g/dL (ref 30.0–36.0)
MCV: 88.7 fL (ref 80.0–100.0)
Platelets: 148 10*3/uL — ABNORMAL LOW (ref 150–400)
RBC: 3.82 MIL/uL — ABNORMAL LOW (ref 4.22–5.81)
RDW: 14 % (ref 11.5–15.5)
WBC: 17 10*3/uL — ABNORMAL HIGH (ref 4.0–10.5)
nRBC: 0 % (ref 0.0–0.2)

## 2018-05-06 LAB — BASIC METABOLIC PANEL
Anion gap: 8 (ref 5–15)
BUN: 15 mg/dL (ref 6–20)
CO2: 28 mmol/L (ref 22–32)
Calcium: 8.3 mg/dL — ABNORMAL LOW (ref 8.9–10.3)
Chloride: 100 mmol/L (ref 98–111)
Creatinine, Ser: 0.87 mg/dL (ref 0.61–1.24)
GFR calc Af Amer: >60 mL/min (ref >60)
GFR calc non Af Amer: >60 mL/min (ref >60)
Glucose, Bld: 133 mg/dL — ABNORMAL HIGH (ref 70–99)
Potassium: 3.7 mmol/L (ref 3.5–5.1)
Sodium: 136 mmol/L (ref 135–145)

## 2018-05-06 LAB — GLUCOSE, CAPILLARY
Glucose-Capillary: 132 mg/dL — ABNORMAL HIGH (ref 70–99)
Glucose-Capillary: 117 mg/dL — ABNORMAL HIGH (ref 70–99)
Glucose-Capillary: 103 mg/dL — ABNORMAL HIGH (ref 70–99)
Glucose-Capillary: 77 mg/dL (ref 70–99)

## 2018-05-06 MED ORDER — METOPROLOL TARTRATE 25 MG/10 ML ORAL SUSPENSION
12.5000 mg | Freq: Two times a day (BID) | ORAL | Status: DC
  Filled 2018-05-06 (×6): qty 5

## 2018-05-06 MED ORDER — METOPROLOL TARTRATE 25 MG PO TABS
25.0000 mg | ORAL_TABLET | Freq: Two times a day (BID) | ORAL | Status: DC
  Administered 2018-05-06 – 2018-05-10 (×9): 25 mg via ORAL
  Filled 2018-05-06 (×9): qty 1

## 2018-05-06 MED ORDER — POTASSIUM CHLORIDE CRYS ER 20 MEQ PO TBCR
20.0000 meq | EXTENDED_RELEASE_TABLET | ORAL | Status: AC
  Administered 2018-05-06 (×3): 20 meq via ORAL
  Filled 2018-05-06 (×3): qty 1

## 2018-05-06 MED ORDER — LEVALBUTEROL HCL 1.25 MG/0.5ML IN NEBU
1.2500 mg | INHALATION_SOLUTION | Freq: Two times a day (BID) | RESPIRATORY_TRACT | Status: DC
  Administered 2018-05-07 – 2018-05-10 (×7): 1.25 mg via RESPIRATORY_TRACT
  Filled 2018-05-06 (×7): qty 0.5

## 2018-05-06 NOTE — Telephone Encounter (Signed)
Spoke with the pts wife, Lupita Leash on [place], and she has paperwork from the pts employer that needs initial information re: his CABG filled out..his surgery was 05/03/18 and he is still in the ICU... I advised her to take the papers to the hospital with her and to ask the nurse if she can help get the papers to the Cardiothoracic surgeon for help getting them filled out.. she agreed and will call back if she has any problems.

## 2018-05-06 NOTE — Op Note (Signed)
NAMEABDULLA, SARRACINO MEDICAL RECORD NF:[id number] ACCOUNT [id number] DATE OF BIRTH:1960/06/28 FACILITY: [facility] LOCATION: [facility] PHYSICIAN:PETER VAN TRIGT III, MD  OPERATIVE REPORT  DATE OF PROCEDURE:  05/06/2018  OPERATION: 1.  Urgent coronary artery bypass grafting x2 (left internal mammary artery to left anterior descending, saphenous vein graft to obtuse marginal 1). 2.  Bilateral saphenous vein harvest with the left leg.  Endoscopic vein harvesting saphenous vein used for the conduit to the circumflex.  The right leg saphenous vein was not adequate for conduit.  SURGEON:  Kerin Perna, MD  ASSISTANT:  Jari Favre PA-C.  ANESTHESIA:  General by Dr. Eilene Ghazi.  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  POSTOPERATIVE DIAGNOSES:  Unstable angina with 95% distal left main stenosis, preserved ventricular function.  CLINICAL NOTE:  PROCEDURE:  The patient was brought from preop holding to the operating room after informed consent had been obtained and final surgical issues addressed.  The patient presented with symptoms of unstable angina and had undergone cardiac  catheterization by cardiology, finding a 95% stenosis of the left main.  Surgical revascularization was recommended.  Following his admission, the patient remained stable and his preoperative studies were performed, which showed no problems with other  significant vascular disease or lung disease.  The benefits and risks of the procedure have been discussed in detail with the patient including the risks of bleeding, stroke, infection, organ failure, death.  The patient understood that because of his  heavy smoking history, he was at increased risk for postoperative pulmonary problems including oxygen requirement, prolonged ventilator dependence, and pneumonia.  DESCRIPTION OF PROCEDURE:  A transesophageal echo probe was placed after the patient was  induced for general anesthesia.  He remained stable.  The echo confirmed the preoperative diagnosis of normal LV function, no significant valvular disease.  The  patient was prepped and draped as a sterile field.  A proper time-out was performed.  A sternal incision was made as the saphenous vein was harvested.  It was first harvested from the right leg, but was found to be too thin, small and fragile.  The vein  was then harvested from the left thigh endoscopically and found to be a good conduit.  The internal mammary artery was a good vessel with excellent flow.  The sternal retractor was placed and the pericardium suspended.  Pursestrings were placed in the ascending aorta and right atrium and the patient was placed on cardiopulmonary bypass.  The coronaries were identified for grafting.  The LAD was found to be  diffusely diseased, but inadequate location of the anastomosis distally.  The OM branch was intramyocardial and heavily diseased, but found to be an adequate target, but also somewhat friable.  Cardioplegia cannulas replaced both antegrade and  retrograde cold blood cardioplegia, and the crossclamp was applied.  A liter of cold blood cardioplegia was delivered in split doses and supple temperature dropped less than 14 degrees.  Cardioplegia was delivered every 20 minutes.  The distal coronary anastomoses were performed.  The first distal anastomosis was to the OM1.  This was a 1.5 mm vessel, proximal 95% left main stenosis.  Reverse saphenous vein was sewn end-to-side with running 7-0 Prolene.  There was good flow through  the graft.  Cardioplegia was redosed.  The second distal anastomosis was the distal LAD using the left IMA.  This was brought through an opening in the left lateral pericardium and was brought down onto the  LAD vessel and sewn end-to-side with running 8-0 Prolene.  There was good flow through  the anastomosis The bulldog was briefly released to confirm adequate flow, and  then reapplied and the pedicle was secured to the epicardium and 6-0 Prolenes.  Cardioplegia was redosed.  The proximal anastomoses of the vein was then sewn to the ascending aorta using a 4.5 mm punch and running 6-0 Prolene.  Air was vented from the coronaries with a dose of retrograde warm blood cardioplegia type lesion and the crossclamp was removed.  The heart resumed a spontaneous rhythm.  The patient was rewarmed and reperfused.  Temporary pacing wires were applied.  The lungs were expanded.  The patient was placed on dose inotropes and weaned from cardiopulmonary bypass.  Cardiac function appeared  to be normal.  However, oxygen saturation rather quickly dropped in the low 80s for a saturation percentage and then the patient was then placed back on bypass.  Heparin had not been reversed.    The rest of the patient on bypass.  Anesthesia team then worked on irrigating the lungs, suctioning the lungs, recruiting the lungs and applying a bronchonebulizer therapy.  PEEP valve was applied to the ventilator circuit and then the patient was  transitioned off cardiopulmonary bypass again using a low-dose inotropes at this time, oxygen saturations remained above 90% and the patient transitioned off with normal LV function.  Protamine was administered this time without adverse reaction.  The  cannulas were removed.  The patient was watched remained stable hemodynamically as well as with stable oxygen saturation.  The mediastinum was irrigated.  The superior pericardial fat was closed.  The anterior and bilateral pleural tubes were placed.   The sternum was closed.  The patient remained stable.  The pectoralis fascia was closed.  The subcutaneous and skin layers were closed with a running Vicryl.    Total cardiopulmonary bypass time was 198 minutes.  AN/NUANCE  D:05/06/2018 T:05/06/2018 JOB:[id number]

## 2018-05-06 NOTE — Plan of Care (Signed)
  Problem: Education: Goal: Knowledge of General Education information will improve Description Including pain rating scale, medication(s)/side effects and non-pharmacologic comfort measures Outcome: Progressing   Problem: Health Behavior/Discharge Planning: Goal: Ability to manage health-related needs will improve Outcome: Progressing   Problem: Clinical Measurements: Goal: Ability to maintain clinical measurements within normal limits will improve Outcome: Progressing Goal: Will remain free from infection Outcome: Progressing Goal: Diagnostic test results will improve Outcome: Progressing Goal: Respiratory complications will improve Outcome: Progressing Goal: Cardiovascular complication will be avoided Outcome: Progressing   Problem: Activity: Goal: Risk for activity intolerance will decrease Outcome: Progressing   Problem: Nutrition: Goal: Adequate nutrition will be maintained Outcome: Progressing   Problem: Coping: Goal: Level of anxiety will decrease Outcome: Progressing   Problem: Elimination: Goal: Will not experience complications related to bowel motility Outcome: Progressing Goal: Will not experience complications related to urinary retention Outcome: Progressing   Problem: Pain Managment: Goal: General experience of comfort will improve Outcome: Progressing   Problem: Safety: Goal: Ability to remain free from injury will improve Outcome: Progressing   Problem: Skin Integrity: Goal: Risk for impaired skin integrity will decrease Outcome: Progressing   Problem: Cardiac: Goal: Will achieve and/or maintain hemodynamic stability Outcome: Progressing   Problem: Clinical Measurements: Goal: Postoperative complications will be avoided or minimized Outcome: Progressing   Problem: Respiratory: Goal: Respiratory status will improve Outcome: Progressing   Problem: Urinary Elimination: Goal: Ability to achieve and maintain adequate renal perfusion and  functioning will improve Outcome: Progressing   Problem: Education: Goal: Will demonstrate proper wound care and an understanding of methods to prevent future damage Outcome: Progressing Goal: Knowledge of disease or condition will improve Outcome: Progressing Goal: Knowledge of the prescribed therapeutic regimen will improve Outcome: Progressing Goal: Individualized Educational Video(s) Outcome: Progressing   Problem: Activity: Goal: Risk for activity intolerance will decrease Outcome: Progressing   Problem: Cardiac: Goal: Will achieve and/or maintain hemodynamic stability Outcome: Progressing   Problem: Clinical Measurements: Goal: Postoperative complications will be avoided or minimized Outcome: Progressing   Problem: Respiratory: Goal: Respiratory status will improve Outcome: Progressing   Problem: Skin Integrity: Goal: Wound healing without signs and symptoms of infection Outcome: Progressing Goal: Risk for impaired skin integrity will decrease Outcome: Progressing   Problem: Urinary Elimination: Goal: Ability to achieve and maintain adequate renal perfusion and functioning will improve Outcome: Progressing

## 2018-05-06 NOTE — Telephone Encounter (Signed)
New Message   PTs wife is calling and has family medical leave paperwork that needs to be filled out by the Dr.  PTs wife is sending the paperwork via fax.  Please call her back

## 2018-05-06 NOTE — Progress Notes (Signed)
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, nebs nsr Objective: Vital signs in last 24 hours: Temp:  [97.9 F (36.6 C)-98.8 F (37.1 C)] 98.3 F (36.8 C) (02/24 0747) Pulse Rate:  [74-101] 101 (02/24 0700) Cardiac Rhythm: Normal sinus rhythm (02/24 0400) Resp:  [11-37] 32 (02/24 0700) BP: (107-146)/(60-87) 110/62 (02/24 0700) SpO2:  [90 %-99 %] 90 % (02/24 0742) Arterial Line BP: (107-146)/(59-96) 127/69 (02/23 1130) Weight:  [101.3 kg] 101.3 kg (02/24 0500)  Hemodynamic parameters for last 24 hours: PAP: (26-39)/(11-27) 31/19 CO:  [7.2 L/min] 7.2 L/min CI:  [3.5 L/min/m2] 3.5 L/min/m2  Intake/Output from previous day: 02/23 0701 - 02/24 0700 In: 721.7 [P.O.:600; I.V.:121.7] Out: 1605 [Urine:1525; Chest Tube:80] Intake/Output this shift: No intake/output data recorded.       Exam    General- alert and comfortable    Neck- no JVD, no cervical adenopathy palpable, no carotid bruit   Lungs- scattered rhonchi wheezes   Cor- regular rate and rhythm, no murmur , gallop   Abdomen- soft, non-tender   Extremities - warm, non-tender, minimal edema   Neuro- oriented, appropriate, no focal weakness   Lab Results: Recent Labs    05/05/18 0510 05/05/18 0526 05/06/18 0501  WBC 17.4*  --  17.0*  HGB 12.1* 15.6 11.0*  HCT 36.1* 46.0 33.9*  PLT 145*  --  148*   BMET:  Recent Labs    05/05/18 0510 05/05/18 0526 05/06/18 0501  NA 139 141 136  K 4.1 4.0 3.7  CL 106  --  100  CO2 22  --  28  GLUCOSE 119*  --  133*  BUN 19  --  15  CREATININE 1.17  --  0.87  CALCIUM 7.7*  --  8.3*    PT/INR:  Recent Labs    05/04/18 0030  LABPROT 17.0*  INR 1.40   ABG    Component Value Date/Time   PHART 7.350 05/05/2018 0526   HCO3  26.1 05/05/2018 0526   TCO2 28 05/05/2018 0526   ACIDBASEDEF 1.0 05/03/2018 2245   O2SAT 94.0 05/05/2018 0526   CBG (last 3)  Recent Labs    05/05/18 1631 05/05/18 2210 05/06/18 0657  GLUCAP 108* 122* 132*    Assessment/Plan: S/P Procedure(s) (LRB): CORONARY ARTERY BYPASS GRAFTING (CABG), ON PUMP, TIMES TWO, USING LEFT INTERNAL MAMMARY ARTERY AND ENDOSCOPICALLY HARVESTED LEFT GREATER SAPHENOUS VEIN (N/A) TRANSESOPHAGEAL ECHOCARDIOGRAM (TEE) (N/A) Mobilize Diuresis d/c tubes/lines keep in ICU for pulmonary care   LOS: 4 days    Kathlee Nations Trigt III 05/06/2018

## 2018-05-06 NOTE — Progress Notes (Signed)
TCTS BRIEF SICU PROGRESS NOTE  3 Days Post-Op  S/P Procedure(s) (LRB): CORONARY ARTERY BYPASS GRAFTING (CABG), ON PUMP, TIMES TWO, USING LEFT INTERNAL MAMMARY ARTERY AND ENDOSCOPICALLY HARVESTED LEFT GREATER SAPHENOUS VEIN (N/A) TRANSESOPHAGEAL ECHOCARDIOGRAM (TEE) (N/A)   Stable day NSR w/ stable BP Breathing comfortably on 3 L/min via [place] UOP adequate  Plan: Continue current plan  Purcell Nails, MD 05/06/2018 6:23 PM

## 2018-05-07 ENCOUNTER — Inpatient Hospital Stay (HOSPITAL_COMMUNITY): Payer: Managed Care, Other (non HMO)

## 2018-05-07 LAB — BASIC METABOLIC PANEL
Anion gap: 8 (ref 5–15)
BUN: 15 mg/dL (ref 6–20)
CO2: 30 mmol/L (ref 22–32)
Calcium: 8.7 mg/dL — ABNORMAL LOW (ref 8.9–10.3)
Chloride: 100 mmol/L (ref 98–111)
Creatinine, Ser: 0.82 mg/dL (ref 0.61–1.24)
GFR calc Af Amer: >60 mL/min (ref >60)
GFR calc non Af Amer: >60 mL/min (ref >60)
Glucose, Bld: 116 mg/dL — ABNORMAL HIGH (ref 70–99)
Potassium: 3.9 mmol/L (ref 3.5–5.1)
Sodium: 138 mmol/L (ref 135–145)

## 2018-05-07 LAB — GLUCOSE, CAPILLARY
Glucose-Capillary: 117 mg/dL — ABNORMAL HIGH (ref 70–99)
Glucose-Capillary: 101 mg/dL — ABNORMAL HIGH (ref 70–99)
Glucose-Capillary: 99 mg/dL (ref 70–99)
Glucose-Capillary: 87 mg/dL (ref 70–99)

## 2018-05-07 LAB — CBC
HCT: 30.8 % — ABNORMAL LOW (ref 39.0–52.0)
Hemoglobin: 10 g/dL — ABNORMAL LOW (ref 13.0–17.0)
MCH: 28.4 pg (ref 26.0–34.0)
MCHC: 32.5 g/dL (ref 30.0–36.0)
MCV: 87.5 fL (ref 80.0–100.0)
Platelets: 164 10*3/uL (ref 150–400)
RBC: 3.52 MIL/uL — ABNORMAL LOW (ref 4.22–5.81)
RDW: 13.9 % (ref 11.5–15.5)
WBC: 15.1 10*3/uL — ABNORMAL HIGH (ref 4.0–10.5)
nRBC: 0 % (ref 0.0–0.2)

## 2018-05-07 MED ORDER — POTASSIUM CHLORIDE CRYS ER 20 MEQ PO TBCR
20.0000 meq | EXTENDED_RELEASE_TABLET | Freq: Two times a day (BID) | ORAL | Status: DC
  Administered 2018-05-07 – 2018-05-10 (×7): 20 meq via ORAL
  Filled 2018-05-07 (×7): qty 1

## 2018-05-07 MED ORDER — SORBITOL 70 % PO SOLN
30.0000 mL | Freq: Every morning | ORAL | Status: AC
  Filled 2018-05-07: qty 30

## 2018-05-07 MED ORDER — GUAIFENESIN ER 600 MG PO TB12
1200.0000 mg | ORAL_TABLET | Freq: Two times a day (BID) | ORAL | Status: DC
  Administered 2018-05-07 – 2018-05-10 (×7): 1200 mg via ORAL
  Filled 2018-05-07 (×7): qty 2

## 2018-05-07 MED ORDER — FUROSEMIDE 10 MG/ML IJ SOLN
40.0000 mg | Freq: Every day | INTRAMUSCULAR | Status: DC
  Administered 2018-05-08: 40 mg via INTRAVENOUS
  Filled 2018-05-07: qty 4

## 2018-05-07 MED FILL — Calcium Chloride Inj 10%: INTRAVENOUS | Qty: 10 | Status: AC

## 2018-05-07 MED FILL — Heparin Sodium (Porcine) Inj 1000 Unit/ML: INTRAMUSCULAR | Qty: 30 | Status: AC

## 2018-05-07 MED FILL — Electrolyte-R (PH 7.4) Solution: INTRAVENOUS | Qty: 4000 | Status: AC

## 2018-05-07 MED FILL — Lidocaine HCl(Cardiac) IV PF Soln Pref Syr 100 MG/5ML (2%): INTRAVENOUS | Qty: 5 | Status: AC

## 2018-05-07 MED FILL — Mannitol IV Soln 20%: INTRAVENOUS | Qty: 500 | Status: AC

## 2018-05-07 MED FILL — Sodium Bicarbonate IV Soln 8.4%: INTRAVENOUS | Qty: 50 | Status: AC

## 2018-05-07 MED FILL — Magnesium Sulfate Inj 50%: INTRAMUSCULAR | Qty: 10 | Status: AC

## 2018-05-07 MED FILL — Potassium Chloride Inj 2 mEq/ML: INTRAVENOUS | Qty: 40 | Status: AC

## 2018-05-07 MED FILL — Sodium Chloride IV Soln 0.9%: INTRAVENOUS | Qty: 2000 | Status: AC

## 2018-05-07 NOTE — Progress Notes (Signed)
Patient ID: Jimmy Collins, male   DOB: [birth year], 58 y.o.   MRN: [id number] EVENING ROUNDS NOTE :     [address]       [place] [address]             [phone]                 4 Days Post-Op Procedure(s) (LRB): CORONARY ARTERY BYPASS GRAFTING (CABG), ON PUMP, TIMES TWO, USING LEFT INTERNAL MAMMARY ARTERY AND ENDOSCOPICALLY HARVESTED LEFT GREATER SAPHENOUS VEIN (N/A) TRANSESOPHAGEAL ECHOCARDIOGRAM (TEE) (N/A)  Total Length of Stay:  LOS: 5 days  BP 113/71   Pulse 80   Temp 98.5 F (36.9 C) (Oral)   Resp 14   Ht 5\' 5"  (1.651 m)   Wt 99.1 kg   SpO2 91%   BMI 36.36 kg/m   .Intake/Output      02/24 0701 - 02/25 0700 02/25 0701 - 02/26 0700   P.O. 360 600   I.V. (mL/kg)     Total Intake(mL/kg) 360 (3.6) 600 (6.1)   Urine (mL/kg/hr) 2265 (1) 1100 (0.9)   Stool 0 0   Chest Tube     Total Output 2265 1100   Net -1905 -500        Stool Occurrence 2 x 1 x        Lab Results  Component Value Date   WBC 15.1 (H) 05/07/2018   HGB 10.0 (L) 05/07/2018   HCT 30.8 (L) 05/07/2018   PLT 164 05/07/2018   GLUCOSE 116 (H) 05/07/2018   CHOL 204 (H) 01/28/2018   TRIG 156 (H) 01/28/2018   HDL 31 (L) 01/28/2018   LDLCALC 144 (H) 01/28/2018   ALT 28 05/05/2018   AST 50 (H) 05/05/2018   NA 138 05/07/2018   K 3.9 05/07/2018   CL 100 05/07/2018   CREATININE 0.82 05/07/2018   BUN 15 05/07/2018   CO2 30 05/07/2018   TSH 0.554 05/03/2018   PSA 0.4 01/28/2018   INR 1.40 05/04/2018   HGBA1C 6.0 (H) 05/03/2018   cabg last week ,waiting for transfer to stepdown No a fib    Delight Ovens MD  Beeper [phone] Office [phone] 05/07/2018 6:51 PM

## 2018-05-07 NOTE — Progress Notes (Addendum)
TCTS DAILY ICU PROGRESS NOTE                   [address]            [place] [address]          [phone]   4 Days Post-Op Procedure(s) (LRB): CORONARY ARTERY BYPASS GRAFTING (CABG), ON PUMP, TIMES TWO, USING LEFT INTERNAL MAMMARY ARTERY AND ENDOSCOPICALLY HARVESTED LEFT GREATER SAPHENOUS VEIN (N/A) TRANSESOPHAGEAL ECHOCARDIOGRAM (TEE) (N/A)  Total Length of Stay:  LOS: 5 days   Subjective: Feels okay this morning. His pain is well controlled on the oral medication although his chest hurts more when he coughs.   Objective: Vital signs in last 24 hours: Temp:  [97.9 F (36.6 C)-98.7 F (37.1 C)] 98.6 F (37 C) (02/25 0700) Pulse Rate:  [71-103] 88 (02/25 0618) Cardiac Rhythm: Normal sinus rhythm (02/25 0330) Resp:  [12-31] 27 (02/25 0618) BP: (90-126)/(63-87) 115/67 (02/25 0618) SpO2:  [89 %-99 %] 93 % (02/25 0751) Weight:  [99.1 kg] 99.1 kg (02/25 0500)  Filed Weights   05/05/18 0657 05/06/18 0500 05/07/18 0500  Weight: 102.3 kg 101.3 kg 99.1 kg    Weight change: -2.2 kg      Intake/Output from previous day: 02/24 0701 - 02/25 0700 In: 360 [P.O.:360] Out: 2265 [Urine:2265]  Intake/Output this shift: No intake/output data recorded.  Current Meds: Scheduled Meds: . acetaminophen  1,000 mg Oral Q6H   Or  . acetaminophen (TYLENOL) oral liquid 160 mg/5 mL  1,000 mg Per Tube Q6H  . aspirin EC  325 mg Oral Daily   Or  . aspirin  324 mg Per Tube Daily  . bisacodyl  10 mg Oral Daily   Or  . bisacodyl  10 mg Rectal Daily  . docusate sodium  200 mg Oral Daily  . [START ON 05/08/2018] furosemide  40 mg Intravenous Daily  . insulin aspart  0-24 Units Subcutaneous TID WC  . insulin detemir  15 Units Subcutaneous Daily  . levalbuterol  1.25 mg Nebulization BID  . mouth rinse  15 mL Mouth Rinse BID  . metoprolol tartrate  25 mg Oral BID   Or  . metoprolol tartrate  12.5 mg Per Tube BID  . mometasone-formoterol  2 puff Inhalation BID  .  pantoprazole  40 mg Oral Daily  . potassium chloride  20 mEq Oral BID  . rosuvastatin  20 mg Oral QHS  . sodium chloride flush  3 mL Intravenous Q12H  . sorbitol  30 mL Oral q morning - 10a   Continuous Infusions: PRN Meds:.acetaminophen, levalbuterol, metoprolol tartrate, ondansetron (ZOFRAN) IV, oxyCODONE, sodium chloride flush, traMADol  General appearance: alert, cooperative and no distress Heart: regular rate and rhythm, S1, S2 normal, no murmur, click, rub or gallop Lungs: clear to auscultation bilaterally Abdomen: soft, non-tender; bowel sounds normal; no masses,  no organomegaly Extremities: edema in the upper and lower extremities.  Wound: clean and dry dressed with a sterile dressing  Lab Results: CBC: Recent Labs    05/06/18 0501 05/07/18 0249  WBC 17.0* 15.1*  HGB 11.0* 10.0*  HCT 33.9* 30.8*  PLT 148* 164   BMET:  Recent Labs    05/06/18 0501 05/07/18 0249  NA 136 138  K 3.7 3.9  CL 100 100  CO2 28 30  GLUCOSE 133* 116*  BUN 15 15  CREATININE 0.87 0.82  CALCIUM 8.3* 8.7*    CMET: Lab Results  Component Value Date  WBC 15.1 (H) 05/07/2018   HGB 10.0 (L) 05/07/2018   HCT 30.8 (L) 05/07/2018   PLT 164 05/07/2018   GLUCOSE 116 (H) 05/07/2018   CHOL 204 (H) 01/28/2018   TRIG 156 (H) 01/28/2018   HDL 31 (L) 01/28/2018   LDLCALC 144 (H) 01/28/2018   ALT 28 05/05/2018   AST 50 (H) 05/05/2018   NA 138 05/07/2018   K 3.9 05/07/2018   CL 100 05/07/2018   CREATININE 0.82 05/07/2018   BUN 15 05/07/2018   CO2 30 05/07/2018   TSH 0.554 05/03/2018   PSA 0.4 01/28/2018   INR 1.40 05/04/2018   HGBA1C 6.0 (H) 05/03/2018      PT/INR: No results for input(s): LABPROT, INR in the last 72 hours. Radiology: Dg Chest 2 View  Result Date: 05/07/2018 CLINICAL DATA:  Status post CABG. EXAM: CHEST - 2 VIEW COMPARISON:  05/06/2018 FINDINGS: Stable heart size and mediastinal contours. Interval right jugular sheath removal. Epicardial pacing wires remain. Stable  atelectasis in the left lower lung. No pneumothorax identified. There is no evidence of pulmonary edema, consolidation or pleural fluid. No bony abnormalities identified. IMPRESSION: No pneumothorax.  Stable left lower lung atelectasis. Electronically Signed   By: Irish Lack M.D.   On: 05/07/2018 08:22     Assessment/Plan: S/P Procedure(s) (LRB): CORONARY ARTERY BYPASS GRAFTING (CABG), ON PUMP, TIMES TWO, USING LEFT INTERNAL MAMMARY ARTERY AND ENDOSCOPICALLY HARVESTED LEFT GREATER SAPHENOUS VEIN (N/A) TRANSESOPHAGEAL ECHOCARDIOGRAM (TEE) (N/A)  1. CV-NSR in the 70s-90s, BP well controlled. No longer needing drips.  2. Pulm-tolerating 3L [place], good oxygen saturation. CXR stable, no pneumothorax and stable left lower lung atelectasis.  3. Renal-creatinine 0.82, electrolytes okay 4. H and H 10.0/30.8, expected acute blood loss anemia 5. Endo-blood glucose well controlled CBGs 103, 77, 117   Plan: Working on pulmonary status today. I ordered a flutter valve and mucinex to assist with secretions. He is encouraged to use his incentive spirometer hourly. Ambulate in the halls as tolerated. Eating well.    Sharlene Dory 05/07/2018 8:54 AM   Transfer to stepdown Wean O2 as tolerated and cont iv lasix patient examined and medical record reviewed,agree with above note. Kathlee Nations Trigt III 05/07/2018

## 2018-05-08 ENCOUNTER — Inpatient Hospital Stay (HOSPITAL_COMMUNITY): Payer: Managed Care, Other (non HMO)

## 2018-05-08 LAB — CBC
HCT: 30.4 % — ABNORMAL LOW (ref 39.0–52.0)
Hemoglobin: 10 g/dL — ABNORMAL LOW (ref 13.0–17.0)
MCH: 29.1 pg (ref 26.0–34.0)
MCHC: 32.9 g/dL (ref 30.0–36.0)
MCV: 88.4 fL (ref 80.0–100.0)
Platelets: 210 10*3/uL (ref 150–400)
RBC: 3.44 MIL/uL — ABNORMAL LOW (ref 4.22–5.81)
RDW: 14.2 % (ref 11.5–15.5)
WBC: 14.1 10*3/uL — ABNORMAL HIGH (ref 4.0–10.5)
nRBC: 0 % (ref 0.0–0.2)

## 2018-05-08 LAB — BASIC METABOLIC PANEL
Anion gap: 5 (ref 5–15)
BUN: 17 mg/dL (ref 6–20)
CO2: 28 mmol/L (ref 22–32)
Calcium: 8.5 mg/dL — ABNORMAL LOW (ref 8.9–10.3)
Chloride: 104 mmol/L (ref 98–111)
Creatinine, Ser: 0.83 mg/dL (ref 0.61–1.24)
GFR calc Af Amer: >60 mL/min (ref >60)
GFR calc non Af Amer: >60 mL/min (ref >60)
Glucose, Bld: 111 mg/dL — ABNORMAL HIGH (ref 70–99)
Potassium: 4.5 mmol/L (ref 3.5–5.1)
Sodium: 137 mmol/L (ref 135–145)

## 2018-05-08 LAB — GLUCOSE, CAPILLARY
Glucose-Capillary: 104 mg/dL — ABNORMAL HIGH (ref 70–99)
Glucose-Capillary: 97 mg/dL (ref 70–99)
Glucose-Capillary: 108 mg/dL — ABNORMAL HIGH (ref 70–99)
Glucose-Capillary: 109 mg/dL — ABNORMAL HIGH (ref 70–99)

## 2018-05-08 NOTE — Progress Notes (Signed)
Telephone SBAR report given to the nurse assigned room [phone].Will transport patient on O2 and Tele via wheelchair.

## 2018-05-08 NOTE — Discharge Instructions (Signed)

## 2018-05-08 NOTE — Discharge Summary (Addendum)
[address]       [place] [address]             [phone]      Physician Discharge Summary  Patient ID: Jimmy Collins MRN: [id number] DOB/AGE: Jun 09, 1960 58 y.o.  Admit date: 05/02/2018 Discharge date: 05/10/2018  Admission Diagnoses: Patient Active Problem List   Diagnosis Date Noted  . CAD (coronary artery disease) 05/02/2018  . Essential hypertension 04/30/2018  . Family history of heart disease 04/30/2018  . Unstable angina (HCC) 04/30/2018  . Dyslipidemia   . Smoker   . Mammalian Meat Allergy   . GERD (gastroesophageal reflux disease)   . GERD 04/28/2008  . NEPHROLITHIASIS, HX OF 04/28/2008    Discharge Diagnoses:  Active Problems:   Unstable angina (HCC)   CAD (coronary artery disease)   S/P CABG x 2   Discharged Condition: good  HPI:   59 year old obese nondiabetic smoker presents with symptoms of unstable angina, mainly exertional.  He was evaluated by [name] who recommended cardiac catheterization which was performed today.  This shows a 95% distal left main stenosis.  There is a moderate proximal 70 to 80% LAD stenosis.  The RCA is dominant and without significant disease.  LV ejection fraction is normal.  LVEDP is 14.  The patient is currently stable in the Cath Lab holding area without chest pain in sinus rhythm and with normal blood pressure.  Surgical coronary revascularization is recommended.  Chest x-ray, pre-CABG Dopplers, and blood work is pending.  Hospital Course:   On 05/03/2018 Jimmy Collins underwent an urgent coronary artery bypass grafting x2 by [name].  He tolerated procedure well was transferred to the surgical ICU for continued care.  He was extubated timely manner.  Postop day 1 he remained hemodynamically stable on low-dose norepinephrine and dopamine.  His renal function remained stable.  He had minimal chest tube output.  Later that day he was extubated and was awake and alert.  He does have a longstanding  history of smoking.  Postop day 2 we discontinued his arterial line and his chest tubes.  We continue diuresis for mild volume overload.  He remains on Levemir and rarely needs sliding scale insulin.  He was not a diabetic preoperatively and his hemoglobin A1c was 6.0.  He will need lots of education on diet modification but hopefully will not need insulin for home.  He remained in normal sinus rhythm and no longer was needing any drips at this time.  He remained on 3 L nasal cannula with good oxygen saturation.  He continued to utilize his incentive spirometer and a flutter valve was added.  We initiated Mucinex for assistance with secretions.  He was then stable to transfer to the telemetry floor for continued care.  On the floor, he was weaned down to 1 L nasal cannula.  His chest x-ray remained stable.  He did remain in normal sinus rhythm therefore we discontinued his epicardial pacing wires.  We continue to encourage pulmonary toilet for weaning of oxygen and respiratory status.  Today, he is tolerating room air, his incisions are healing well, he is ambulating with limited assistance, and he is ready for discharge home.   Consults: None  Significant Diagnostic Studies:   CLINICAL DATA:  Status post CABG.  EXAM: CHEST - 2 VIEW  COMPARISON:  05/06/2018  FINDINGS: Stable heart size and mediastinal contours. Interval right jugular sheath removal. Epicardial pacing wires remain. Stable atelectasis in  the left lower lung. No pneumothorax identified.  There is no evidence of pulmonary edema, consolidation or pleural fluid. No bony abnormalities identified.  IMPRESSION: No pneumothorax.  Stable left lower lung atelectasis.   Electronically Signed   By: Irish Lack M.D.   On: 05/07/2018 08:22   Treatments:  NAME: Jimmy Collins, Jimmy Collins MEDICAL RECORD [id number] ACCOUNT [id number] DATE OF BIRTH:1960/03/20 FACILITY: [facility] LOCATION: [facility] PHYSICIAN:PETER VAN TRIGT III,  MD  OPERATIVE REPORT  DATE OF PROCEDURE:  05/06/2018  OPERATION: 1.  Urgent coronary artery bypass grafting x2 (left internal mammary artery to left anterior descending, saphenous vein graft to obtuse marginal 1). 2.  Bilateral saphenous vein harvest with the left leg.  Endoscopic vein harvesting saphenous vein used for the conduit to the circumflex.  The right leg saphenous vein was not adequate for conduit.  SURGEON:  Kerin Perna, MD  ASSISTANT:  Jari Favre PA-C.  ANESTHESIA:  General by Dr. Eilene Ghazi.  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  POSTOPERATIVE DIAGNOSES:  Unstable angina with 95% distal left main stenosis, preserved ventricular function.  Discharge Exam: Blood pressure (!) 141/80, pulse 83, temperature 98.9 F (37.2 C), temperature source Oral, resp. rate (!) 22, height  (1.651 m), weight 92.9 kg, SpO2 90 %.   General appearance: alert, cooperative and no distress Heart: regular rate and rhythm Lungs: clear to auscultation bilaterally Abdomen: soft, non-tender; bowel sounds normal; no masses,  no organomegaly Extremities: edema trace Wound: clean and dry, ecchymosis LLE    Discharge disposition: 01-Home or Self Care   Discharge Medications: Allergies as of 05/10/2018      Reactions   Other Hives, Itching, Swelling   Mammalian Meat Allergy   Heparin    Shellfish Allergy Hives, Rash      Medication List    STOP taking these medications   predniSONE 50 MG tablet Commonly known as:  DELTASONE     TAKE these medications   acetaminophen 325 MG tablet Commonly known as:  TYLENOL Take 2 tablets (650 mg total) by mouth every 4 (four) hours as needed for headache or mild pain.   aspirin 325 MG EC tablet Take 1 tablet (325 mg total) by mouth daily.   diphenhydrAMINE 50 MG tablet Commonly known as:  BENADRYL Take 1 tablet (50 mg total) by mouth once for 1 dose.  Patient will need a prescription for Prednisone and very clear instructions (as follows): Prednisone 50 mg - take 13 hours prior to test Take another Prednisone 50 mg 7 hours prior to test Take another Prednisone 50 mg 1 hour prior to test Take Benadryl 50 mg 1 hour prior to test Patient must complete all four doses of above prophylactic medications. Patient will need a ride after test due to Benadryl. What changed:  additional instructions   EPINEPHrine 0.3 mg/0.3 mL Soaj injection Commonly known as:  EPIPEN 2-PAK Inject 0.3 mLs (0.3 mg total) into the muscle once as needed (for severe allergic reaction).   furosemide 40 MG tablet Commonly known as:  LASIX Take 1 tablet (40 mg total) by mouth daily.   guaiFENesin 600 MG 12 hr tablet Commonly known as:  MUCINEX Take 2 tablets (1,200 mg total) by mouth 2 (two) times daily as needed.   lansoprazole 30 MG capsule Commonly known as:  PREVACID Take 30 mg by mouth daily.   metoprolol succinate 25 MG 24 hr tablet Commonly known as:  TOPROL-XL Take 1 tablet (  25 mg total) by mouth daily. What changed:  when to take this   nitroGLYCERIN 0.4 MG SL tablet Commonly known as:  NITROSTAT Place 1 tablet (0.4 mg total) under the tongue every 5 (five) minutes as needed for chest pain.   oxyCODONE 5 MG immediate release tablet Commonly known as:  Oxy IR/ROXICODONE Take 1-2 tablets (5-10 mg total) by mouth every 4 (four) hours as needed for severe pain.   potassium chloride SA 20 MEQ tablet Commonly known as:  K-DUR,KLOR-CON Take 1 tablet (20 mEq total) by mouth daily.   rosuvastatin 20 MG tablet Commonly known as:  CRESTOR Take 1 tablet (20 mg total) by mouth daily. What changed:  when to take this      Follow-up Information    Donita Brooks, MD. Call in 1 day(s).   Specialty:  Family Medicine Contact information: [address] [place] [address] [phone]        Runell Gess, MD Follow up.   Specialties:   Cardiology, Radiology Why:  [name] 3/17 @3pm  ([facility])  Contact information: [address] [place] [address] [phone]        [facility] Follow up.   Specialty:  Cardiothoracic Surgery Why:  Your routine follow-up appointment is on 3/24 @2 :00pm. Please arrive at 1:30pm for a chest xray located at [facility] which is on the first floor of our building.  Contact information: [address] [place], [address] [place] [address] [phone]         The patient has been discharged on:   1.Beta Blocker:  Yes [ yes  ]                              No   [   ]                              If No, reason:  2.Ace Inhibitor/ARB: Yes [   ]                                     No  [  no  ]                                     If No, reason: titration of bb  3.Statin:   Yes [ yes  ]                  No  [   ]                  If No, reason:  4.Ecasa:  Yes  [ yes  ]                  No   [   ]                  If No, reason:   Signed:  Original Note by Jari Favre PA-C  Update by: Lowella Dandy, PA-C  05/10/2018, 8:27 AM   patient examined and medical record reviewed,agree with above note. Kathlee Nations Trigt III 05/10/2018

## 2018-05-08 NOTE — Progress Notes (Signed)
CARDIAC REHAB PHASE I   PRE:  Rate/Rhythm: 79 SR    BP: sitting 122/66    SaO2: 95 1L, 90 RA  MODE:  Ambulation: 470 ft   POST:  Rate/Rhythm: 94 SR    BP: sitting 137/73     SaO2: 93 1L   Pt in recliner, eager to walk and go to bed. Attempted to d/c O2. SaO2 90 RA at rest and 87 RA in hall after 100 ft. Able to keep SaO2 up on 1L for rest of walk. Pt steady, no assist needed. Doing well. Encouraged continued IS,  Flutter, walking and recliner.  [id number]  Harriet Masson CES, ACSM 05/08/2018 2:59 PM

## 2018-05-08 NOTE — Progress Notes (Signed)
Epicardial pacing wires removed per MD orders without difficulty.  Will continue to monitor. 

## 2018-05-08 NOTE — Progress Notes (Signed)
Nutrition Consult/Education Note  RD consulted for nutrition education regarding diabetes.   Lab Results  Component Value Date   HGBA1C 6.0 (H) 05/03/2018   RD provided "Carbohydrate Counting for People with Diabetes" handout from the Academy of Nutrition and Dietetics. Discussed different food groups and their effects on blood sugar, emphasizing carbohydrate-containing foods. Provided list of carbohydrates and recommended serving sizes of common foods.  Discussed importance of controlled and consistent carbohydrate intake throughout the day. Provided examples of ways to balance meals/snacks and encouraged intake of high-fiber, whole grain complex carbohydrates. Teach back method used.  Expect good compliance.  Body mass index is 35.62 kg/m. Pt meets criteria for Obesity Class II based on current BMI.  Current diet order is HH/Carbohydrate Modified, patient is consuming approximately 100% of meals at this time. Labs and medications reviewed.   No further nutrition interventions warranted at this time. If additional nutrition issues arise, please re-consult RD.  Maureen Chatters, RD, LDN Pager #: [phone] After-Hours Pager #: [phone]

## 2018-05-08 NOTE — Progress Notes (Addendum)
      [address]       [place] [address]             [phone]      5 Days Post-Op Procedure(s) (LRB): CORONARY ARTERY BYPASS GRAFTING (CABG), ON PUMP, TIMES TWO, USING LEFT INTERNAL MAMMARY ARTERY AND ENDOSCOPICALLY HARVESTED LEFT GREATER SAPHENOUS VEIN (N/A) TRANSESOPHAGEAL ECHOCARDIOGRAM (TEE) (N/A) Subjective: Just transferred from the ICU. Pain is well controlled. He plans to work more on his breathing today.   Objective: Vital signs in last 24 hours: Temp:  [98.1 F (36.7 C)-99.3 F (37.4 C)] 98.2 F (36.8 C) (02/26 0759) Pulse Rate:  [70-97] 97 (02/26 0759) Cardiac Rhythm: Normal sinus rhythm (02/26 0835) Resp:  [14-25] 20 (02/26 0759) BP: (96-130)/(62-75) 120/68 (02/26 0759) SpO2:  [91 %-98 %] 93 % (02/26 0824) Weight:  [97.1 kg] 97.1 kg (02/26 0759)    Intake/Output from previous day: 02/25 0701 - 02/26 0700 In: 840 [P.O.:840] Out: 1600 [Urine:1600] Intake/Output this shift: No intake/output data recorded.  General appearance: alert, cooperative and no distress Heart: regular rate and rhythm, S1, S2 normal, no murmur, click, rub or gallop Lungs: clear to auscultation bilaterally Abdomen: soft, non-tender; bowel sounds normal; no masses,  no organomegaly Extremities: extremities normal, atraumatic, no cyanosis or edema Wound: clean and dry  Lab Results: Recent Labs    05/07/18 0249 05/08/18 0225  WBC 15.1* 14.1*  HGB 10.0* 10.0*  HCT 30.8* 30.4*  PLT 164 210   BMET:  Recent Labs    05/07/18 0249 05/08/18 0225  NA 138 137  K 3.9 4.5  CL 100 104  CO2 30 28  GLUCOSE 116* 111*  BUN 15 17  CREATININE 0.82 0.83  CALCIUM 8.7* 8.5*    PT/INR: No results for input(s): LABPROT, INR in the last 72 hours. ABG    Component Value Date/Time   PHART 7.350 05/05/2018 0526   HCO3 26.1 05/05/2018 0526   TCO2 28 05/05/2018 0526   ACIDBASEDEF 1.0 05/03/2018 2245   O2SAT 94.0 05/05/2018 0526   CBG (last 3)  Recent Labs     05/07/18 1621 05/07/18 2121 05/08/18 0644  GLUCAP 99 87 104*    Assessment/Plan: S/P Procedure(s) (LRB): CORONARY ARTERY BYPASS GRAFTING (CABG), ON PUMP, TIMES TWO, USING LEFT INTERNAL MAMMARY ARTERY AND ENDOSCOPICALLY HARVESTED LEFT GREATER SAPHENOUS VEIN (N/A) TRANSESOPHAGEAL ECHOCARDIOGRAM (TEE) (N/A)  1. CV-NSR in the 80s, BP well controlled. Continue ASA, statin, and BB 2. Pulm-tolerating 1L [place], good oxygen saturation. CXR stable, no pneumothorax and stable left lower lung atelectasis.  3. Renal-creatinine 0.83, electrolytes okay 4. H and H 10.0//30.4, expected acute blood loss anemia 5. Endo-blood glucose well controlled CBGs 99,84,104. Continue Levemir  Plan: Discontinue EPW since rhythm has been stable. Continue flutter valve/incentive spirometer. Continue mucinex for secretions. Encouraged ambulation in the halls today. Wean oxygen as able.    LOS: 6 days    Sharlene Dory 05/08/2018    patient examined and medical record reviewed,agree with above note. Wean oxygen as tolerated-patient has severe COPD and may need home oxygen Kathlee Nations Trigt III 05/08/2018

## 2018-05-08 NOTE — Progress Notes (Signed)
Patient transferred to room 4E08 via wheelchair on tele and O2. Nurse and nursing assistant were at the bedside to accept the patient. Patient was A/Ox4 on 2L O2 and vital signs were stable. Patient's wife Radin Bukhari was notified of the patient being transferred to room 4E08.

## 2018-05-08 NOTE — Progress Notes (Addendum)
Inpatient Diabetes Program Recommendations  AACE/ADA: New Consensus Statement on Inpatient Glycemic Control  Target Ranges:  Prepandial:   less than 140 mg/dL      Peak postprandial:   less than 180 mg/dL (1-2 hours)      Critically ill patients:  140 - 180 mg/dL  Results for Jimmy Collins, Jimmy Collins (MRN [id number]) as of 05/08/2018 10:39  Ref. Range 05/07/2018 06:54 05/07/2018 11:04 05/07/2018 16:21 05/07/2018 21:21 05/08/2018 06:44  Glucose-Capillary Latest Ref Range: 70 - 99 mg/dL 797 (H) 282 (H) 99 87 060 (H)   Results for Jimmy Collins, Jimmy Collins (MRN [id number]) as of 05/08/2018 10:39  Ref. Range 05/03/2018 06:01  Hemoglobin A1C Latest Ref Range: 4.8 - 5.6 % 6.0 (H)   Review of Glycemic Control  Diabetes history: No Outpatient Diabetes medications: NA Current orders for Inpatient glycemic control: Levemir 15 units daily, Novolog 0-24 units QHS  Inpatient Diabetes Program Recommendations:  Insulin-Basal: Patient received Levemir 15 units on 05/07/18 and glucose ranged from 87-117 mg/dl on 06/07/59 and fasting glucose 104 mg/dl today.  If patient remains inpatient today, recommend discontinuing Levemir to see how glucose trends without basal insulin.  HgbA1C: A1C 6% on 05/03/18 indicating an average glucose of 126 mg/dl over the past 2-3 months. 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 Steroids likely contributed to elevated A1C.  Would recommend patient has A1C rechecked in 3 months.   Diet: Noted consult for diet modifications since patient is ordered Levemir. Will place consult for RD to provide diet education on Carb Modified diet.  Addendum 05/08/18@12 :47- Spoke with patient, his wife, and his mother (with patient permission) regarding glycemic control and A1C. Discussed insulin requirements following TCTS surgery and explained that he has been receiving insulin while inpatient to maintain glycemic control.  Discussed A1C 6% on 05/03/18 and  explained what an A1C is and discussed ADA criteria for dx of pre-DM and DM using A1C values. Also explained that he received Prednisone prior to admission which slightly contributed to hyperglycemia and elevated A1C. Encouraged patient to have A1C repeated by PCP in 3-4 months to ensure it is improved.  Patient notes that he drinks mainly [place]. Encouraged patient to eliminate sugary beverages from diet if possible.  Discussed carb modified diet and encouraged patient to follow carb modified diet to decrease carbohydrates being consumed and hopefully prevent development of DM in the future.  Provided handout information on carb modified diet and discussed nutrition label. Patient's wife states she typically does the shopping so she will start looking at carbohydrates in foods she purchases.   Informed patient that RD consult has been ordered as well for further education on carb modified diet.  Patient and his wife verbalized understanding of information discussed and they state they have no questions at this time.  Thanks, Orlando Penner, RN, MSN, CDE Diabetes Coordinator Inpatient Diabetes Program [phone] (Team Pager from 8am to 5pm)

## 2018-05-09 DIAGNOSIS — E782 Mixed hyperlipidemia: Secondary | ICD-10-CM

## 2018-05-09 LAB — GLUCOSE, CAPILLARY
Glucose-Capillary: 99 mg/dL (ref 70–99)
Glucose-Capillary: 89 mg/dL (ref 70–99)
Glucose-Capillary: 109 mg/dL — ABNORMAL HIGH (ref 70–99)
Glucose-Capillary: 106 mg/dL — ABNORMAL HIGH (ref 70–99)

## 2018-05-09 MED ORDER — FUROSEMIDE 40 MG PO TABS
40.0000 mg | ORAL_TABLET | Freq: Every day | ORAL | Status: DC
  Administered 2018-05-09 – 2018-05-10 (×2): 40 mg via ORAL
  Filled 2018-05-09 (×2): qty 1

## 2018-05-09 NOTE — Progress Notes (Signed)
Pt ambulated x 470 feet around unit with out assistive device Oxygen previous to walk was 92% after completion of walk 90%

## 2018-05-09 NOTE — Progress Notes (Addendum)
      [address]       [place] [address]             [phone]      6 Days Post-Op Procedure(s) (LRB): CORONARY ARTERY BYPASS GRAFTING (CABG), ON PUMP, TIMES TWO, USING LEFT INTERNAL MAMMARY ARTERY AND ENDOSCOPICALLY HARVESTED LEFT GREATER SAPHENOUS VEIN (N/A) TRANSESOPHAGEAL ECHOCARDIOGRAM (TEE) (N/A) Subjective: No issues overnight. He plans to ambulate more today.   Objective: Vital signs in last 24 hours: Temp:  [97.8 F (36.6 C)-98.9 F (37.2 C)] 97.8 F (36.6 C) (02/27 0737) Pulse Rate:  [81-97] 96 (02/27 0737) Cardiac Rhythm: Normal sinus rhythm (02/27 0753) Resp:  [14-26] 18 (02/27 0737) BP: (111-138)/(68-79) 111/74 (02/27 0737) SpO2:  [90 %-94 %] 90 % (02/27 0737) Weight:  [95.3 kg-97.1 kg] 95.3 kg (02/27 0416)  Intake/Output from previous day: 02/26 0701 - 02/27 0700 In: 480 [P.O.:480] Out: 2000 [Urine:2000] Intake/Output this shift: Total I/O In: -  Out: 200 [Urine:200]  General appearance: alert, cooperative and no distress Heart: regular rate and rhythm, S1, S2 normal, no murmur, click, rub or gallop Lungs: clear to auscultation bilaterally Abdomen: soft, non-tender; bowel sounds normal; no masses,  no organomegaly Extremities: extremities normal, atraumatic, no cyanosis or edema Wound: clean and dry  Lab Results: Recent Labs    05/07/18 0249 05/08/18 0225  WBC 15.1* 14.1*  HGB 10.0* 10.0*  HCT 30.8* 30.4*  PLT 164 210   BMET:  Recent Labs    05/07/18 0249 05/08/18 0225  NA 138 137  K 3.9 4.5  CL 100 104  CO2 30 28  GLUCOSE 116* 111*  BUN 15 17  CREATININE 0.82 0.83  CALCIUM 8.7* 8.5*    PT/INR: No results for input(s): LABPROT, INR in the last 72 hours. ABG    Component Value Date/Time   PHART 7.350 05/05/2018 0526   HCO3 26.1 05/05/2018 0526   TCO2 28 05/05/2018 0526   ACIDBASEDEF 1.0 05/03/2018 2245   O2SAT 94.0 05/05/2018 0526   CBG (last 3)  Recent Labs    05/08/18 1624 05/08/18 2148  05/09/18 0614  GLUCAP 108* 109* 99    Assessment/Plan: S/P Procedure(s) (LRB): CORONARY ARTERY BYPASS GRAFTING (CABG), ON PUMP, TIMES TWO, USING LEFT INTERNAL MAMMARY ARTERY AND ENDOSCOPICALLY HARVESTED LEFT GREATER SAPHENOUS VEIN (N/A) TRANSESOPHAGEAL ECHOCARDIOGRAM (TEE) (N/A)  1. CV-NSR in the 90s, BP well controlled. Continue ASA, statin, and BB 2. Pulm-tolerating room air with okay oxygen saturation, CXR stable, no pneumothorax and stable left lower lung atelectasis.  3. Renal-creatinine 0.83, electrolytes okay 4. H and H 10.0/30.4, expected acute blood loss anemia 5. Endo-blood glucose well controlled CBGs 108,109,99. Continue Levemir 6. COPD/previous smoker-he is quitting smoking. He know there is a possibility he will need oxygen at home.   Plan: EPW removed yesterday. Remains 3kg over baseline weight. Continue lasix for fluid overload. Will switch from IV to PO. Likely home tomorrow. Will do a 6 minute walk test today to evaluate for home oxygen.     LOS: 7 days    Sharlene Dory 05/09/2018  Home tomorrow on po lasix DC instructions reviewed w/ patient- wound care, diet,activity limits and meds patient examined and medical record reviewed,agree with above note. Kathlee Nations Trigt III 05/09/2018

## 2018-05-09 NOTE — Progress Notes (Signed)
CARDIAC REHAB PHASE I   PRE:  Rate/Rhythm: 88 SR    BP: sitting 131/83    SaO2: 87-91 RA in bed  MODE:  Ambulation: 470 ft   POST:  Rate/Rhythm: 111 ST    BP:  to BR quickly     SaO2: 88 RA (86 RA in hall, up with rest and PLB)  Pt moving well, steady pace. SAO2 low at times, 86-90 RA in hall and in bed, up with rest and/or pursed lip breathing. Pt rushed back to BR for BM. Encouraged more walking and IS/flutter. He sts he slept off O2 but also no pulse ox on. Will f/u for education. [id number]   Harriet Masson CES, ACSM 05/09/2018 9:41 AM

## 2018-05-09 NOTE — Progress Notes (Signed)
Progress Note  Patient Name: Jimmy Collins Date of Encounter: 05/09/2018  Primary Cardiologist: No primary care provider on file. [name]  Subjective   CP with coughing.  Inpatient Medications    Scheduled Meds: . aspirin EC  325 mg Oral Daily   Or  . aspirin  324 mg Per Tube Daily  . bisacodyl  10 mg Oral Daily   Or  . bisacodyl  10 mg Rectal Daily  . docusate sodium  200 mg Oral Daily  . furosemide  40 mg Oral Daily  . guaiFENesin  1,200 mg Oral BID  . insulin aspart  0-24 Units Subcutaneous TID WC  . insulin detemir  15 Units Subcutaneous Daily  . levalbuterol  1.25 mg Nebulization BID  . mouth rinse  15 mL Mouth Rinse BID  . metoprolol tartrate  25 mg Oral BID   Or  . metoprolol tartrate  12.5 mg Per Tube BID  . mometasone-formoterol  2 puff Inhalation BID  . pantoprazole  40 mg Oral Daily  . potassium chloride  20 mEq Oral BID  . rosuvastatin  20 mg Oral QHS  . sodium chloride flush  3 mL Intravenous Q12H   Continuous Infusions:  PRN Meds: acetaminophen, levalbuterol, metoprolol tartrate, ondansetron (ZOFRAN) IV, oxyCODONE, sodium chloride flush, traMADol   Vital Signs    Vitals:   05/09/18 0416 05/09/18 0724 05/09/18 0737 05/09/18 1120  BP: 121/73  111/74 119/71  Pulse: 84 86 96 78  Resp: 14 20 18  (!) 24  Temp: 98.9 F (37.2 C)  97.8 F (36.6 C) 99.1 F (37.3 C)  TempSrc: Oral  Oral Oral  SpO2: 91% 93% 90% 91%  Weight: 95.3 kg     Height:        Intake/Output Summary (Last 24 hours) at 05/09/2018 1208 Last data filed at 05/09/2018 1100 Gross per 24 hour  Intake 720 ml  Output 1500 ml  Net -780 ml   Last 3 Weights 05/09/2018 05/08/2018 05/08/2018  Weight (lbs) 210 lb 3.2 oz 214 lb 1.1 oz 214 lb 1.1 oz  Weight (kg) 95.346 kg 97.1 kg 97.1 kg      Telemetry    NSR - Personally Reviewed  ECG      Physical Exam   GEN: No acute distress.   Neck: No JVD Cardiac: RRR, no murmurs, rubs, or gallops.  Respiratory: Clear to auscultation  bilaterally. GI: Soft, nontender, non-distended  MS: No edema; No deformity. Neuro:  Nonfocal  Psych: Normal affect   Labs    Chemistry Recent Labs  Lab 05/02/18 1912  05/05/18 0510  05/06/18 0501 05/07/18 0249 05/08/18 0225  NA 136   < > 139   < > 136 138 137  K 3.5   < > 4.1   < > 3.7 3.9 4.5  CL 107   < > 106  --  100 100 104  CO2 21*   < > 22  --  28 30 28   GLUCOSE 257*   < > 119*  --  133* 116* 111*  BUN 13   < > 19  --  15 15 17   CREATININE 1.00   < > 1.17  --  0.87 0.82 0.83  CALCIUM 9.2   < > 7.7*  --  8.3* 8.7* 8.5*  PROT 6.7  --  5.7*  --   --   --   --   ALBUMIN 3.5  --  3.3*  --   --   --   --  AST 22  --  50*  --   --   --   --   ALT 28  --  28  --   --   --   --   ALKPHOS 50  --  37*  --   --   --   --   BILITOT 0.4  --  0.9  --   --   --   --   GFRNONAA >60   < > >60  --  >60 >60 >60  GFRAA >60   < > >60  --  >60 >60 >60  ANIONGAP 8   < > 11  --  8 8 5    < > = values in this interval not displayed.     Hematology Recent Labs  Lab 05/06/18 0501 05/07/18 0249 05/08/18 0225  WBC 17.0* 15.1* 14.1*  RBC 3.82* 3.52* 3.44*  HGB 11.0* 10.0* 10.0*  HCT 33.9* 30.8* 30.4*  MCV 88.7 87.5 88.4  MCH 28.8 28.4 29.1  MCHC 32.4 32.5 32.9  RDW 14.0 13.9 14.2  PLT 148* 164 210    Cardiac EnzymesNo results for input(s): TROPONINI in the last 168 hours. No results for input(s): TROPIPOC in the last 168 hours.   BNPNo results for input(s): BNP, PROBNP in the last 168 hours.   DDimer No results for input(s): DDIMER in the last 168 hours.   Radiology    Dg Chest 2 View  Result Date: 05/08/2018 CLINICAL DATA:  CABG 5 days ago. EXAM: CHEST - 2 VIEW COMPARISON:  Two-view chest x-ray 05/07/2018 FINDINGS: The heart is enlarged. Small bilateral effusions continue to improve. Left lower lobe airspace opacity is not significantly changed. Lung volumes remain low. Epicardial pacing wires remain. IMPRESSION: 1. Slight decrease in small bilateral pleural effusions. 2.  Residual left lower lobe airspace disease, likely atelectasis. Electronically Signed   By: Marin Roberts M.D.   On: 05/08/2018 11:27    Cardiac Studies     Patient Profile     58 y.o. male CAD. S/p CABG  Assessment & Plan    1) CAD: COntinue aggressive secondary prevention.  On rosuvastatin for hyperlipidemia  Dietary counseling for prediabetes.  [facility] will sign off.   Medication Recommendations:  Continue statin Other recommendations (labs, testing, etc):  RF modification for prediabetes Follow up as an outpatient:  Will arrange  For questions or updates, please contact [facility] Please consult [email or web address] for contact info under        Signed, Lance Muss, MD  05/09/2018, 12:08 PM

## 2018-05-10 LAB — GLUCOSE, CAPILLARY: Glucose-Capillary: 119 mg/dL — ABNORMAL HIGH (ref 70–99)

## 2018-05-10 MED ORDER — OXYCODONE HCL 5 MG PO TABS: 5.0000 mg | ORAL_TABLET | ORAL | 0 refills | Status: DC | PRN

## 2018-05-10 MED ORDER — FUROSEMIDE 40 MG PO TABS: 40.0000 mg | ORAL_TABLET | Freq: Every day | ORAL | 0 refills | Status: DC

## 2018-05-10 MED ORDER — GUAIFENESIN ER 600 MG PO TB12: 1200.0000 mg | ORAL_TABLET | Freq: Two times a day (BID) | ORAL | Status: DC | PRN

## 2018-05-10 MED ORDER — ASPIRIN 325 MG PO TBEC: 325.0000 mg | DELAYED_RELEASE_TABLET | Freq: Every day | ORAL | 0 refills | Status: DC

## 2018-05-10 MED ORDER — POTASSIUM CHLORIDE CRYS ER 20 MEQ PO TBCR: 20.0000 meq | EXTENDED_RELEASE_TABLET | Freq: Every day | ORAL | 0 refills | Status: DC

## 2018-05-10 MED ORDER — ACETAMINOPHEN 325 MG PO TABS: 650.0000 mg | ORAL_TABLET | ORAL | Status: DC | PRN

## 2018-05-10 NOTE — Care Management Note (Signed)
Case Management Note Donn Pierini RN, BSN Transitions of Care Unit 4E- RN Case Manager [phone]  Patient Details  Name: Jimmy Collins MRN: [id number] Date of Birth: Oct 06, 1960  Subjective/Objective:   Pt admitted with [place], s/p CABG x2                 Action/Plan: PTA pt lived at home with spouse, plan to return home. No CM needs noted for transition home.   Expected Discharge Date:  05/10/18               Expected Discharge Plan:  Home/Self Care  In-House Referral:  NA  Discharge planning Services  CM Consult  Post Acute Care Choice:  NA Choice offered to:  NA  DME Arranged:    DME Agency:     HH Arranged:    HH Agency:     Status of Service:  Completed, signed off  If discussed at Long Length of Stay Meetings, dates discussed:    Discharge Disposition: home/self care   Additional Comments:  Darrold Span, RN 05/10/2018, 10:19 AM

## 2018-05-10 NOTE — Progress Notes (Signed)
Jimmy Collins to be D/C'd Home per MD order. Discussed with the patient and all questions fully answered.    IV catheter discontinued intact. Site without signs and symptoms of complications. Dressing and pressure applied.  An After Visit Summary was printed and given to the patient.  Patient to be escorted via WC, and D/C home via private auto.  [name]  05/10/2018 11:23 AM

## 2018-05-10 NOTE — Progress Notes (Signed)
CARDIAC REHAB PHASE I   Ed completed with pt and wife. Good reception. He is motivated to quit smoking and his wife is working on it. Will refer to [place] CRPII. Discussed decreasing [facility] to avoid DM. Discussed full diet ed. [phone]  Harriet Masson CES, ACSM 05/10/2018 10:43 AM

## 2018-05-10 NOTE — Progress Notes (Addendum)
      [address]       [place] [address]             [phone]      7 Days Post-Op Procedure(s) (LRB): CORONARY ARTERY BYPASS GRAFTING (CABG), ON PUMP, TIMES TWO, USING LEFT INTERNAL MAMMARY ARTERY AND ENDOSCOPICALLY HARVESTED LEFT GREATER SAPHENOUS VEIN (N/A) TRANSESOPHAGEAL ECHOCARDIOGRAM (TEE) (N/A)   Subjective:  No new complaints.  Feels good and wants to go home.  + ambulation  + BM  Objective: Vital signs in last 24 hours: Temp:  [97.9 F (36.6 C)-99.1 F (37.3 C)] 98.7 F (37.1 C) (02/28 0458) Pulse Rate:  [67-94] 77 (02/28 0458) Cardiac Rhythm: Sinus bradycardia (02/28 0137) Resp:  [13-28] 13 (02/28 0458) BP: (112-127)/(65-81) 112/81 (02/28 0458) SpO2:  [90 %-94 %] 90 % (02/28 0458) Weight:  [92.9 kg] 92.9 kg (02/28 0458)  Intake/Output from previous day: 02/27 0701 - 02/28 0700 In: 1143 [P.O.:1140; I.V.:3] Out: 3050 [Urine:3050]  General appearance: alert, cooperative and no distress Heart: regular rate and rhythm Lungs: clear to auscultation bilaterally Abdomen: soft, non-tender; bowel sounds normal; no masses,  no organomegaly Extremities: edema trace Wound: clean and dry, ecchymosis LLE  Lab Results: Recent Labs    05/08/18 0225  WBC 14.1*  HGB 10.0*  HCT 30.4*  PLT 210   BMET:  Recent Labs    05/08/18 0225  NA 137  K 4.5  CL 104  CO2 28  GLUCOSE 111*  BUN 17  CREATININE 0.83  CALCIUM 8.5*    PT/INR: No results for input(s): LABPROT, INR in the last 72 hours. ABG    Component Value Date/Time   PHART 7.350 05/05/2018 0526   HCO3 26.1 05/05/2018 0526   TCO2 28 05/05/2018 0526   ACIDBASEDEF 1.0 05/03/2018 2245   O2SAT 94.0 05/05/2018 0526   CBG (last 3)  Recent Labs    05/09/18 1602 05/09/18 2157 05/10/18 0639  GLUCAP 109* 106* 119*    Assessment/Plan: S/P Procedure(s) (LRB): CORONARY ARTERY BYPASS GRAFTING (CABG), ON PUMP, TIMES TWO, USING LEFT INTERNAL MAMMARY ARTERY AND ENDOSCOPICALLY HARVESTED LEFT  GREATER SAPHENOUS VEIN (N/A) TRANSESOPHAGEAL ECHOCARDIOGRAM (TEE) (N/A)  1. CV- NSR, did bradydown with short pause, patient asymptomatic- will continue Lopressor 2. Pulm- no acute issues, off oxygen, continue IS 3. Renal- creatinine WNL, weight is below baseline, will taper lasix over next several days, 4. Dispo- patient stable, will d/c home today   LOS: 8 days    Lowella Dandy 05/10/2018  patient examined and medical record reviewed,agree with above note. Kathlee Nations Trigt III 05/10/2018

## 2018-05-16 ENCOUNTER — Ambulatory Visit (INDEPENDENT_AMBULATORY_CARE_PROVIDER_SITE_OTHER): Payer: Managed Care, Other (non HMO) | Admitting: Family Medicine

## 2018-05-16 ENCOUNTER — Telehealth (HOSPITAL_COMMUNITY): Payer: Self-pay

## 2018-05-16 ENCOUNTER — Encounter: Payer: Self-pay | Admitting: Family Medicine

## 2018-05-16 VITALS — BP 100/54 | HR 86 | Temp 97.8°F | Resp 16 | Ht 65.0 in | Wt 200.0 lb

## 2018-05-16 DIAGNOSIS — Z09 Encounter for follow-up examination after completed treatment for conditions other than malignant neoplasm: Secondary | ICD-10-CM

## 2018-05-16 NOTE — Telephone Encounter (Signed)
Pt insurance is active and benefits verified through [employer] $30.00, DED $2,000/0 met, out of pocket $4,000/0 met, co-insurance 0%. no pre-authorization required. Passport, 05/16/2018 @12 :15pm, REF# [id number]  Will contact patient to see if he is interested in the Cardiac Rehab Program. If interested, patient will need to complete follow up appt. Once completed, patient will be contacted for scheduling upon review by the RN Navigator. Tedra [place]. Support Rep II

## 2018-05-16 NOTE — Progress Notes (Signed)
Subjective:    Patient ID: Jimmy Collins, male    DOB: May 13, 1960, 58 y.o.   MRN: [id number]  HPI  04/19/18 Patient states that over the last several weeks, at least 3 weeks, he has developed chest pain with activity.  He states if he walks up several flights of steps he will develop a pressure-like pain in the center of his chest that will radiate down his left arm.  He will also develop shortness of breath.  If he stops and rests the pain goes away quickly.  He denies any chest pain or chest pressure at rest but he does report worsening dyspnea on exertion.  EKG today in clinic shows normal sinus rhythm with a right bundle branch block and nonspecific ST segment changes in the lateral leads.  There is no overt sign of ischemia or infarction.  Patient denies any symptoms of unstable angina.  He is chest pain-free at the present time.  He is compliant with Crestor 20 mg a day.  However he is not taking anything for his blood pressure.  He is not taking aspirin.  He continues to smoke.  At that time, my plan was: Patient has new onset stable angina.  Therefore I want to arrange cardiology consultation as soon as possible for most likely catheterization and evaluation.  Meanwhile I want the patient start aspirin 81 mg a day.  Continue Crestor 20 mg a day.  Add Toprol-XL 25 mg a day due to his elevated blood pressure as well as for cardioprotective effects.  Strongly, strongly, strongly encourage smoking cessation.  Gave the patient a prescription for nitroglycerin.  Gave him instructions on how to take the nitroglycerin and when to take the nitroglycerin.  He is to call 911 immediately if he develops chest pain at rest.  He is to call 911 if he develops exertional chest pain that does not resolve with rest and/or nitroglycerin.  Otherwise will arrange urgent outpatient evaluation by cardiology.  Strongly advised the patient to avoid any strenuous activity until evaluated further.  05/16/18 Was admitted to the  hospital and had CABG: Admit date: 05/02/2018 Discharge date: 05/10/2018  Admission Diagnoses:     Patient Active Problem List   Diagnosis Date Noted  . CAD (coronary artery disease) 05/02/2018  . Essential hypertension 04/30/2018  . Family history of heart disease 04/30/2018  . Unstable angina (HCC) 04/30/2018  . Dyslipidemia   . Smoker   . Mammalian Meat Allergy   . GERD (gastroesophageal reflux disease)   . GERD 04/28/2008  . NEPHROLITHIASIS, HX OF 04/28/2008    Discharge Diagnoses:  Active Problems:   Unstable angina (HCC)   CAD (coronary artery disease)   S/P CABG x 2   Discharged Condition: good  HPI:   58 year old obese nondiabetic smoker presents with symptoms of unstable angina, mainly exertional. He was evaluated by [name] who recommended cardiac catheterization which was performed today. This shows a 95% distal left main stenosis. There is a moderate proximal 70 to 80% LAD stenosis. The RCA is dominant and without significant disease. LV ejection fraction is normal. LVEDP is 14. The patient is currently stable in the Cath Lab holding area without chest pain in sinus rhythm and with normal blood pressure. Surgical coronary revascularization is recommended. Chest x-ray, pre-CABG Dopplers, and blood work is pending.  Hospital Course:   On 05/03/2018 Jimmy Collins underwent an urgent coronary artery bypass grafting x2 by [name].  He tolerated procedure well was transferred to  the surgical ICU for continued care.  He was extubated timely manner.  Postop day 1 he remained hemodynamically stable on low-dose norepinephrine and dopamine.  His renal function remained stable.  He had minimal chest tube output.  Later that day he was extubated and was awake and alert.  He does have a longstanding history of smoking.  Postop day 2 we discontinued his arterial line and his chest tubes.  We continue diuresis for mild volume overload.  He remains on Levemir and  rarely needs sliding scale insulin.  He was not a diabetic preoperatively and his hemoglobin A1c was 6.0.  He will need lots of education on diet modification but hopefully will not need insulin for home.  He remained in normal sinus rhythm and no longer was needing any drips at this time.  He remained on 3 L nasal cannula with good oxygen saturation.  He continued to utilize his incentive spirometer and a flutter valve was added.  We initiated Mucinex for assistance with secretions.  He was then stable to transfer to the telemetry floor for continued care.  On the floor, he was weaned down to 1 L nasal cannula.  His chest x-ray remained stable.  He did remain in normal sinus rhythm therefore we discontinued his epicardial pacing wires.  We continue to encourage pulmonary toilet for weaning of oxygen and respiratory status.  Today, he is tolerating room air, his incisions are healing well, he is ambulating with limited assistance, and he is ready for discharge home.  05/16/18 Patient is doing remarkably well.  Surgical wound on his chest is healing nicely.  There is no evidence of cellulitis or induration or erythema.  There is some bruising on the medial surface of his right lower leg and left lower leg due to vein harvesting however there is no evidence of DVT.  There is no pitting edema in his legs.  There is no evidence of secondary cellulitis.  He is still taking Lasix and potassium.  His weight is stable.  He reports polyuria.  His lungs are completely clear to auscultation bilaterally.  There is no evidence of pulmonary edema or fluid overload on exam.  I question if he still needs the diuretic.  He denies any orthopnea or paroxysmal nocturnal dyspnea.  He is using Percocet occasionally for pain primarily due to the surgery.  He denies any pleurisy or evidence of pulmonary embolism.  He remains free from smoking.  I congratulated the patient on this.  His wife is working on smoking cessation as well.  He was  found to be a prediabetic with a hemoglobin A1c of 6.0.  We discussed low carbohydrate diet today.   Past Medical History:  Diagnosis Date  . 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  Alpha-gal allergy  . Dyslipidemia   . GERD (gastroesophageal reflux disease)   . Kidney stones   . [facility] spotted fever   . Smoker    Past Surgical History:  Procedure Laterality Date  . CORONARY ARTERY BYPASS GRAFT N/A 05/03/2018   Procedure: CORONARY ARTERY BYPASS GRAFTING (CABG), ON PUMP, TIMES TWO, USING LEFT INTERNAL MAMMARY ARTERY AND ENDOSCOPICALLY HARVESTED LEFT GREATER SAPHENOUS VEIN;  Surgeon: Kerin Perna, MD;  Location: [facility] OR;  Service: Open Heart Surgery;  Laterality: N/A;  . KIDNEY STONE SURGERY     bilateral  . KNEE SURGERY     left  . LEFT HEART CATH AND CORONARY ANGIOGRAPHY  N/A 05/02/2018   Procedure: LEFT HEART CATH AND CORONARY ANGIOGRAPHY;  Surgeon: Runell Gess, MD;  Location: [facility] INVASIVE CV LAB;  Service: Cardiovascular;  Laterality: N/A;  . TEE WITHOUT CARDIOVERSION N/A 05/03/2018   Procedure: TRANSESOPHAGEAL ECHOCARDIOGRAM (TEE);  Surgeon: Donata Clay, Theron Arista, MD;  Location: [facility] OR;  Service: Open Heart Surgery;  Laterality: N/A;   Current Outpatient Medications on File Prior to Visit  Medication Sig Dispense Refill  . acetaminophen (TYLENOL) 325 MG tablet Take 2 tablets (650 mg total) by mouth every 4 (four) hours as needed for headache or mild pain.    Marland Kitchen aspirin EC 325 MG EC tablet Take 1 tablet (325 mg total) by mouth daily. 30 tablet 0  . diphenhydrAMINE (BENADRYL) 50 MG tablet Take 1 tablet (50 mg total) by mouth once for 1 dose. Patient will need a prescription for Prednisone and very clear instructions (as follows): Prednisone 50 mg - take 13 hours prior to test Take another Prednisone 50 mg 7 hours prior to test Take another Prednisone 50 mg 1 hour prior to test Take Benadryl 50 mg 1 hour prior to test Patient must complete  all four doses of above prophylactic medications. Patient will need a ride after test due to Benadryl. (Patient taking differently: Take 50 mg by mouth once. Take Benadryl 50 mg 1 hour prior to test) 1 tablet 0  . EPINEPHrine (EPIPEN 2-PAK) 0.3 mg/0.3 mL IJ SOAJ injection Inject 0.3 mLs (0.3 mg total) into the muscle once as needed (for severe allergic reaction). 1 Device 1  . furosemide (LASIX) 40 MG tablet Take 1 tablet (40 mg total) by mouth daily. 7 tablet 0  . guaiFENesin (MUCINEX) 600 MG 12 hr tablet Take 2 tablets (1,200 mg total) by mouth 2 (two) times daily as needed.    . lansoprazole (PREVACID) 30 MG capsule Take 30 mg by mouth daily.      . metoprolol succinate (TOPROL-XL) 25 MG 24 hr tablet Take 1 tablet (25 mg total) by mouth daily. (Patient taking differently: Take 25 mg by mouth daily with lunch. ) 90 tablet 3  . nitroGLYCERIN (NITROSTAT) 0.4 MG SL tablet Place 1 tablet (0.4 mg total) under the tongue every 5 (five) minutes as needed for chest pain. 50 tablet 3  . oxyCODONE (OXY IR/ROXICODONE) 5 MG immediate release tablet Take 1-2 tablets (5-10 mg total) by mouth every 4 (four) hours as needed for severe pain. 30 tablet 0  . potassium chloride SA (K-DUR,KLOR-CON) 20 MEQ tablet Take 1 tablet (20 mEq total) by mouth daily. 7 tablet 0  . rosuvastatin (CRESTOR) 20 MG tablet Take 1 tablet (20 mg total) by mouth daily. (Patient taking differently: Take 20 mg by mouth at bedtime. ) 90 tablet 0   No current facility-administered medications on file prior to visit.    Allergies  Allergen Reactions  . Other Hives, Itching and Swelling    Mammalian Meat Allergy  . Heparin   . Shellfish Allergy Hives and Rash   Social History   Socioeconomic History  . Marital status: Married    Spouse name: Not on file  . Number of children: Not on file  . Years of education: Not on file  . Highest education level: Not on file  Occupational History  . Not on file  Social Needs  . Financial  resource strain: Not on file  . Food insecurity:    Worry: Not on file    Inability: Not on file  . Transportation needs:  Medical: Not on file    Non-medical: Not on file  Tobacco Use  . Smoking status: Current Every Day Smoker    Packs/day: 1.00  . Smokeless tobacco: Never Used  Substance and Sexual Activity  . Alcohol use: No  . Drug use: Never  . Sexual activity: Not on file  Lifestyle  . Physical activity:    Days per week: Not on file    Minutes per session: Not on file  . Stress: Not on file  Relationships  . Social connections:    Talks on phone: Not on file    Gets together: Not on file    Attends religious service: Not on file    Active member of club or organization: Not on file    Attends meetings of clubs or organizations: Not on file    Relationship status: Not on file  . Intimate partner violence:    Fear of current or ex partner: Not on file    Emotionally abused: Not on file    Physically abused: Not on file    Forced sexual activity: Not on file  Other Topics Concern  . Not on file  Social History Narrative  . Not on file      Review of Systems  All other systems reviewed and are negative.      Objective:   Physical Exam  Constitutional: He is oriented to person, place, and time. He appears well-developed and well-nourished. No distress.  HENT:  Head: Normocephalic and atraumatic.  Right Ear: External ear normal.  Left Ear: External ear normal.  Nose: Nose normal.  Mouth/Throat: Oropharynx is clear and moist. No oropharyngeal exudate.  Eyes: Pupils are equal, round, and reactive to light. Conjunctivae and EOM are normal. Right eye exhibits no discharge. Left eye exhibits no discharge. No scleral icterus.  Neck: Neck supple. No JVD present. No tracheal deviation present. No thyromegaly present.  Cardiovascular: Normal rate, regular rhythm, normal heart sounds and intact distal pulses. Exam reveals no gallop and no friction rub.  No murmur  heard. Pulmonary/Chest: Effort normal and breath sounds normal. No stridor. No respiratory distress. He has no wheezes. He has no rales.  Abdominal: Soft. Bowel sounds are normal. He exhibits no distension and no mass. There is no abdominal tenderness. There is no rebound and no guarding.  Musculoskeletal:        General: No edema.  Lymphadenopathy:    He has no cervical adenopathy.  Neurological: He is alert and oriented to person, place, and time. He has normal reflexes. No cranial nerve deficit. He exhibits normal muscle tone. Coordination normal.  Skin: No rash noted. He is not diaphoretic.  Psychiatric: He has a normal mood and affect. His behavior is normal. Judgment and thought content normal.  Vitals reviewed.         Assessment & Plan:  Hospital discharge follow-up - Plan: CBC with Differential/Platelet, COMPLETE METABOLIC PANEL WITH GFR  Spent 25 minutes today with the patient reviewing his medication list and hospital records.  Encouraged him to stay on aspirin metoprolol and Crestor for secondary prevention of cardiovascular disease.  Strongly encouraged him to continue to remain free from smoking.  I do believe the patient can discontinue Lasix and potassium today.  Encouraged the patient to monitor his weight on a daily basis and if he sees significant weight gain or swelling we may need to resume his Lasix.  We discussed low carbohydrate diet.  Check CBC and CMP today to monitor his  perioperative leukocytosis.  Recheck fasting lab work in 3 months

## 2018-05-17 ENCOUNTER — Other Ambulatory Visit: Payer: Self-pay | Admitting: Family Medicine

## 2018-05-17 DIAGNOSIS — D72828 Other elevated white blood cell count: Secondary | ICD-10-CM

## 2018-05-17 LAB — CBC WITH DIFFERENTIAL/PLATELET
Absolute Monocytes: 1172 cells/uL — ABNORMAL HIGH (ref 200–950)
Basophils Absolute: 182 cells/uL (ref 0–200)
Basophils Relative: 1.1 %
Eosinophils Absolute: 759 cells/uL — ABNORMAL HIGH (ref 15–500)
Eosinophils Relative: 4.6 %
HCT: 40.4 % (ref 38.5–50.0)
Hemoglobin: 13.6 g/dL (ref 13.2–17.1)
Lymphs Abs: 4521 cells/uL — ABNORMAL HIGH (ref 850–3900)
MCH: 28.9 pg (ref 27.0–33.0)
MCHC: 33.7 g/dL (ref 32.0–36.0)
MCV: 86 fL (ref 80.0–100.0)
MPV: 9.9 fL (ref 7.5–12.5)
Monocytes Relative: 7.1 %
Neutro Abs: 9867 cells/uL — ABNORMAL HIGH (ref 1500–7800)
Neutrophils Relative %: 59.8 %
Platelets: 626 10*3/uL — ABNORMAL HIGH (ref 140–400)
RBC: 4.7 10*6/uL (ref 4.20–5.80)
RDW: 13.3 % (ref 11.0–15.0)
Total Lymphocyte: 27.4 %
WBC: 16.5 10*3/uL — ABNORMAL HIGH (ref 3.8–10.8)

## 2018-05-17 LAB — COMPLETE METABOLIC PANEL WITH GFR
AG Ratio: 1.3 (calc) (ref 1.0–2.5)
ALT: 34 U/L (ref 9–46)
AST: 22 U/L (ref 10–35)
Albumin: 4 g/dL (ref 3.6–5.1)
Alkaline phosphatase (APISO): 74 U/L (ref 35–144)
BUN: 17 mg/dL (ref 7–25)
CO2: 26 mmol/L (ref 20–32)
Calcium: 9.8 mg/dL (ref 8.6–10.3)
Chloride: 98 mmol/L (ref 98–110)
Creat: 0.86 mg/dL (ref 0.70–1.33)
GFR, Est African American: 112 mL/min/{1.73_m2} (ref >=60)
GFR, Est Non African American: 96 mL/min/{1.73_m2} (ref >=60)
Globulin: 3.2 g/dL (calc) (ref 1.9–3.7)
Glucose, Bld: 98 mg/dL (ref 65–99)
Potassium: 5.1 mmol/L (ref 3.5–5.3)
Sodium: 135 mmol/L (ref 135–146)
Total Bilirubin: 0.4 mg/dL (ref 0.2–1.2)
Total Protein: 7.2 g/dL (ref 6.1–8.1)

## 2018-05-22 ENCOUNTER — Telehealth: Payer: Self-pay | Admitting: Family Medicine

## 2018-05-22 NOTE — Telephone Encounter (Signed)
pts wife called and states that he is not sleeping well and wanted to know what he can take to help him sleep? She takes Zquil and it helps her sleep but she did not want to give him anything without your ok.

## 2018-05-23 ENCOUNTER — Other Ambulatory Visit: Payer: Self-pay | Admitting: Family Medicine

## 2018-05-23 NOTE — Telephone Encounter (Signed)
I sent ambien.

## 2018-05-24 ENCOUNTER — Other Ambulatory Visit: Payer: Self-pay | Admitting: Family Medicine

## 2018-05-24 MED ORDER — ZOLPIDEM TARTRATE 10 MG PO TABS: 10.0000 mg | ORAL_TABLET | Freq: Every evening | ORAL | 0 refills | Status: DC | PRN

## 2018-05-24 MED ORDER — OXYCODONE HCL 5 MG PO TABS: 5.0000 mg | ORAL_TABLET | ORAL | 0 refills | Status: DC | PRN

## 2018-05-24 NOTE — Addendum Note (Signed)
Addended by: Legrand Rams B on: 05/24/2018 02:07 PM   Modules accepted: Orders

## 2018-05-24 NOTE — Telephone Encounter (Signed)
Wife aware

## 2018-05-24 NOTE — Telephone Encounter (Signed)
Pt needs [place] to call in [place] and oxycodone to [facility] [address].

## 2018-05-24 NOTE — Telephone Encounter (Signed)
I set up the Oxycodone but I do not see Ambien at all - (this was probably sent with the downtime) Plesae send.

## 2018-05-28 ENCOUNTER — Other Ambulatory Visit: Payer: Self-pay

## 2018-05-28 ENCOUNTER — Telehealth: Payer: Self-pay

## 2018-05-28 ENCOUNTER — Ambulatory Visit (INDEPENDENT_AMBULATORY_CARE_PROVIDER_SITE_OTHER): Payer: Managed Care, Other (non HMO) | Admitting: Cardiovascular Disease

## 2018-05-28 ENCOUNTER — Encounter: Payer: Self-pay | Admitting: Cardiovascular Disease

## 2018-05-28 DIAGNOSIS — F17200 Nicotine dependence, unspecified, uncomplicated: Secondary | ICD-10-CM

## 2018-05-28 DIAGNOSIS — I10 Essential (primary) hypertension: Secondary | ICD-10-CM

## 2018-05-28 DIAGNOSIS — Z951 Presence of aortocoronary bypass graft: Secondary | ICD-10-CM | POA: Diagnosis not present

## 2018-05-28 DIAGNOSIS — E785 Hyperlipidemia, unspecified: Secondary | ICD-10-CM | POA: Diagnosis not present

## 2018-05-28 NOTE — Patient Instructions (Signed)
Medication Instructions:  Your physician recommends that you continue on your current medications as directed. Please refer to the Current Medication list given to you today.  If you need a refill on your cardiac medications before your next appointment, please call your pharmacy.   Lab work: Your physician recommends that you return for lab work in: 2 months (lipid and liver panels)  If you have labs (blood work) drawn today and your tests are completely normal, you will receive your results only by: Marland Kitchen MyChart Message (if you have MyChart) OR . A paper copy in the mail If you have any lab test that is abnormal or we need to change your treatment, we will call you to review the results.  Testing/Procedures: NONE  Follow-Up: At [facility], you and your health needs are our priority.  As part of our continuing mission to provide you with exceptional heart care, we have created designated Provider Care Teams.  These Care Teams include your primary Cardiologist (physician) and Advanced Practice Providers (APPs -  Physician Assistants and Nurse Practitioners) who all work together to provide you with the care you need, when you need it. You will need a follow up appointment in 3 months WITH [name].   Any Other Special Instructions Will Be Listed Below (If Applicable). REFERRAL TO CARDIAC REHABILITATION    Heart-Healthy Eating Plan Heart-healthy meal planning includes:  Eating less unhealthy fats.  Eating more healthy fats.  Making other changes in your diet. Talk with your doctor or a diet specialist (dietitian) to create an eating plan that is right for you.  What are tips for following this plan? Cooking Avoid frying your food. Try to bake, boil, grill, or broil it instead. You can also reduce fat by:  Removing the skin from poultry.  Removing all visible fats from meats.  Steaming vegetables in water or broth. Meal planning   At meals, divide your plate into four  equal parts: ? Fill one-half of your plate with vegetables and green salads. ? Fill one-fourth of your plate with whole grains. ? Fill one-fourth of your plate with lean protein foods.  Eat 4-5 servings of vegetables per day. A serving of vegetables is: ? 1 cup of raw or cooked vegetables. ? 2 cups of raw leafy greens.  Eat 4-5 servings of fruit per day. A serving of fruit is: ? 1 medium whole fruit. ?  cup of dried fruit. ?  cup of fresh, frozen, or canned fruit. ?  cup of 100% fruit juice.  Eat more foods that have soluble fiber. These are apples, broccoli, carrots, beans, peas, and barley. Try to get 20-30 g of fiber per day.  Eat 4-5 servings of nuts, legumes, and seeds per week: ? 1 serving of dried beans or legumes equals  cup after being cooked. ? 1 serving of nuts is  cup. ? 1 serving of seeds equals 1 tablespoon. General information  Eat more home-cooked food. Eat less restaurant, buffet, and fast food.  Limit or avoid alcohol.  Limit foods that are high in starch and sugar.  Avoid fried foods.  Lose weight if you are overweight.  Keep track of how much salt (sodium) you eat. This is important if you have high blood pressure. Ask your doctor to tell you more about this.  Try to add vegetarian meals each week. Fats  Choose healthy fats. These include olive oil and canola oil, flaxseeds, walnuts, almonds, and seeds.  Eat more omega-3 fats. These  include salmon, mackerel, sardines, tuna, flaxseed oil, and ground flaxseeds. Try to eat fish at least 2 times each week.  Check food labels. Avoid foods with trans fats or high amounts of saturated fat.  Limit saturated fats. ? These are often found in animal products, such as meats, butter, and cream. ? These are also found in plant foods, such as palm oil, palm kernel oil, and coconut oil.  Avoid foods with partially hydrogenated oils in them. These have trans fats. Examples are stick margarine, some tub  margarines, cookies, crackers, and other baked goods. What foods can I eat? Fruits All fresh, canned (in natural juice), or frozen fruits. Vegetables Fresh or frozen vegetables (raw, steamed, roasted, or grilled). Green salads. Grains Most grains. Choose whole wheat and whole grains most of the time. Rice and pasta, including brown rice and pastas made with whole wheat. Meats and other proteins Lean, well-trimmed beef, veal, pork, and lamb. Chicken and [place] without skin. All fish and shellfish. Wild duck, rabbit, pheasant, and venison. Egg whites or low-cholesterol egg substitutes. Dried beans, peas, lentils, and tofu. Seeds and most nuts. Dairy Low-fat or nonfat cheeses, including ricotta and mozzarella. Skim or 1% milk that is liquid, powdered, or evaporated. Buttermilk that is made with low-fat milk. Nonfat or low-fat yogurt. Fats and oils Non-hydrogenated (trans-free) margarines. Vegetable oils, including soybean, sesame, sunflower, olive, peanut, safflower, corn, canola, and cottonseed. Salad dressings or mayonnaise made with a vegetable oil. Beverages Mineral water. Coffee and tea. Diet carbonated beverages. Sweets and desserts Sherbet, gelatin, and fruit ice. Small amounts of dark chocolate. Limit all sweets and desserts. Seasonings and condiments All seasonings and condiments. The items listed above may not be a complete list of foods and drinks you can eat. Contact a dietitian for more options. What foods should I avoid? Fruits Canned fruit in heavy syrup. Fruit in cream or butter sauce. Fried fruit. Limit coconut. Vegetables Vegetables cooked in cheese, cream, or butter sauce. Fried vegetables. Grains Breads that are made with saturated or trans fats, oils, or whole milk. Croissants. Sweet rolls. Donuts. High-fat crackers, such as cheese crackers. Meats and other proteins Fatty meats, such as hot dogs, ribs, sausage, bacon, rib-eye roast or steak. High-fat deli meats, such  as salami and bologna. Caviar. Domestic duck and goose. Organ meats, such as liver. Dairy Cream, sour cream, cream cheese, and creamed cottage cheese. Whole-milk cheeses. Whole or 2% milk that is liquid, evaporated, or condensed. Whole buttermilk. Cream sauce or high-fat cheese sauce. Yogurt that is made from whole milk. Fats and oils Meat fat, or shortening. Cocoa butter, hydrogenated oils, palm oil, coconut oil, palm kernel oil. Solid fats and shortenings, including bacon fat, salt pork, lard, and butter. Nondairy cream substitutes. Salad dressings with cheese or sour cream. Beverages Regular sodas and juice drinks with added sugar. Sweets and desserts Frosting. Pudding. Cookies. Cakes. Pies. Milk chocolate or white chocolate. Buttered syrups. Full-fat ice cream or ice cream drinks. The items listed above may not be a complete list of foods and drinks to avoid. Contact a dietitian for more information. Summary  Heart-healthy meal planning includes eating less unhealthy fats, eating more healthy fats, and making other changes in your diet.  Eat a balanced diet. This includes fruits and vegetables, low-fat or nonfat dairy, lean protein, nuts and legumes, whole grains, and heart-healthy oils and fats. This information is not intended to replace advice given to you by your health care provider. Make sure you discuss any questions you have  with your health care provider. Document Released: 08/29/2011 Document Revised: 04/06/2017 Document Reviewed: 04/06/2017 Elsevier Interactive Patient Education  2019 [employer].

## 2018-05-28 NOTE — Assessment & Plan Note (Signed)
History of recently discontinued tobacco abuse 

## 2018-05-28 NOTE — Progress Notes (Signed)
05/28/2018 Jimmy Collins   July 06, 1960  [id number]  Primary Physician Tanya Nones Priscille Heidelberg, MD Primary Cardiologist: Runell Gess MD Nicholes Calamity, [place]  HPI:  Jimmy Collins is a 58 y.o.  moderately overweight married Caucasian male father of 1 child referred by Dr. Tanya Nones for cardiovascular dilation because of new onset effort angina.  He works doing maintenance.    I last saw him in the office 04/30/2018.  His risk factors include 80-pack-year history tobacco abuse having cut back from 2 packs a day to 1/2 pack a day 2 weeks ago at the request of his PCP.  His risk factors otherwise include treated hypertension, hyperlipidemia and family history with a father who had a myocardial infarction and a brother who is had stents.  His brother is my patient and I placed the stents.  He also has a hiatal hernia with GERD.  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  He has had chest pain for 4 weeks which is typically exertional with pain down both arms and shortness of breath.  I performed cardiac catheterization on him 05/02/2018 revealing high-grade distal left main disease and segmental proximal LAD disease with right to left collaterals and normal LV function.  The following day he underwent CABG x2 by [name] with a LIMA to his LAD and a vein to an obtuse marginal branch.  Was discharged home 1 week later.  He feels clinically improved.   Current Meds  Medication Sig  . acetaminophen (TYLENOL) 325 MG tablet Take 2 tablets (650 mg total) by mouth every 4 (four) hours as needed for headache or mild pain.  Marland Kitchen aspirin EC 325 MG EC tablet Take 1 tablet (325 mg total) by mouth daily.  Marland Kitchen EPINEPHrine (EPIPEN 2-PAK) 0.3 mg/0.3 mL IJ SOAJ injection Inject 0.3 mLs (0.3 mg total) into the muscle once as needed (for severe allergic reaction).  . lansoprazole (PREVACID) 30 MG capsule Take 30 mg by mouth daily.    . metoprolol succinate (TOPROL-XL) 25  MG 24 hr tablet Take 1 tablet (25 mg total) by mouth daily. (Patient taking differently: Take 25 mg by mouth daily with lunch. )  . nitroGLYCERIN (NITROSTAT) 0.4 MG SL tablet Place 1 tablet (0.4 mg total) under the tongue every 5 (five) minutes as needed for chest pain.  Marland Kitchen oxyCODONE (OXY IR/ROXICODONE) 5 MG immediate release tablet Take 1-2 tablets (5-10 mg total) by mouth every 4 (four) hours as needed for severe pain.  . rosuvastatin (CRESTOR) 20 MG tablet Take 1 tablet (20 mg total) by mouth daily.  Marland Kitchen zolpidem (AMBIEN) 10 MG tablet Take 1 tablet (10 mg total) by mouth at bedtime as needed for up to 30 days for sleep.  . [DISCONTINUED] furosemide (LASIX) 40 MG tablet Take 1 tablet (40 mg total) by mouth daily.  . [DISCONTINUED] potassium chloride SA (K-DUR,KLOR-CON) 20 MEQ tablet Take 1 tablet (20 mEq total) by mouth daily.     Allergies  Allergen Reactions  . Other Hives, Itching and Swelling    Mammalian Meat Allergy  . Heparin   . Shellfish Allergy Hives and Rash    Social History   Socioeconomic History  . Marital status: Married    Spouse name: Not on file  . Number of children: Not on file  . Years of education: Not on file  . Highest education level: Not on file  Occupational History  . Not on file  Social Needs  . Financial resource strain: Not on file  . Food insecurity:    Worry: Not on file    Inability: Not on file  . Transportation needs:    Medical: Not on file    Non-medical: Not on file  Tobacco Use  . Smoking status: Former Smoker    Packs/day: 1.00  . Smokeless tobacco: Never Used  Substance and Sexual Activity  . Alcohol use: No  . Drug use: Never  . Sexual activity: Not on file  Lifestyle  . Physical activity:    Days per week: Not on file    Minutes per session: Not on file  . Stress: Not on file  Relationships  . Social connections:    Talks on phone: Not on file    Gets together: Not on file    Attends religious service: Not on file     Active member of club or organization: Not on file    Attends meetings of clubs or organizations: Not on file    Relationship status: Not on file  . Intimate partner violence:    Fear of current or ex partner: Not on file    Emotionally abused: Not on file    Physically abused: Not on file    Forced sexual activity: Not on file  Other Topics Concern  . Not on file  Social History Narrative  . Not on file     Review of Systems: General: negative for chills, fever, night sweats or weight changes.  Cardiovascular: negative for chest pain, dyspnea on exertion, edema, orthopnea, palpitations, paroxysmal nocturnal dyspnea or shortness of breath Dermatological: negative for rash Respiratory: negative for cough or wheezing Urologic: negative for hematuria Abdominal: negative for nausea, vomiting, diarrhea, bright red blood per rectum, melena, or hematemesis Neurologic: negative for visual changes, syncope, or dizziness All other systems reviewed and are otherwise negative except as noted above.    Blood pressure 121/76, pulse 77, height 5\' 5"  (1.651 m), weight 202 lb (91.6 kg).  General appearance: alert and no distress Neck: no adenopathy, no carotid bruit, no JVD, supple, symmetrical, trachea midline and thyroid not enlarged, symmetric, no tenderness/mass/nodules Lungs: clear to auscultation bilaterally Heart: regular rate and rhythm, S1, S2 normal, no murmur, click, rub or gallop Extremities: extremities normal, atraumatic, no cyanosis or edema Pulses: 2+ and symmetric Skin: Skin color, texture, turgor normal. No rashes or lesions Neurologic: Alert and oriented X 3, normal strength and tone. Normal symmetric reflexes. Normal coordination and gait  EKG not performed today  ASSESSMENT AND PLAN:   Dyslipidemia History of dyslipidemia currently on Crestor with LDL performed 01/28/2018 of 144.  He has changed his diet since his bypass operation.  We will recheck lipid liver profile in  2 months.  Smoker History of recently discontinued tobacco abuse  Essential hypertension History of essential hypertension with blood pressure measured today at 121/76.  He is on metoprolol.  S/P CABG x 2 History of CAD status post cardiac catheterization performed by myself 05/02/2018 revealing 95% calcified distal left main disease as well as proximal LAD disease with right to left collaterals and normal LV function.  The following day he underwent CABG x2 by [name] with a LIMA to the LAD and a vein to the obtuse marginal branch.  He was discharged home 1 week later and had an uncomplicated hospitalization.      Runell Gess MD FACP,FACC,FAHA, [facility] 05/28/2018 3:15 PM

## 2018-05-28 NOTE — Addendum Note (Signed)
Addended by: Harlow Asa on: 05/28/2018 03:40 PM   Modules accepted: Orders

## 2018-05-28 NOTE — Assessment & Plan Note (Signed)
History of dyslipidemia currently on Crestor with LDL performed 01/28/2018 of 144.  He has changed his diet since his bypass operation.  We will recheck lipid liver profile in 2 months.

## 2018-05-28 NOTE — Assessment & Plan Note (Signed)
History of essential hypertension with blood pressure measured today at 121/76.  He is on metoprolol.

## 2018-05-28 NOTE — Assessment & Plan Note (Signed)
History of CAD status post cardiac catheterization performed by myself 05/02/2018 revealing 95% calcified distal left main disease as well as proximal LAD disease with right to left collaterals and normal LV function.  The following day he underwent CABG x2 by [name] with a LIMA to the LAD and a vein to the obtuse marginal branch.  He was discharged home 1 week later and had an uncomplicated hospitalization.

## 2018-05-30 ENCOUNTER — Telehealth: Payer: Self-pay | Admitting: Physician Assistant

## 2018-05-30 NOTE — Telephone Encounter (Signed)
      [address]       [place] [address]             [phone]    Jimmy Collins  [id number]      Today, I had the pleasure of speaking with Becky Augusta  weeks status post CABG x 2.  Overall, the patient is doing well.  He states he has been improving over recent weeks.  He is ambulating daily.  He denies chest pain, shortness of breath, palpitations.  His incisions have healed without evidence of infection.  He denies any lower extremity swelling. He is using pain medication at night only.    All questions were answered to the patient's satisfaction. The patient is aware that they may call our office if new concerns arise. Our phone number was provided to the patient for convenience.    Lowella Dandy, PA-C

## 2018-05-31 ENCOUNTER — Other Ambulatory Visit: Payer: Managed Care, Other (non HMO)

## 2018-05-31 NOTE — Telephone Encounter (Signed)
Called and spoke with pt wife to see if pt is interested in participating in the CR program.. pt wife was not sure told pt's wife to tell pt to call back.. pt's wife stated okay. Tempie Donning. Support Rep II

## 2018-06-04 ENCOUNTER — Ambulatory Visit: Payer: Managed Care, Other (non HMO)

## 2018-06-06 NOTE — Telephone Encounter (Signed)
Encounter closed

## 2018-06-10 ENCOUNTER — Telehealth: Payer: Self-pay | Admitting: Family Medicine

## 2018-06-10 LAB — ECHO INTRAOPERATIVE TEE
Height: 65 in
Weight: 3456 oz

## 2018-06-10 NOTE — Telephone Encounter (Signed)
Pt is done with his oxy and wife wanted to know if he needed to continue that or if he can just take tylenol and if so how much can he take in a day?

## 2018-06-10 NOTE — Telephone Encounter (Signed)
Tylenol 500 q 6 hrs prn

## 2018-06-11 NOTE — Telephone Encounter (Signed)
Pt's wife aware of recommendations 

## 2018-06-19 ENCOUNTER — Telehealth (HOSPITAL_COMMUNITY): Payer: Self-pay | Admitting: Surgical

## 2018-06-19 ENCOUNTER — Telehealth: Payer: Self-pay | Admitting: Physician Assistant

## 2018-06-19 NOTE — Telephone Encounter (Signed)
      [address]       [place] [address]             [phone]    DILRAJ HERIN  [id number]      Today, I had the pleasure of speaking with Mr. Jimmy Collins. He had an urgent CABG x 2 on 05/06/2018 by Dr. Donata Clay. Denny Peon Barrett PA-C had contacted him on 03/19 to see how he was recovering from surgery.   Overall, the patient is doing (very well/poor).   1. Incisions-drainage? Erythema? Swelling? Patient states wounds are healing well and there is no drainage from chest or LE wounds.  2. Fever? Patient denies fever 3. Any symptoms to note? Patient states he has occasional sternal pain with activity. He denies shortness of breath,or dizziness 4. He ambulates several times per day. He had to fix a stopper under the sink yesterday and he was a bit sore after that 5. He is to continue with sternal precautions (no lifting more than 10 pounds for 2-3 more weeks). He has already been driving. 6. He does not not need any refill on his medications at this time.    I told the patient and his wife that his appointment for next Tuesday April 14th has been rescheduled due to COVID-19. His appointment will be on 05/13 at 1 pm and our office will call him with this appointment.  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 He has already seen [name] once and will return to see him on June 17th. Patient was instructed to call our office for any questions or concerns in the interim.    Doree Fudge PA-C

## 2018-06-25 ENCOUNTER — Ambulatory Visit: Payer: Managed Care, Other (non HMO)

## 2018-06-25 ENCOUNTER — Telehealth (HOSPITAL_COMMUNITY): Payer: Self-pay | Admitting: *Deleted

## 2018-06-25 NOTE — Telephone Encounter (Signed)
Called and spoke to wife regarding continued closure for cardiac rehab due to national recommendations for covid-19.  Pt wife verbalized understanding.  Pt follow up app with [facility] has been extended to 07/24/18.  Pt wife is hopeful that her husband will be able to return back to work and unsure if he can participate in CR. Did ask for check in with them once se are permitted to schedule patients.  Pt is walking outside and inside the home for his exercise. Alanson Aly, BSN Cardiac and Emergency planning/management officer

## 2018-07-01 NOTE — Telephone Encounter (Signed)
No response

## 2018-07-17 ENCOUNTER — Other Ambulatory Visit: Payer: Self-pay | Admitting: Family Medicine

## 2018-07-23 ENCOUNTER — Other Ambulatory Visit: Payer: Self-pay

## 2018-07-23 ENCOUNTER — Other Ambulatory Visit: Payer: Self-pay | Admitting: Cardiothoracic Surgery

## 2018-07-23 DIAGNOSIS — Z951 Presence of aortocoronary bypass graft: Secondary | ICD-10-CM

## 2018-07-24 ENCOUNTER — Ambulatory Visit
Admission: RE | Admit: 2018-07-24 | Discharge: 2018-07-24 | Disposition: A | Discharge status: Home / Self Care | Payer: Managed Care, Other (non HMO) | Source: Ambulatory Visit | Attending: Cardiothoracic Surgery | Admitting: Cardiothoracic Surgery

## 2018-07-24 ENCOUNTER — Ambulatory Visit (INDEPENDENT_AMBULATORY_CARE_PROVIDER_SITE_OTHER): Payer: Self-pay | Admitting: Cardiothoracic Surgery

## 2018-07-24 ENCOUNTER — Encounter: Payer: Self-pay | Admitting: Cardiothoracic Surgery

## 2018-07-24 VITALS — BP 130/70 | HR 76 | Temp 97.9°F | Resp 16 | Ht 65.0 in | Wt 214.8 lb

## 2018-07-24 DIAGNOSIS — Z951 Presence of aortocoronary bypass graft: Secondary | ICD-10-CM

## 2018-07-24 DIAGNOSIS — I25119 Atherosclerotic heart disease of native coronary artery with unspecified angina pectoris: Secondary | ICD-10-CM

## 2018-07-24 NOTE — Progress Notes (Signed)
PCP is Donita BrooksPickard, Warren T, MD Referring Provider is Runell GessBerry, Jonathan J, MD  Chief Complaint  Patient presents with  . Routine Post Op    f/u s/p CABG X 2 on 05/03/18 with a CXR...has had telephone visits with the P.A.    HPI: Patient returns for postop follow-up after emergency CABG x2 for left main stenosis almost 3 months ago.  Patient has had 2 previous virtual visits.  Overall he has had a good recovery.  He still has some soreness.  He denies angina.  The surgical incisions are healing well.  He has had no significant problems with fluid retention, edema, or shortness of breath.  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  He was provided with a return to work approval with limitations on his lifting for the first 8 weeks. Chest x-ray performed today shows clear lung fields, sternal wires intact, no pleural effusions.  Patient is compliant with his metoprolol, aspirin, and Crestor.  He takes Tylenol as needed for discomfort.   Past Medical History:  Diagnosis Date  . 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  Alpha-gal allergy  . Dyslipidemia   . GERD (gastroesophageal reflux disease)   . Kidney stones   . [facility]   . Smoker     Past Surgical History:  Procedure Laterality Date  . CORONARY ARTERY BYPASS GRAFT N/A 05/03/2018   Procedure: CORONARY ARTERY BYPASS GRAFTING (CABG), ON PUMP, TIMES TWO, USING LEFT INTERNAL MAMMARY ARTERY AND ENDOSCOPICALLY HARVESTED LEFT GREATER SAPHENOUS VEIN;  Surgeon: Kerin PernaVan Trigt, Darron Stuck, MD;  [facility];  Service: Open Heart Surgery;  Laterality: N/A;  . KIDNEY STONE SURGERY     bilateral  . KNEE SURGERY     left  . LEFT HEART CATH AND CORONARY ANGIOGRAPHY N/A 05/02/2018   Procedure: LEFT HEART CATH AND CORONARY ANGIOGRAPHY;  Surgeon: Runell GessBerry, Jonathan J, MD;  Location: [facility] INV[facility];  Service: Cardiovascular;  Laterality:  N/A;  . TEE WITHOUT CARDIOVERSION N/A 05/03/2018   Procedure: TRANSESOPHAGEAL ECHOCARDIOGRAM (TEE);  Surgeon: Donata ClayVan Trigt, Theron AristaPeter, MD;  [facility];  Service: Open Heart Surgery;  Laterality: N/A;    History reviewed. No pertinent family history.  Social History Social History   Tobacco Use  . Smoking status: Former Smoker    Packs/day: 1.00  . Smokeless tobacco: Never Used  Substance Use Topics  . Alcohol use: No  . Drug use: Never    Current Outpatient Medications  Medication Sig Dispense Refill  . acetaminophen (TYLENOL) 325 MG tablet Take 2 tablets (650 mg total) by mouth every 4 (four) hours as needed for headache or mild pain.    Marland Kitchen. aspirin EC 325 MG EC tablet Take 1 tablet (325 mg total) by mouth daily. 30 tablet 0  . EPINEPHrine (EPIPEN 2-PAK) 0.3 mg/0.3 mL IJ SOAJ injection Inject 0.3 mLs (0.3 mg total) into the muscle once as needed (for severe allergic reaction). 1 Device 1  . lansoprazole (PREVACID) 30 MG capsule Take 30 mg by mouth daily.      . metoprolol succinate (TOPROL-XL) 25 MG 24 hr tablet Take 1 tablet (25 mg total) by mouth daily. (Patient taking differently: Take 25 mg by mouth daily with lunch. ) 90 tablet 3  . nitroGLYCERIN (NITROSTAT) 0.4 MG SL tablet Place 1 tablet (0.4 mg total) under the tongue every 5 (five) minutes  as needed for chest pain. 50 tablet 3  . rosuvastatin (CRESTOR) 20 MG tablet TAKE 1 TABLET(20 MG) BY MOUTH DAILY 90 tablet 0   No current facility-administered medications for this visit.     Allergies  Allergen Reactions  . Other Hives, Itching and Swelling    Mammalian Meat Allergy  . Heparin   . Shellfish Allergy Hives and Rash    Review of Systems  Appetite improved, weight stable No fever No cough or myalgias No edema  BP 130/70 (BP Location: Right Arm, Patient Position: Sitting, Cuff Size: Large)   Pulse 76   Temp 97.9 F (36.6 C) (Skin)   Resp 16   Ht 5\' 5"  (1.651 m)   Wt 214 lb 12.8 oz (97.4 kg)   SpO2 95% Comment:  RA  BMI 35.74 kg/m  Physical Exam      Exam    General- alert and comfortable    Neck- no JVD, no cervical adenopathy palpable, no carotid bruit   Lungs- clear without rales, wheezes.  Sternal incision well-healed.   Cor- regular rate and rhythm, no murmur , gallop   Abdomen- soft, non-tender   Extremities - warm, non-tender, minimal edema   Neuro- oriented, appropriate, no focal weakness   Diagnostic Tests: Chest x-ray image personally reviewed and is clear. Impression: Excellent early recovery after urgent CABG x2 for unstable and angina and left main stenosis  Plan: Patient will be able to return to work on May 18 and printed authorization slip provided.  He should not lift more than 50 pounds until June.  I will see him back in 1 month for final evaluation to assess his progress. Importance of of heart healthy diet andheart healthy daily activity reviewed with patient.  [name], MD [facility] [phone]

## 2018-08-20 ENCOUNTER — Telehealth: Payer: Self-pay | Admitting: Family Medicine

## 2018-08-20 NOTE — Telephone Encounter (Signed)
Pt's wife called and states that the pt is not able to get any sleep. He has went back to work and the [place] is not helping at all. She looked up online about valium and would like to know if this would be something that would work for him?

## 2018-08-20 NOTE — Telephone Encounter (Signed)
He could try replacing ambien with valium 5 mg poqhs prn insomnia (30)

## 2018-08-22 ENCOUNTER — Telehealth (HOSPITAL_COMMUNITY): Payer: Self-pay | Admitting: *Deleted

## 2018-08-22 MED ORDER — DIAZEPAM 5 MG PO TABS: 5.0000 mg | ORAL_TABLET | Freq: Every day | ORAL | 0 refills | Status: DC

## 2018-08-22 NOTE — Telephone Encounter (Signed)
Pt aware -- please send rx.

## 2018-08-28 ENCOUNTER — Ambulatory Visit: Payer: Managed Care, Other (non HMO) | Admitting: Cardiovascular Disease

## 2018-09-10 ENCOUNTER — Other Ambulatory Visit: Payer: Self-pay

## 2018-09-11 ENCOUNTER — Encounter: Payer: Self-pay | Admitting: Cardiothoracic Surgery

## 2018-09-11 ENCOUNTER — Ambulatory Visit (INDEPENDENT_AMBULATORY_CARE_PROVIDER_SITE_OTHER): Payer: Managed Care, Other (non HMO) | Admitting: Cardiothoracic Surgery

## 2018-09-11 VITALS — BP 126/85 | HR 82 | Temp 97.7°F | Resp 15 | Ht 65.0 in | Wt 220.0 lb

## 2018-09-11 DIAGNOSIS — Z951 Presence of aortocoronary bypass graft: Secondary | ICD-10-CM

## 2018-09-11 DIAGNOSIS — I25119 Atherosclerotic heart disease of native coronary artery with unspecified angina pectoris: Secondary | ICD-10-CM

## 2018-09-11 NOTE — Progress Notes (Signed)
PCP is Susy Frizzle, MD Referring Provider is Lorretta Harp, MD  Chief Complaint  Patient presents with  . Routine Post Op    s/p CABG X 2...05/03/18..final visit to assess progress    HPI: Final postop visit after emergency CABG x2 about 4 months ago. Patient is doing well without recurrent angina but still has some soreness in his chest shoulders and some slight orthostatic dizziness.  Chest x-ray today is clear His incisions are healed.  His complaints include insomnia, weight gain after he stopped smoking, and sore right shoulder and chest which makes it difficult to perform his job duties.  At this point he still needs assistance with heavy lifting, going up tall ladders, and lifting equipment over his head.  In sinus rhythm.  He knows importance of a heart healthy lifestyle including diet and 30 minutes of walking 5 days out of 7.  He knows he should continue his aspirin, Crestor, and metoprolol indefinitely.  Past Medical History:  Diagnosis Date  . 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  Alpha-gal allergy  . Dyslipidemia   . GERD (gastroesophageal reflux disease)   . Kidney stones   . [facility] spotted fever   . Smoker     Past Surgical History:  Procedure Laterality Date  . CORONARY ARTERY BYPASS GRAFT N/A 05/03/2018   Procedure: CORONARY ARTERY BYPASS GRAFTING (CABG), ON PUMP, TIMES TWO, USING LEFT INTERNAL MAMMARY ARTERY AND ENDOSCOPICALLY HARVESTED LEFT GREATER SAPHENOUS VEIN;  Surgeon: Ivin Poot, MD;  Location: [place];  Service: Open Heart Surgery;  Laterality: N/A;  . KIDNEY STONE SURGERY     bilateral  . KNEE SURGERY     left  . LEFT HEART CATH AND CORONARY ANGIOGRAPHY N/A 05/02/2018   Procedure: LEFT HEART CATH AND CORONARY ANGIOGRAPHY;  Surgeon: Lorretta Harp, MD;  Location: [place] CV LAB;  Service: Cardiovascular;  Laterality: N/A;  . TEE WITHOUT CARDIOVERSION N/A 05/03/2018   Procedure:  TRANSESOPHAGEAL ECHOCARDIOGRAM (TEE);  Surgeon: Prescott Gum, Collier Salina, MD;  Location: [place];  Service: Open Heart Surgery;  Laterality: N/A;    No family history on file.  Social History Social History   Tobacco Use  . Smoking status: Former Smoker    Packs/day: 1.00  . Smokeless tobacco: Never Used  Substance Use Topics  . Alcohol use: No  . Drug use: Never    Current Outpatient Medications  Medication Sig Dispense Refill  . acetaminophen (TYLENOL) 325 MG tablet Take 2 tablets (650 mg total) by mouth every 4 (four) hours as needed for headache or mild pain.    Marland Kitchen aspirin EC 325 MG EC tablet Take 1 tablet (325 mg total) by mouth daily. 30 tablet 0  . EPINEPHrine (EPIPEN 2-PAK) 0.3 mg/0.3 mL IJ SOAJ injection Inject 0.3 mLs (0.3 mg total) into the muscle once as needed (for severe allergic reaction). 1 Device 1  . lansoprazole (PREVACID) 30 MG capsule Take 30 mg by mouth daily.      . metoprolol succinate (TOPROL-XL) 25 MG 24 hr tablet Take 1 tablet (25 mg total) by mouth daily. (Patient taking differently: Take 25 mg by mouth daily with lunch. ) 90 tablet 3  . nitroGLYCERIN (NITROSTAT) 0.4 MG SL tablet Place 1 tablet (0.4 mg total) under the tongue every 5 (five) minutes as needed for chest pain. 50 tablet 3  . rosuvastatin (CRESTOR) 20 MG tablet TAKE 1 TABLET(20 MG) BY MOUTH DAILY 90  tablet 0   No current facility-administered medications for this visit.     Allergies  Allergen Reactions  . Other Hives, Itching and Swelling    Mammalian Meat Allergy  . Heparin   . Shellfish Allergy Hives and Rash    Review of Systems  Gained weight since stop smoking No palpitations No edema No sternal pop or clicks  BP 126/85 (BP Location: Right Arm, Patient Position: Sitting, Cuff Size: Normal)   Pulse 82   Temp 97.7 F (36.5 C) Comment: THERMAL  Resp 15   Ht 5\' 5"  (1.651 m)   Wt 220 lb (99.8 kg)   SpO2 94% Comment: ON RA  BMI 36.61 kg/m  Physical Exam      Exam    General-  alert and comfortable    Neck- no JVD, no cervical adenopathy palpable, no carotid bruit   Lungs- clear without rales, wheezes   Cor- regular rate and rhythm, no murmur , gallop   Abdomen- soft, non-tender   Extremities - warm, non-tender, minimal edema   Neuro- oriented, appropriate, no focal weakness   Diagnostic Tests: Chest x-ray image personally reviewed, clear with no pulmonary edema, no pleural effusion, sternal wires intact  Impression: Doing well almost 4 months after surgery.  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  Since he is not able to complete the outpatient phase 2 cardiac rehab program due to the COVID pandemic limitations he should try to walk 30 minutes daily to build up his endurance and strength.  Plan: He was given a note that he could return to full job duties after mid August. Return as needed.  Mikey BussingPeter Van Trigt III, MD [facility] [phone]

## 2018-09-20 ENCOUNTER — Telehealth: Payer: Self-pay

## 2018-09-20 NOTE — Telephone Encounter (Signed)
Spoke with pt wife who states pt wants to keep appt as virtual and will be ok with appt changed to 7/21 at 0900. She states pt does not have a smart phone. Reviewed virtual process with pt wife. She verbalized understanding. Appt changed to 7/21 at 0900.

## 2018-09-21 LAB — LIPID PANEL
Chol/HDL Ratio: 3.4 ratio (ref 0.0–5.0)
Cholesterol, Total: 119 mg/dL (ref 100–199)
HDL: 35 mg/dL — ABNORMAL LOW (ref >39)
LDL Calculated: 44 mg/dL (ref 0–99)
Triglycerides: 202 mg/dL — ABNORMAL HIGH (ref 0–149)
VLDL Cholesterol Cal: 40 mg/dL (ref 5–40)

## 2018-09-21 LAB — HEPATIC FUNCTION PANEL
ALT: 25 IU/L (ref 0–44)
AST: 20 IU/L (ref 0–40)
Albumin: 4.5 g/dL (ref 3.8–4.9)
Alkaline Phosphatase: 71 IU/L (ref 39–117)
Bilirubin Total: <0.2 mg/dL (ref 0.0–1.2)
Bilirubin, Direct: 0.07 mg/dL (ref 0.00–0.40)
Total Protein: 7 g/dL (ref 6.0–8.5)

## 2018-09-24 ENCOUNTER — Telehealth: Payer: Managed Care, Other (non HMO) | Admitting: Cardiovascular Disease

## 2018-10-01 ENCOUNTER — Telehealth (INDEPENDENT_AMBULATORY_CARE_PROVIDER_SITE_OTHER): Payer: Managed Care, Other (non HMO) | Admitting: Cardiovascular Disease

## 2018-10-01 ENCOUNTER — Telehealth: Payer: Self-pay

## 2018-10-01 DIAGNOSIS — I10 Essential (primary) hypertension: Secondary | ICD-10-CM | POA: Diagnosis not present

## 2018-10-01 DIAGNOSIS — Z951 Presence of aortocoronary bypass graft: Secondary | ICD-10-CM

## 2018-10-01 DIAGNOSIS — Z8249 Family history of ischemic heart disease and other diseases of the circulatory system: Secondary | ICD-10-CM

## 2018-10-01 DIAGNOSIS — E785 Hyperlipidemia, unspecified: Secondary | ICD-10-CM

## 2018-10-01 DIAGNOSIS — I2510 Atherosclerotic heart disease of native coronary artery without angina pectoris: Secondary | ICD-10-CM

## 2018-10-01 NOTE — Telephone Encounter (Signed)
Patient and/or DPR-approved person aware of 7/21 AVS instructions and verbalized understanding.  Letter including After Visit Summary and any other necessary documents to be mailed to the patient's address on file. 

## 2018-10-01 NOTE — Progress Notes (Signed)
Virtual Visit via Telephone Note   This visit type was conducted due to national recommendations for restrictions regarding the COVID-19 Pandemic (e.g. social distancing) in an effort to limit this patient's exposure and mitigate transmission in our community.  Due to his co-morbid illnesses, this patient is at least at moderate risk for complications without adequate follow up.  This format is felt to be most appropriate for this patient at this time.  The patient did not have access to video technology/had technical difficulties with video requiring transitioning to audio format only (telephone).  All issues noted in this document were discussed and addressed.  No physical exam could be performed with this format.  Please refer to the patient's chart for his  consent to telehealth for [facility].   Date:  10/01/2018   ID:  Jimmy Collins, DOB [birth year], MRN [id number]  Patient Location: Home Provider Location: Home  PCP:  Donita BrooksPickard, Warren T, MD  Cardiologist: Dr. Nanetta BattyJonathan Antonyo Hinderer Electrophysiologist:  None   Evaluation Performed:  Follow-Up Visit  Chief Complaint: Follow-up CAD, hypertension and hyperlipidemia  History of Present Illness:    Jimmy AugustaBobby R Collins is a 58y.o.  moderately overweight married Caucasian male father of 1 child referred by Dr. Tanya NonesPickard for cardiovascular dilation because of new onset effort angina. He works doing maintenance.   I last saw him in the office 05/28/2018.  His risk factors include 80-pack-year history tobacco abuse having cut back from 2 packs a day to 1/2 pack a day 2 weeks ago at the request of his PCP. His risk factors otherwise include treated hypertension, hyperlipidemia and family history with a father who had a myocardial infarction and a brother who is had stents. His brother is my patient and I placed the stents. He also has a hiatal hernia with GERD. 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 He  has had chest pain for 4 weeks which is typically exertional with pain down both arms and shortness of breath.  I performed cardiac catheterization on him 05/02/2018 revealing high-grade distal left main disease and segmental proximal LAD disease with right to left collaterals and normal LV function.  The following day he underwent CABG x2 by Dr. Maren BeachVantrigt with a LIMA to his LAD and a vein to an obtuse marginal branch.  Was discharged home 1 week later.  He feels clinically improved.  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  He does get out and walk daily.  He denies chest pain or shortness of breath.  He did mention that he stopped smoking on the day of his surgery.  He had an excellent lipid profile performed 09/20/2018.  The patient does not have symptoms concerning for COVID-19 infection (fever, chills, cough, or new shortness of breath).    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  Alpha-gal allergy   Dyslipidemia    GERD (gastroesophageal reflux disease)    Kidney stones    [facility]    Smoker    Past Surgical History:  Procedure Laterality Date   CORONARY ARTERY BYPASS GRAFT N/A 05/03/2018   Procedure: CORONARY ARTERY BYPASS GRAFTING (CABG), ON PUMP, TIMES TWO, USING LEFT INTERNAL MAMMARY ARTERY AND ENDOSCOPICALLY HARVESTED LEFT GREATER SAPHENOUS VEIN;  Surgeon: Kerin PernaVan Trigt, Peter, MD;  Location: [facility] OR;  Service: Open Heart Surgery;  Laterality: N/A;   KIDNEY STONE SURGERY     bilateral   KNEE SURGERY     left   LEFT HEART CATH AND CORONARY ANGIOGRAPHY N/A 05/02/2018   Procedure: LEFT HEART CATH AND CORONARY ANGIOGRAPHY;  Surgeon: Runell GessBerry, Obrien Huskins J, MD;  Location: [facility] INVASIVE CV LAB;  Service: Cardiovascular;  Laterality: N/A;   TEE WITHOUT CARDIOVERSION N/A 05/03/2018   Procedure: TRANSESOPHAGEAL ECHOCARDIOGRAM (TEE);  Surgeon: Donata ClayVan  Trigt, Theron AristaPeter, MD;  [facility];  Service: Open Heart Surgery;  Laterality: N/A;     No outpatient medications have been marked as taking for the 10/01/18 encounter (Appointment) with Runell GessBerry, Krimson Massmann J, MD.     Allergies:   Other, Heparin, and Shellfish allergy   Social History   Tobacco Use   Smoking status: Former Smoker    Packs/day: 1.00   Smokeless tobacco: Never Used  Substance Use Topics   Alcohol use: No   Drug use: Never     Family Hx: The patient's family history is not on file.  ROS:   Please see the history of present illness.     All other systems reviewed and are negative.   Prior CV studies:   The following studies were reviewed today:  None  Labs/Other Tests and Data Reviewed:    EKG:  No ECG reviewed.  Recent Labs: 05/03/2018: TSH 0.554 05/04/2018: Magnesium 2.2 05/16/2018: BUN 17; Creat 0.86; Hemoglobin 13.6; Platelets 626; Potassium 5.1; Sodium 135 09/20/2018: ALT 25   Recent Lipid Panel Lab Results  Component Value Date/Time   CHOL 119 09/20/2018 08:16 AM   TRIG 202 (H) 09/20/2018 08:16 AM   HDL 35 (L) 09/20/2018 08:16 AM   CHOLHDL 3.4 09/20/2018 08:16 AM   CHOLHDL 6.6 (H) 01/28/2018 08:53 AM   LDLCALC 44 09/20/2018 08:16 AM   LDLCALC 144 (H) 01/28/2018 08:53 AM    Wt Readings from Last 3 Encounters:  09/11/18 220 lb (99.8 kg)  07/24/18 214 lb 12.8 oz (97.4 kg)  05/28/18 202 lb (91.6 kg)     Objective:    Vital Signs:  There were no vitals taken for this visit.   VITAL SIGNS:  reviewed a complete physical exam was not performed today since this was a telephone virtual telemedicine visit.  ASSESSMENT & PLAN:    1. Coronary artery disease- history of CAD status post cath performed 05/02/2018 revealing left main/three-vessel disease with bypass surgery performed the following day by Dr. Maren BeachVantrigt with a LIMA to the LAD and a vein to an obtuse marginal branch.  He was discharged from the hospital a week later and has slowly  recuperated since. 2. Hyperlipidemia-history of hyperlipidemia on Crestor with lipid profile performed 09/20/2018 revealing total 0.19, LDL 44 and HDL of 35 3. Essential hypertension- history of essential hypertension on metoprolol.  He was not able to check his blood pressure today 4. Tobacco abuse- long history of tobacco abuse having quit on the day of his bypass graft operation.  COVID-19 Education: The signs and symptoms of COVID-19 were discussed with the patient and how to seek care for testing (follow up with PCP or arrange E-visit).  The importance of social distancing was discussed today.  Time:   Today, I have spent 5 minutes with the patient with telehealth technology discussing the above problems.     Medication Adjustments/Labs and Tests Ordered: Current medicines are reviewed at length with the patient today.  Concerns regarding medicines are outlined above.   Tests Ordered:  No orders of the defined types were placed in this encounter.   Medication Changes: No orders of the defined types were placed in this encounter.   Follow Up:  In Person in 1 year(s)  Signed, Quay Burow, MD  10/01/2018 7:34 AM    [place]

## 2018-10-01 NOTE — Patient Instructions (Signed)

## 2018-10-15 ENCOUNTER — Other Ambulatory Visit: Payer: Self-pay | Admitting: Family Medicine

## 2018-12-09 ENCOUNTER — Telehealth: Payer: Self-pay | Admitting: Cardiovascular Disease

## 2018-12-09 NOTE — Telephone Encounter (Signed)
Patient wife called back- advised that she had made the appointment on the 13th. Advised to call us if husband worsened, and we could try sooner appointment, patient wife verbalized understanding.

## 2018-12-09 NOTE — Telephone Encounter (Signed)
LM for wife/patient to return call to schedule APP visit per MD

## 2018-12-09 NOTE — Telephone Encounter (Signed)
Returned call to patient's wife (DPR) who reports SOB since May with minimal exertion - such as walking. He ahd CABG in Feb 2020. Patient did not report SOB to MD at July telemedicine. He does reports some left-sided chest pain when straining to lift something (musculoskeltal pain). Weights have been stable since last visit around 218lbs. He denies swelling.   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 He had to quit working because of this.   Advised will notify MD to see if he needs any testing or f/u of this.

## 2018-12-09 NOTE — Telephone Encounter (Signed)
New Message ° ° °Patient returning your call. °

## 2018-12-09 NOTE — Telephone Encounter (Signed)
° °  Pt c/o Shortness Of Breath: STAT if SOB developed within the last 24 hours or pt is noticeably SOB on the phone  1. Are you currently SOB (can you hear that pt is SOB on the phone)? Patient is still in bed  2. How long have you been experiencing SOB? Since his surgery in May  3. Are you SOB when sitting or when up moving around? Up and moving around  4. Are you currently experiencing any other symptoms? fatigue  Patient had a double bypass surgery back in May. 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  He is unable to do daily tasks without being work out. Wife is  Worried about SOB and Fatigue

## 2018-12-09 NOTE — Telephone Encounter (Signed)
Have him come in to see an APP for eval

## 2018-12-20 ENCOUNTER — Telehealth: Payer: Self-pay | Admitting: Cardiovascular Disease

## 2018-12-20 NOTE — Telephone Encounter (Signed)
Returned call to wife she states that this is an appointment to fill out disability paperwork not just a follow up appointment. She states that pt will forget "something very important to fill out his paperwork" ok to come to appt

## 2018-12-20 NOTE — Telephone Encounter (Signed)
New Message  Patient's wife is calling in to get approval to accompany patient to his appointment on 12/24/18 at 10:15 with Jory Sims NP due to patient having issues understanding and remembering things. Please give patient a call back to confirm.

## 2018-12-24 ENCOUNTER — Ambulatory Visit (INDEPENDENT_AMBULATORY_CARE_PROVIDER_SITE_OTHER): Payer: Self-pay | Admitting: Adult Health

## 2018-12-24 ENCOUNTER — Other Ambulatory Visit: Payer: Self-pay

## 2018-12-24 ENCOUNTER — Encounter: Payer: Self-pay | Admitting: Adult Health

## 2018-12-24 VITALS — BP 128/82 | HR 79 | Ht 65.0 in | Wt 229.6 lb

## 2018-12-24 DIAGNOSIS — Z79899 Other long term (current) drug therapy: Secondary | ICD-10-CM

## 2018-12-24 DIAGNOSIS — Z951 Presence of aortocoronary bypass graft: Secondary | ICD-10-CM

## 2018-12-24 DIAGNOSIS — E785 Hyperlipidemia, unspecified: Secondary | ICD-10-CM

## 2018-12-24 DIAGNOSIS — Z131 Encounter for screening for diabetes mellitus: Secondary | ICD-10-CM

## 2018-12-24 DIAGNOSIS — I10 Essential (primary) hypertension: Secondary | ICD-10-CM

## 2018-12-24 DIAGNOSIS — I2510 Atherosclerotic heart disease of native coronary artery without angina pectoris: Secondary | ICD-10-CM

## 2018-12-24 LAB — CBC
Hematocrit: 46.8 % (ref 37.5–51.0)
Hemoglobin: 15.7 g/dL (ref 13.0–17.7)
MCH: 28.2 pg (ref 26.6–33.0)
MCHC: 33.5 g/dL (ref 31.5–35.7)
MCV: 84 fL (ref 79–97)
Platelets: 268 10*3/uL (ref 150–450)
RBC: 5.56 x10E6/uL (ref 4.14–5.80)
RDW: 14.6 % (ref 11.6–15.4)
WBC: 11 10*3/uL — ABNORMAL HIGH (ref 3.4–10.8)

## 2018-12-24 LAB — HEPATIC FUNCTION PANEL
ALT: 46 IU/L — ABNORMAL HIGH (ref 0–44)
AST: 26 IU/L (ref 0–40)
Albumin: 4.4 g/dL (ref 3.8–4.9)
Alkaline Phosphatase: 88 IU/L (ref 39–117)
Bilirubin Total: 0.3 mg/dL (ref 0.0–1.2)
Bilirubin, Direct: 0.09 mg/dL (ref 0.00–0.40)
Total Protein: 6.9 g/dL (ref 6.0–8.5)

## 2018-12-24 LAB — BASIC METABOLIC PANEL
BUN/Creatinine Ratio: 11 (ref 9–20)
BUN: 9 mg/dL (ref 6–24)
CO2: 24 mmol/L (ref 20–29)
Calcium: 9.5 mg/dL (ref 8.7–10.2)
Chloride: 100 mmol/L (ref 96–106)
Creatinine, Ser: 0.83 mg/dL (ref 0.76–1.27)
GFR calc Af Amer: 112 mL/min/{1.73_m2} (ref >59)
GFR calc non Af Amer: 97 mL/min/{1.73_m2} (ref >59)
Glucose: 162 mg/dL — ABNORMAL HIGH (ref 65–99)
Potassium: 4.7 mmol/L (ref 3.5–5.2)
Sodium: 139 mmol/L (ref 134–144)

## 2018-12-24 LAB — LIPID PANEL
Chol/HDL Ratio: 3.7 ratio (ref 0.0–5.0)
Cholesterol, Total: 132 mg/dL (ref 100–199)
HDL: 36 mg/dL — ABNORMAL LOW (ref >39)
LDL Chol Calc (NIH): 57 mg/dL (ref 0–99)
Triglycerides: 242 mg/dL — ABNORMAL HIGH (ref 0–149)
VLDL Cholesterol Cal: 39 mg/dL (ref 5–40)

## 2018-12-24 LAB — HEMOGLOBIN A1C
Est. average glucose Bld gHb Est-mCnc: 160 mg/dL
Hgb A1c MFr Bld: 7.2 % — ABNORMAL HIGH (ref 4.8–5.6)

## 2018-12-24 NOTE — Progress Notes (Signed)
Cardiology Office Note   Date:  12/24/2018   ID:  Jimmy Collins, DOB [birth year], MRN [id number]  PCP:  Jimmy Collins, Jimmy T, MD  Cardiologist: Dr. Allyson Collins   CC: Dyspnea    History of Present Illness: Jimmy Collins is a 58 y.o. male who presents for ongoing assessment and management of coronary artery disease, with most recent cardiac catheterization on 05/02/2018 revealing high-grade distal left main disease and segmental proximal LAD disease with right to left collaterals and normal LV function.  As result of this the patient underwent CABG by Dr. Alla GermanVanTright, with LIMA to LAD and SVG to obtuse marginal branch.  He was last assessed by Dr. Allyson Collins on 10/01/2018 via virtual visit, and the patient was doing well and was asymptomatic.  No medication changes were made.  Mr. Jimmy Collins's wife called us on 12/09/2018 with concerns about her husband's reported symptoms of dyspnea on exertion.  He also complained of some left-sided chest pain when straining to lift something.  His weights have been stable.  Due to his dyspnea the patient had to stop his activities.  He was unable to climb stairs up to his apartment without having to stop to breathe.  As a result of this the patient was scheduled to be seen today.  Mr. Jimmy Collins is here with his wife, and they have decided that he should apply for disability due to his chronic breathing issues.  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  The patient was unable to complete these duties when he returned to work from CABG, and was asked to do some other maintenance after a party on the grounds where several people were diagnosed with COVID  He decided to leave his job as a result of the responsibilities and the exposure to those who had been diagnosed with  COVID.  He has since not been able to find work due to his health history and limitations concerning his breathing and musculoskeletal issues to include back pain.  The patient has quit smoking but has gained approximately 30 pounds since having had surgery.  His wife also states that he snores very loudly and she thinks he is having periods of apnea although she is not witnessed this, but she does hear him stop breathing for periods of time.  They would like cardiology to fill out paperwork concerning his inability to work based upon his cardiac status.  They are applying for disability and required disappointment for cardiology follow-up before filling out paperwork.  Past Medical History:  Diagnosis Date  . 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  Alpha-gal allergy  . Dyslipidemia   . GERD (gastroesophageal reflux disease)   . Kidney stones   . [facility]   . Smoker     Past Surgical History:  Procedure Laterality Date  . CORONARY ARTERY BYPASS GRAFT N/A 05/03/2018   Procedure: CORONARY ARTERY BYPASS GRAFTING (CABG), ON PUMP, TIMES TWO, USING LEFT INTERNAL MAMMARY ARTERY AND ENDOSCOPICALLY HARVESTED LEFT GREATER SAPHENOUS VEIN;  Surgeon: Kerin PernaVan Trigt, Peter, MD;  [facility];  Service: Open Heart Surgery;  Laterality: N/A;  . KIDNEY STONE SURGERY     bilateral  . KNEE SURGERY     left  . LEFT  HEART CATH AND CORONARY ANGIOGRAPHY N/A 05/02/2018   Procedure: LEFT HEART CATH AND CORONARY ANGIOGRAPHY;  Surgeon: Lorretta Harp, MD;  Location: [facility] CV LAB;  Service: Cardiovascular;  Laterality: N/A;  . TEE WITHOUT CARDIOVERSION N/A 05/03/2018   Procedure: TRANSESOPHAGEAL ECHOCARDIOGRAM (TEE);  Surgeon: Prescott Gum, Collier Salina, MD;  Location: [place];  Service: Open Heart Surgery;  Laterality: N/A;     Current Outpatient Medications  Medication Sig Dispense Refill  . aspirin EC 325 MG EC tablet Take 1 tablet (325 mg total) by mouth  daily. 30 tablet 0  . EPINEPHrine (EPIPEN 2-PAK) 0.3 mg/0.3 mL IJ SOAJ injection Inject 0.3 mLs (0.3 mg total) into the muscle once as needed (for severe allergic reaction). 1 Device 1  . lansoprazole (PREVACID) 30 MG capsule Take 30 mg by mouth daily.      . metoprolol succinate (TOPROL-XL) 25 MG 24 hr tablet Take 1 tablet (25 mg total) by mouth daily. 90 tablet 3  . nitroGLYCERIN (NITROSTAT) 0.4 MG SL tablet Place 1 tablet (0.4 mg total) under the tongue every 5 (five) minutes as needed for chest pain. 50 tablet 3  . rosuvastatin (CRESTOR) 20 MG tablet TAKE 1 TABLET(20 MG) BY MOUTH DAILY 90 tablet 1   No current facility-administered medications for this visit.     Allergies:   Other, Heparin, and Shellfish allergy    Social History:  The patient  reports that he has quit smoking. He smoked 1.00 pack per day. He has never used smokeless tobacco. He reports that he does not drink alcohol or use drugs.   Family History:  The patient's family history is not on file.    ROS: All other systems are reviewed and negative. Unless otherwise mentioned in H&P    PHYSICAL EXAM: VS:  BP 128/82   Pulse 79   Ht 5\' 5"  (1.651 m)   Wt 229 lb 9.6 oz (104.1 kg)   BMI 38.21 kg/m  , BMI Body mass index is 38.21 kg/m. GEN: Well nourished, well developed, in no acute distress, obese HEENT: normal Neck: no JVD, carotid bruits, or masses Cardiac: RRR; no murmurs, rubs, or gallops,no edema  Respiratory:  Clear to auscultation bilaterally, normal work of breathing GI: soft, nontender, nondistended, + BS MS: no deformity or atrophy Skin: warm and dry, no rash Neuro:  Strength and sensation are intact Psych: euthymic mood, full affect   EKG: Normal sinus rhythm, incomplete right bundle branch block, with nonspecific Collins wave abnormality.  Recent Labs: 05/03/2018: TSH 0.554 05/04/2018: Magnesium 2.2 05/16/2018: BUN 17; Creat 0.86; Hemoglobin 13.6; Platelets 626; Potassium 5.1; Sodium 135 09/20/2018: ALT  25    Lipid Panel    Component Value Date/Time   CHOL 119 09/20/2018 0816   TRIG 202 (H) 09/20/2018 0816   HDL 35 (L) 09/20/2018 0816   CHOLHDL 3.4 09/20/2018 0816   CHOLHDL 6.6 (H) 01/28/2018 0853   LDLCALC 44 09/20/2018 0816   LDLCALC 144 (H) 01/28/2018 0853      Wt Readings from Last 3 Encounters:  12/24/18 229 lb 9.6 oz (104.1 kg)  10/01/18 217 lb (98.4 kg)  09/11/18 220 lb (99.8 kg)      Other studies Reviewed: Cardiac Cath 05/02/2018  Ost LM to Mid LM lesion is 95% stenosed.  Prox LAD to Mid LAD lesion is 75% stenosed.  The left ventricular systolic function is normal.  LV end diastolic pressure is normal.  The left ventricular ejection fraction is 55-65% by visual estimate.  Echocardiogram  05/03/2018 1. The left ventricle has a 2D calculated ejection fraction of 61% by biplane volume. The cavity size was normal. Left ventricular diastolic parameters were normal.  2. The right ventricle has normal systolic function. The cavity was normal. There is no increase in right ventricular wall thickness. Right ventricular systolic pressure could not be assessed.  3. The mitral valve is normal in structure.  4. The tricuspid valve is normal in structure.  5. The aortic valve is normal in structure.  6. The pulmonic valve was normal in structure.  7. The aortic root and ascending aorta are normal in size and structure.  8. The inferior vena cava was dilated in size with >50% respiratory variability.  ASSESSMENT AND PLAN:  1.  Coronary artery disease: Multivessel seen by cardiac catheterization in February 2020, requiring coronary artery bypass grafting in February 2020, with LIMA to LAD and SVG to OM.  The patient did not participate in cardiac rehab due to Covid pandemic restrictions.  Since having surgery he has had no improvement in his energy, he is unable to climb stairs, do heavy lifting without significant pain in his shoulders and back, and has dyspnea on exertion.   He has become sedentary.  He has gained approximately 30 pounds.  He denies chest pain dizziness medical noncompliance,  PND orthopnea.  From a cardiac standpoint I do not have a reason for disability..  After having had recent bypass surgery with good response, I believe that he is still continuing to heal from soreness in his chest post surgery.  There is no evidence of fluid overload.  Deconditioning and obesity may be also playing a part in his breathing status and inability to do manual labor.  He has not seen his primary care physician since the first of the year, he has not had any labs or follow-up since that time.  I am going to order fasting lipids LFTs, hemoglobin A1c, CBC to evaluate his current status.  I would like to order an echocardiogram, but the patient does not have insurance and would like to wait on this.  We will decrease aspirin to 81 mg daily.  2.  Hypertension: Excellent control of blood pressure today.  He will continue on metoprolol as directed.  3.  Chronic dyspnea on exertion: Believe this is multifactorial in the setting of weight gain, deconditioning, and possible OSA.  His wife states that he snores very loudly and then sometimes stops breathing although she has not witnessed this, she states that she can hear that his snoring stops for a long period of time and she has to check on him to make sure that he is doing okay.  I would recommend that he have a sleep study completed through his primary care physician's office to assess for sleep apnea which could be contributing to his overall breathing status.  4.  Obesity: The patient has gained approximately 30 pounds since having surgery.  He is not as active.  He is advised on a low-cholesterol reduced calorie diet with increased activity to include walking 30 minutes a day.  Current medicines are reviewed at length with the patient today.    Labs/ tests ordered today include: Fasting lipids and LFTs, CBC, BMET,  hemoglobin A1c.  Bettey Mare. Liborio Nixon, ANP, AACC   12/24/2018 10:36 AM    [facility] [address] Office [phone] Fax [phone]  Notice: This dictation was prepared with Dragon dictation along with smaller phrase technology.  Any transcriptional errors that result from this process are unintentional and may not be corrected upon review.

## 2018-12-24 NOTE — Patient Instructions (Signed)
Medication Instructions:  Continue current medications  If you need a refill on your cardiac medications before your next appointment, please call your pharmacy.  Labwork: CBC, BMP, A1C, Fasting Lipid and liver HERE IN OUR OFFICE AT [facility]   You will need to fast. DO NOT EAT OR DRINK PAST MIDNIGHT.     Take the provided lab slips with you to the lab for your blood draw.   When you have your labs (blood work) drawn today and your tests are completely normal, you will receive your results only by MyChart Message (if you have MyChart) -OR-  A paper copy in the mail.  If you have any lab test that is abnormal or we need to change your treatment, we will call you to review these results.  Testing/Procedures: None Ordered  Follow-Up: You will need a follow up appointment in 6 months.  Please call our office 2 months in advance to schedule this appointment.  You may see Dr Gwenlyn Found or one of the following Advanced Practice Providers on your designated Care Team:   Kerin Ransom, PA-C Roby Lofts, [place] . Sande Rives, PA-C     At [facility], you and your health needs are our priority.  As part of our continuing mission to provide you with exceptional heart care, we have created designated Provider Care Teams.  These Care Teams include your primary Cardiologist (physician) and Advanced Practice Providers (APPs -  Physician Assistants and Nurse Practitioners) who all work together to provide you with the care you need, when you need it.  Thank you for choosing [facility] at [facility]!!

## 2018-12-27 ENCOUNTER — Telehealth: Payer: Self-pay | Admitting: Adult Health

## 2018-12-27 NOTE — Telephone Encounter (Signed)
New message   Patient's wife states that wants you to know social security administration will reach out to doctor for disability. Please advise.

## 2018-12-29 NOTE — Telephone Encounter (Signed)
From my point of view he is not disabled and I won't sign papers to that effect. He can ask his PCP

## 2018-12-30 ENCOUNTER — Other Ambulatory Visit: Payer: Self-pay

## 2018-12-30 ENCOUNTER — Encounter: Payer: Self-pay | Admitting: Family Medicine

## 2018-12-30 ENCOUNTER — Other Ambulatory Visit: Payer: Self-pay | Admitting: Family Medicine

## 2018-12-30 ENCOUNTER — Ambulatory Visit (INDEPENDENT_AMBULATORY_CARE_PROVIDER_SITE_OTHER): Payer: Managed Care, Other (non HMO) | Admitting: Family Medicine

## 2018-12-30 VITALS — BP 144/78 | HR 82 | Temp 97.8°F | Resp 18 | Ht 64.0 in | Wt 231.0 lb

## 2018-12-30 DIAGNOSIS — Z951 Presence of aortocoronary bypass graft: Secondary | ICD-10-CM

## 2018-12-30 DIAGNOSIS — M545 Low back pain, unspecified: Secondary | ICD-10-CM

## 2018-12-30 DIAGNOSIS — Z09 Encounter for follow-up examination after completed treatment for conditions other than malignant neoplasm: Secondary | ICD-10-CM

## 2018-12-30 DIAGNOSIS — I10 Essential (primary) hypertension: Secondary | ICD-10-CM

## 2018-12-30 DIAGNOSIS — F17200 Nicotine dependence, unspecified, uncomplicated: Secondary | ICD-10-CM

## 2018-12-30 MED ORDER — METFORMIN HCL 500 MG PO TABS: 500.0000 mg | ORAL_TABLET | Freq: Two times a day (BID) | ORAL | 3 refills | Status: DC

## 2018-12-30 NOTE — Telephone Encounter (Signed)
Requesting refill      LOV: 12/30/18  LRF:  08/22/18

## 2018-12-30 NOTE — Telephone Encounter (Signed)
Pt's wife updated and voiced understanding.

## 2018-12-30 NOTE — Progress Notes (Signed)
Subjective:    Patient ID: Jimmy Collins, male    DOB: February 06, 1961, 58 y.o.   MRN: [id number]  HPI  04/19/18 Patient states that over the last several weeks, at least 3 weeks, he has developed chest pain with activity.  He states if he walks up several flights of steps he will develop a pressure-like pain in the center of his chest that will radiate down his left arm.  He will also develop shortness of breath.  If he stops and rests the pain goes away quickly.  He denies any chest pain or chest pressure at rest but he does report worsening dyspnea on exertion.  EKG today in clinic shows normal sinus rhythm with a right bundle branch block and nonspecific ST segment changes in the lateral leads.  There is no overt sign of ischemia or infarction.  Patient denies any symptoms of unstable angina.  He is chest pain-free at the present time.  He is compliant with Crestor 20 mg a day.  However he is not taking anything for his blood pressure.  He is not taking aspirin.  He continues to smoke.  At that time, my plan was: Patient has new onset stable angina.  Therefore I want to arrange cardiology consultation as soon as possible for most likely catheterization and evaluation.  Meanwhile I want the patient start aspirin 81 mg a day.  Continue Crestor 20 mg a day.  Add Toprol-XL 25 mg a day due to his elevated blood pressure as well as for cardioprotective effects.  Strongly, strongly, strongly encourage smoking cessation.  Gave the patient a prescription for nitroglycerin.  Gave him instructions on how to take the nitroglycerin and when to take the nitroglycerin.  He is to call 911 immediately if he develops chest pain at rest.  He is to call 911 if he develops exertional chest pain that does not resolve with rest and/or nitroglycerin.  Otherwise will arrange urgent outpatient evaluation by cardiology.  Strongly advised the patient to avoid any strenuous activity until evaluated further.  05/16/18 Was admitted to the  hospital and had CABG: Admit date: 05/02/2018 Discharge date: 05/10/2018  Admission Diagnoses:     Patient Active Problem List   Diagnosis Date Noted   CAD (coronary artery disease) 05/02/2018   Essential hypertension 04/30/2018   Family history of heart disease 04/30/2018   Unstable angina (HCC) 04/30/2018   Dyslipidemia    Smoker    Mammalian Meat Allergy    GERD (gastroesophageal reflux disease)    GERD 04/28/2008   NEPHROLITHIASIS, HX OF 04/28/2008    Discharge Diagnoses:  Active Problems:   Unstable angina (HCC)   CAD (coronary artery disease)   S/P CABG x 2   Discharged Condition: good  HPI:   58 year old obese nondiabetic smoker presents with symptoms of unstable angina, mainly exertional. He was evaluated by [name] who recommended cardiac catheterization which was performed today. This shows a 95% distal left main stenosis. There is a moderate proximal 70 to 80% LAD stenosis. The RCA is dominant and without significant disease. LV ejection fraction is normal. LVEDP is 14. The patient is currently stable in the Cath Lab holding area without chest pain in sinus rhythm and with normal blood pressure. Surgical coronary revascularization is recommended. Chest x-ray, pre-CABG Dopplers, and blood work is pending.  Hospital Course:   On 05/03/2018 Jimmy Collins underwent an urgent coronary artery bypass grafting x2 by [name].  He tolerated procedure well was transferred to  the surgical ICU for continued care.  He was extubated timely manner.  Postop day 1 he remained hemodynamically stable on low-dose norepinephrine and dopamine.  His renal function remained stable.  He had minimal chest tube output.  Later that day he was extubated and was awake and alert.  He does have a longstanding history of smoking.  Postop day 2 we discontinued his arterial line and his chest tubes.  We continue diuresis for mild volume overload.  He remains on Levemir and  rarely needs sliding scale insulin.  He was not a diabetic preoperatively and his hemoglobin A1c was 6.0.  He will need lots of education on diet modification but hopefully will not need insulin for home.  He remained in normal sinus rhythm and no longer was needing any drips at this time.  He remained on 3 L nasal cannula with good oxygen saturation.  He continued to utilize his incentive spirometer and a flutter valve was added.  We initiated Mucinex for assistance with secretions.  He was then stable to transfer to the telemetry floor for continued care.  On the floor, he was weaned down to 1 L nasal cannula.  His chest x-ray remained stable.  He did remain in normal sinus rhythm therefore we discontinued his epicardial pacing wires.  We continue to encourage pulmonary toilet for weaning of oxygen and respiratory status.  Today, he is tolerating room air, his incisions are healing well, he is ambulating with limited assistance, and he is ready for discharge home.  05/16/18 Patient is doing remarkably well.  Surgical wound on his chest is healing nicely.  There is no evidence of cellulitis or induration or erythema.  There is some bruising on the medial surface of his right lower leg and left lower leg due to vein harvesting however there is no evidence of DVT.  There is no pitting edema in his legs.  There is no evidence of secondary cellulitis.  He is still taking Lasix and potassium.  His weight is stable.  He reports polyuria.  His lungs are completely clear to auscultation bilaterally.  There is no evidence of pulmonary edema or fluid overload on exam.  I question if he still needs the diuretic.  He denies any orthopnea or paroxysmal nocturnal dyspnea.  He is using Percocet occasionally for pain primarily due to the surgery.  He denies any pleurisy or evidence of pulmonary embolism.  He remains free from smoking.  I congratulated the patient on this.  His wife is working on smoking cessation as well.  He was  found to be a prediabetic with a hemoglobin A1c of 6.0.  We discussed low carbohydrate diet today.  At that time, my plan was: Spent 25 minutes today with the patient reviewing his medication list and hospital records.  Encouraged him to stay on aspirin metoprolol and Crestor for secondary prevention of cardiovascular disease.  Strongly encouraged him to continue to remain free from smoking.  I do believe the patient can discontinue Lasix and potassium today.  Encouraged the patient to monitor his weight on a daily basis and if he sees significant weight gain or swelling we may need to resume his Lasix.  We discussed low carbohydrate diet.  Check CBC and CMP today to monitor his perioperative leukocytosis.  Recheck fasting lab work in 3 months  12/30/18 Patient is here today reporting several complaints.  He states that he is unable to work.  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  He performed maintenance work.  This often requires him to lift heavy objects, to carry heavy objects, and to perform manual labor.  However as stated above, he begin to develop chest pain and angina.  Ultimately underwent CABG at the end of February.  However he states that since his surgery he continues to have dyspnea on exertion.  He states that any physical activity causes him to become extremely winded.  He states that simply walking from his car into the exam room because of shortness of breath.  He had a chest x-ray in May which revealed no abnormalities.  Since I last saw the patient, he has stopped working.  As result he has lost his health insurance.  Therefore further medical test to determine shortness of breath are going to be extremely expensive for the patient.  He has not had any evaluation for DVT or PE however there is no peripheral edema on exam today and he denies any pleurisy.  He does report sharp searing chest pain in his sternal area if he lifts  anything of any weight.  Simply lifting a gas can causes stabbing tearing chest pain where he had a surgery performed.  He is unable to lift more than 10 pounds without chest pain.  He states that he is unable to walk up a flight of steps without feeling short of breath.  He believes all of these factors keep him from being able to work at his previous job.  He also complains of low back pain.  He states that he saw a chiropractor approximately 5 years ago and had x-rays performed that he reportedly stated were abnormal.  However the chiropractor provided him no relief and therefore he stopped seeing the chiropractor.  We have not performed any x-rays of his back.  He denies any bowel or bladder incontinence.  He denies any radicular symptoms in his legs.  He denies any leg weakness.  He does have bilateral knee pain.  His left knee is worse.  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  He states that the pain in his knee continues to bother him and keep him from walking and working and climbing steps.   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  Alpha-gal allergy   Dyslipidemia    GERD (gastroesophageal reflux disease)    Kidney stones    [facility] spotted fever    Smoker    Past Surgical History:  Procedure Laterality Date   CORONARY ARTERY BYPASS GRAFT N/A 05/03/2018   Procedure: CORONARY ARTERY BYPASS GRAFTING (CABG), ON PUMP, TIMES TWO, USING LEFT INTERNAL MAMMARY ARTERY AND ENDOSCOPICALLY HARVESTED LEFT GREATER SAPHENOUS VEIN;  Surgeon: Ivin Poot, MD;  Location: [facility];  Service: Open Heart Surgery;  Laterality: N/A;   KIDNEY STONE SURGERY     bilateral   KNEE SURGERY     left   LEFT HEART CATH AND CORONARY ANGIOGRAPHY N/A 05/02/2018   Procedure: LEFT HEART CATH AND CORONARY ANGIOGRAPHY;  Surgeon: Lorretta Harp, MD;  Location:  [facility];  Service: Cardiovascular;  Laterality: N/A;   TEE WITHOUT CARDIOVERSION N/A 05/03/2018   Procedure: TRANSESOPHAGEAL ECHOCARDIOGRAM (TEE);  Surgeon: Prescott Gum, Collier Salina, MD;  Location: [facility];  Service: Open Heart Surgery;  Laterality: N/A;   Current Outpatient Medications on File Prior to Visit  Medication Sig Dispense Refill   aspirin EC 325 MG EC tablet Take 1 tablet (325 mg total) by mouth daily. 30 tablet 0   diazepam (VALIUM) 5 MG tablet Take 5 mg by mouth at bedtime.     EPINEPHrine (EPIPEN 2-PAK) 0.3 mg/0.3 mL IJ SOAJ injection Inject 0.3 mLs (0.3 mg total) into the muscle once as needed (for severe allergic reaction). 1 Device 1   lansoprazole (PREVACID) 30 MG capsule Take 30 mg by mouth daily.       metoprolol succinate (TOPROL-XL) 25 MG 24 hr tablet Take 1 tablet (25 mg total) by mouth daily. 90 tablet 3   nitroGLYCERIN (NITROSTAT) 0.4 MG SL tablet Place 1 tablet (0.4 mg total) under the tongue every 5 (five) minutes as needed for chest pain. 50 tablet 3   rosuvastatin (CRESTOR) 20 MG tablet TAKE 1 TABLET(20 MG) BY MOUTH DAILY 90 tablet 1   No current facility-administered medications on file prior to visit.    Allergies  Allergen Reactions   Other Hives, Itching and Swelling    Mammalian Meat Allergy   Heparin    Shellfish Allergy Hives and Rash   Social History   Socioeconomic History   Marital status: Married    Spouse name: Not on file   Number of children: Not on file   Years of education: Not on file   Highest education level: Not on file  Occupational History   Not on file  Social Needs   Financial resource strain: Not on file   Food insecurity    Worry: Not on file    Inability: Not on file   Transportation needs    Medical: Not on file    Non-medical: Not on file  Tobacco Use   Smoking status: Former Smoker    Packs/day: 1.00   Smokeless tobacco: Never Used  Substance and Sexual Activity   Alcohol use: No   Drug  use: Never   Sexual activity: Not on file  Lifestyle   Physical activity    Days per week: Not on file    Minutes per session: Not on file   Stress: Not on file  Relationships   Social connections    Talks on phone: Not on file    Gets together: Not on file    Attends religious service: Not on file    Active member of club or organization: Not on file    Attends meetings of clubs or organizations: Not on file    Relationship status: Not on file   Intimate partner violence    Fear of current or ex partner: Not on file    Emotionally abused: Not on file    Physically abused: Not on file    Forced sexual activity: Not on file  Other Topics Concern   Not on file  Social History Narrative   Not on file      Review of Systems  All other systems reviewed and are negative.      Objective:   Physical Exam  Constitutional: He is oriented to person, place, and time. He appears well-developed and well-nourished. No distress.  HENT:  Head: Normocephalic and atraumatic.  Right Ear: External ear normal.  Left Ear: External ear normal.  Nose: Nose normal.  Mouth/Throat: Oropharynx is clear and moist. No oropharyngeal exudate.  Eyes: Pupils are equal, round, and reactive to light. Conjunctivae and EOM are normal. Right eye  exhibits no discharge. Left eye exhibits no discharge. No scleral icterus.  Neck: Neck supple. No JVD present. No tracheal deviation present. No thyromegaly present.  Cardiovascular: Normal rate, regular rhythm, normal heart sounds and intact distal pulses. Exam reveals no gallop and no friction rub.  No murmur heard. Pulmonary/Chest: Effort normal and breath sounds normal. No stridor. No respiratory distress. He has no wheezes. He has no rales.  Abdominal: Soft. Bowel sounds are normal. He exhibits no distension and no mass. There is no abdominal tenderness. There is no rebound and no guarding.  Musculoskeletal:        General: No edema.  Lymphadenopathy:      He has no cervical adenopathy.  Neurological: He is alert and oriented to person, place, and time. He has normal reflexes. No cranial nerve deficit. He exhibits normal muscle tone. Coordination normal.  Skin: No rash noted. He is not diaphoretic.  Psychiatric: He has a normal mood and affect. His behavior is normal. Judgment and thought content normal.  Vitals reviewed.         Assessment & Plan:  Low back pain at multiple sites - Plan: DG Lumbar Spine Complete  S/P CABG x 2  Essential hypertension  Smoker  Hospital discharge follow-up  Patient has been released by his cardiologist.  Cardiology does not believe that his dyspnea on exertion is related to any cardiac issues.  His last chest x-ray was in May and this was normal as well.  Neck step would be CT scan of the lungs as well as pulmonary function test as well as an echocardiogram.  I have encouraged the patient and his wife to seek health insurance to help pay for these studies as obviously out-of-pocket expenses would be extraordinary.  He also has low back pain.  I have recommended that we obtain a basic x-ray of his spine to evaluate further.  If the patient has degenerative disc disease in the lumbar spine I would recommend physical therapy and possibly a consultation with orthopedics.  He is unable to tolerate NSAIDs due to his history of coronary artery disease.  I believe the patient would also benefit from cardiac rehab.  I believe a lot of his shortness of breath is due to deconditioning however he was unable to perform cardiac rehab after open heart surgery due to the COVID-19 pandemic.  Await the results of his lumbar x-ray.  Likely will recommend physical therapy.  Would also recommend cardiac rehab especially once the patient requires health insurance to help with deconditioning.  Would also get an echocardiogram and pulmonary function test at that time to complete work-up for dyspnea on exertion.

## 2018-12-30 NOTE — Telephone Encounter (Signed)
Patient calling to ger refill on diazepam  [facility]

## 2018-12-31 MED ORDER — DIAZEPAM 5 MG PO TABS: 5.0000 mg | ORAL_TABLET | Freq: Every day | ORAL | 1 refills | Status: DC

## 2019-01-13 ENCOUNTER — Other Ambulatory Visit: Payer: Self-pay | Admitting: Family Medicine

## 2019-01-13 NOTE — Telephone Encounter (Signed)
Pt's wife called and states that his valium refill went to wrong pharmacy can you resend it to [facility]. (Rx set up to go to [facility])

## 2019-01-14 MED ORDER — DIAZEPAM 5 MG PO TABS: 5.0000 mg | ORAL_TABLET | Freq: Every day | ORAL | 1 refills | Status: DC

## 2019-02-26 ENCOUNTER — Other Ambulatory Visit: Payer: Self-pay | Admitting: Family Medicine

## 2019-04-01 ENCOUNTER — Encounter: Payer: Self-pay | Admitting: Family Medicine

## 2019-05-02 ENCOUNTER — Telehealth: Payer: Self-pay | Admitting: Family Medicine

## 2019-05-02 NOTE — Telephone Encounter (Signed)
Pt's wife aware and will call back to schedule if he gets worse.

## 2019-05-02 NOTE — Telephone Encounter (Signed)
Patient hurt his back going under the house, wants to know if something can be prescribed for pain  [facility] [place]

## 2019-05-02 NOTE — Telephone Encounter (Signed)
Ntbs for any pain medication

## 2019-05-06 ENCOUNTER — Other Ambulatory Visit: Payer: Self-pay | Admitting: Family Medicine

## 2019-06-02 ENCOUNTER — Other Ambulatory Visit: Payer: Self-pay | Admitting: Family Medicine

## 2019-06-27 ENCOUNTER — Telehealth: Payer: Self-pay | Admitting: Family Medicine

## 2019-07-03 NOTE — Telephone Encounter (Signed)
error 

## 2019-07-09 ENCOUNTER — Telehealth: Payer: Self-pay | Admitting: Family Medicine

## 2019-07-09 NOTE — Chronic Care Management (AMB) (Signed)
  Chronic Care Management   Note  07/09/2019 Name: Jimmy Collins MRN: [id number] DOB: 1960-12-28  Jimmy Collins is a 60 y.o. year old male who is a primary care patient of Pickard, Priscille Heidelberg, MD. I reached out to Becky Augusta by phone today in response to a referral sent by Jimmy Collins's PCP, Donita Brooks, MD.   Mr. Kirtz was given information about Chronic Care Management services today including:  1. CCM service includes personalized support from designated clinical staff supervised by his physician, including individualized plan of care and coordination with other care providers 2. 24/7 contact phone numbers for assistance for urgent and routine care needs. 3. Service will only be billed when office clinical staff spend 20 minutes or more in a month to coordinate care. 4. Only one practitioner may furnish and bill the service in a calendar month. 5. The patient may stop CCM services at any time (effective at the end of the month) by phone call to the office staff.   Patient agreed to services and verbal consent obtained.   Follow up plan:   Myra Cody  [employer]

## 2019-07-16 ENCOUNTER — Telehealth: Payer: Self-pay | Admitting: Family Medicine

## 2019-07-16 NOTE — Telephone Encounter (Signed)
Patients wife is wanting to ask a question about a med she saw in the drug store for leg cramps, wants to know if it is safe to take  Please call [phone] and advise

## 2019-07-17 NOTE — Telephone Encounter (Signed)
LMTRC

## 2019-07-18 ENCOUNTER — Ambulatory Visit (INDEPENDENT_AMBULATORY_CARE_PROVIDER_SITE_OTHER): Payer: Self-pay | Admitting: Family Medicine

## 2019-07-18 ENCOUNTER — Encounter: Payer: Self-pay | Admitting: Family Medicine

## 2019-07-18 ENCOUNTER — Other Ambulatory Visit: Payer: Self-pay

## 2019-07-18 VITALS — BP 120/88 | HR 90 | Temp 96.5°F | Resp 16 | Ht 66.0 in | Wt 196.0 lb

## 2019-07-18 DIAGNOSIS — R634 Abnormal weight loss: Secondary | ICD-10-CM

## 2019-07-18 NOTE — Progress Notes (Signed)
Subjective:    Patient ID: Jimmy Collins, male    DOB: January 19, 1961, 59 y.o.   MRN: [id number]  HPI  04/19/18 Patient states that over the last several weeks, at least 3 weeks, he has developed chest pain with activity.  He states if he walks up several flights of steps he will develop a pressure-like pain in the center of his chest that will radiate down his left arm.  He will also develop shortness of breath.  If he stops and rests the pain goes away quickly.  He denies any chest pain or chest pressure at rest but he does report worsening dyspnea on exertion.  EKG today in clinic shows normal sinus rhythm with a right bundle branch block and nonspecific ST segment changes in the lateral leads.  There is no overt sign of ischemia or infarction.  Patient denies any symptoms of unstable angina.  He is chest pain-free at the present time.  He is compliant with Crestor 20 mg a day.  However he is not taking anything for his blood pressure.  He is not taking aspirin.  He continues to smoke.  At that time, my plan was: Patient has new onset stable angina.  Therefore I want to arrange cardiology consultation as soon as possible for most likely catheterization and evaluation.  Meanwhile I want the patient start aspirin 81 mg a day.  Continue Crestor 20 mg a day.  Add Toprol-XL 25 mg a day due to his elevated blood pressure as well as for cardioprotective effects.  Strongly, strongly, strongly encourage smoking cessation.  Gave the patient a prescription for nitroglycerin.  Gave him instructions on how to take the nitroglycerin and when to take the nitroglycerin.  He is to call 911 immediately if he develops chest pain at rest.  He is to call 911 if he develops exertional chest pain that does not resolve with rest and/or nitroglycerin.  Otherwise will arrange urgent outpatient evaluation by cardiology.  Strongly advised the patient to avoid any strenuous activity until evaluated further.  05/16/18 Was admitted to the  hospital and had CABG: Admit date: 05/02/2018 Discharge date: 05/10/2018  Admission Diagnoses:     Patient Active Problem List   Diagnosis Date Noted  . CAD (coronary artery disease) 05/02/2018  . Essential hypertension 04/30/2018  . Family history of heart disease 04/30/2018  . Unstable angina (HCC) 04/30/2018  . Dyslipidemia   . Smoker   . Mammalian Meat Allergy   . GERD (gastroesophageal reflux disease)   . GERD 04/28/2008  . NEPHROLITHIASIS, HX OF 04/28/2008    Discharge Diagnoses:  Active Problems:   Unstable angina (HCC)   CAD (coronary artery disease)   S/P CABG x 2   Discharged Condition: good  HPI:   59 year old obese nondiabetic smoker presents with symptoms of unstable angina, mainly exertional. He was evaluated by Dr. Allyson Collins who recommended cardiac catheterization which was performed today. This shows a 95% distal left main stenosis. There is a moderate proximal 70 to 80% LAD stenosis. The RCA is dominant and without significant disease. LV ejection fraction is normal. LVEDP is 14. The patient is currently stable in the Cath Lab holding area without chest pain in sinus rhythm and with normal blood pressure. Surgical coronary revascularization is recommended. Chest x-ray, pre-CABG Dopplers, and blood work is pending.  Hospital Course:   On 05/03/2018 Jimmy Collins underwent an urgent coronary artery bypass grafting x2 by Dr. Maren Collins.  He tolerated procedure well was transferred to  the surgical ICU for continued care.  He was extubated timely manner.  Postop day 1 he remained hemodynamically stable on low-dose norepinephrine and dopamine.  His renal function remained stable.  He had minimal chest tube output.  Later that day he was extubated and was awake and alert.  He does have a longstanding history of smoking.  Postop day 2 we discontinued his arterial line and his chest tubes.  We continue diuresis for mild volume overload.  He remains on Levemir and  rarely needs sliding scale insulin.  He was not a diabetic preoperatively and his hemoglobin A1c was 6.0.  He will need lots of education on diet modification but hopefully will not need insulin for home.  He remained in normal sinus rhythm and no longer was needing any drips at this time.  He remained on 3 L nasal cannula with good oxygen saturation.  He continued to utilize his incentive spirometer and a flutter valve was added.  We initiated Mucinex for assistance with secretions.  He was then stable to transfer to the telemetry floor for continued care.  On the floor, he was weaned down to 1 L nasal cannula.  His chest x-ray remained stable.  He did remain in normal sinus rhythm therefore we discontinued his epicardial pacing wires.  We continue to encourage pulmonary toilet for weaning of oxygen and respiratory status.  Today, he is tolerating room air, his incisions are healing well, he is ambulating with limited assistance, and he is ready for discharge home.  05/16/18 Patient is doing remarkably well.  Surgical wound on his chest is healing nicely.  There is no evidence of cellulitis or induration or erythema.  There is some bruising on the medial surface of his right lower leg and left lower leg due to vein harvesting however there is no evidence of DVT.  There is no pitting edema in his legs.  There is no evidence of secondary cellulitis.  He is still taking Lasix and potassium.  His weight is stable.  He reports polyuria.  His lungs are completely clear to auscultation bilaterally.  There is no evidence of pulmonary edema or fluid overload on exam.  I question if he still needs the diuretic.  He denies any orthopnea or paroxysmal nocturnal dyspnea.  He is using Percocet occasionally for pain primarily due to the surgery.  He denies any pleurisy or evidence of pulmonary embolism.  He remains free from smoking.  I congratulated the patient on this.  His wife is working on smoking cessation as well.  He was  found to be a prediabetic with a hemoglobin A1c of 6.0.  We discussed low carbohydrate diet today.  At that time, my plan was: Spent 25 minutes today with the patient reviewing his medication list and hospital records.  Encouraged him to stay on aspirin metoprolol and Crestor for secondary prevention of cardiovascular disease.  Strongly encouraged him to continue to remain free from smoking.  I do believe the patient can discontinue Lasix and potassium today.  Encouraged the patient to monitor his weight on a daily basis and if he sees significant weight gain or swelling we may need to resume his Lasix.  We discussed low carbohydrate diet.  Check CBC and CMP today to monitor his perioperative leukocytosis.  Recheck fasting lab work in 3 months  07/18/19 Patient has not had his lab work checked since October 2020.  He is currently on Metformin however he is not checking his sugars.  He  presents today complaining of muscle cramps in his legs at night.  He will wake up with cramps in his calves and in his hands occasionally.  He also reports polyuria.  He states that he is having to urinate every hour.  He had urinating strong stream.  He denies any dysuria, urgency, or frequency.  He also reports polydipsia.  He reports a dry mouth and is unable to get enough to drink.  Since I last saw the patient in October, his weight is dropped almost 40 pounds!.  He does occasionally also have blurry vision.   Past Medical History:  Diagnosis Date  . 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  Alpha-gal allergy  . Dyslipidemia   . GERD (gastroesophageal reflux disease)   . Kidney stones   . [facility] spotted fever   . Smoker    Past Surgical History:  Procedure Laterality Date  . CORONARY ARTERY BYPASS GRAFT N/A 05/03/2018   Procedure: CORONARY ARTERY BYPASS GRAFTING (CABG), ON PUMP, TIMES TWO, USING LEFT INTERNAL MAMMARY ARTERY AND ENDOSCOPICALLY HARVESTED LEFT GREATER  SAPHENOUS VEIN;  Surgeon: Kerin Perna, MD;  Location: [facility] OR;  Service: Open Heart Surgery;  Laterality: N/A;  . KIDNEY STONE SURGERY     bilateral  . KNEE SURGERY     left  . LEFT HEART CATH AND CORONARY ANGIOGRAPHY N/A 05/02/2018   Procedure: LEFT HEART CATH AND CORONARY ANGIOGRAPHY;  Surgeon: Runell Gess, MD;  Location: [facility] INVASIVE CV LAB;  Service: Cardiovascular;  Laterality: N/A;  . TEE WITHOUT CARDIOVERSION N/A 05/03/2018   Procedure: TRANSESOPHAGEAL ECHOCARDIOGRAM (TEE);  Surgeon: Donata Clay, Theron Arista, MD;  Location[facility] OR;  Service: Open Heart Surgery;  Laterality: N/A;   Current Outpatient Medications on File Prior to Visit  Medication Sig Dispense Refill  . aspirin EC 325 MG EC tablet Take 1 tablet (325 mg total) by mouth daily. 30 tablet 0  . diazepam (VALIUM) 5 MG tablet Take 1 tablet (5 mg total) by mouth at bedtime. 30 tablet 1  . EPINEPHrine (EPIPEN 2-PAK) 0.3 mg/0.3 mL IJ SOAJ injection Inject 0.3 mLs (0.3 mg total) into the muscle once as needed (for severe allergic reaction). 1 Device 1  . lansoprazole (PREVACID) 30 MG capsule Take 30 mg by mouth daily.      . metFORMIN (GLUCOPHAGE) 500 MG tablet Take 1 tablet (500 mg total) by mouth 2 (two) times daily with a meal. 60 tablet 3  . metoprolol succinate (TOPROL-XL) 25 MG 24 hr tablet TAKE ONE TABLET BY MOUTH DAILY 30 tablet 5  . nitroGLYCERIN (NITROSTAT) 0.4 MG SL tablet Place 1 tablet (0.4 mg total) under the tongue every 5 (five) minutes as needed for chest pain. 50 tablet 3  . rosuvastatin (CRESTOR) 20 MG tablet TAKE ONE TABLET BY MOUTH DAILY 90 tablet 3   No current facility-administered medications on file prior to visit.   Allergies  Allergen Reactions  . Other Hives, Itching and Swelling    Mammalian Meat Allergy  . Heparin   . Shellfish Allergy Hives and Rash   Social History   Socioeconomic History  . Marital status: Married    Spouse name: Not on file  . Number of children: Not on file  . Years of  education: Not on file  . Highest education level: Not on file  Occupational History  . Not on file  Tobacco Use  . Smoking status: Former Smoker    Packs/day:  1.00  . Smokeless tobacco: Never Used  Substance and Sexual Activity  . Alcohol use: No  . Drug use: Never  . Sexual activity: Not on file  Other Topics Concern  . Not on file  Social History Narrative  . Not on file   Social Determinants of Health   Financial Resource Strain:   . Difficulty of Paying Living Expenses:   Food Insecurity:   . Worried About Charity fundraiser in the Last Year:   . Arboriculturist in the Last Year:   Transportation Needs:   . Film/video editor (Medical):   Marland Kitchen Lack of Transportation (Non-Medical):   Physical Activity:   . Days of Exercise per Week:   . Minutes of Exercise per Session:   Stress:   . Feeling of Stress :   Social Connections:   . Frequency of Communication with Friends and Family:   . Frequency of Social Gatherings with Friends and Family:   . Attends Religious Services:   . Active Member of Clubs or Organizations:   . Attends Archivist Meetings:   Marland Kitchen Marital Status:   Intimate Partner Violence:   . Fear of Current or Ex-Partner:   . Emotionally Abused:   Marland Kitchen Physically Abused:   . Sexually Abused:       Review of Systems  All other systems reviewed and are negative.      Objective:   Physical Exam  Constitutional: He is oriented to person, place, and time. He appears well-developed and well-nourished. No distress.  HENT:  Head: Normocephalic and atraumatic.  Right Ear: External ear normal.  Left Ear: External ear normal.  Nose: Nose normal.  Mouth/Throat: Oropharynx is clear and moist. No oropharyngeal exudate.  Eyes: Pupils are equal, round, and reactive to light. Conjunctivae and EOM are normal. Right eye exhibits no discharge. Left eye exhibits no discharge. No scleral icterus.  Neck: No JVD present. No tracheal deviation present. No  thyromegaly present.  Cardiovascular: Normal rate, regular rhythm, normal heart sounds and intact distal pulses. Exam reveals no gallop and no friction rub.  No murmur heard. Pulmonary/Chest: Effort normal and breath sounds normal. No stridor. No respiratory distress. He has no wheezes. He has no rales.  Abdominal: Soft. Bowel sounds are normal. He exhibits no distension and no mass. There is no abdominal tenderness. There is no rebound and no guarding.  Musculoskeletal:        General: No edema.     Cervical back: Neck supple.  Lymphadenopathy:    He has no cervical adenopathy.  Neurological: He is alert and oriented to person, place, and time. He has normal reflexes. No cranial nerve deficit. He exhibits normal muscle tone. Coordination normal.  Skin: No rash noted. He is not diaphoretic.  Psychiatric: He has a normal mood and affect. His behavior is normal. Judgment and thought content normal.  Vitals reviewed.         Assessment & Plan:  Weight loss - Plan: Hemoglobin A1c, CBC with Differential/Platelet, COMPLETE METABOLIC PANEL WITH GFR, TSH  I suspect that the patient likely is having cramps and dry mouth due to dehydration and electrolyte disturbances.  My problem suspect will be uncontrolled diabetes with hyperglycemia causing polyuria and leading to dehydration.  I encouraged the patient to drink any water in G0 over the weekend while we obtain lab studies to evaluate further.  I will check a CBC, CMP, TSH, and hemoglobin A1c.  I anticipate that his A1c  will be extremely high.  We will likely need to start additional medication immediately to help better control his blood sugar to stop the polyuria and therefore improve muscle cramps.

## 2019-07-18 NOTE — Telephone Encounter (Signed)
Has apt today for leg cramps

## 2019-07-19 ENCOUNTER — Emergency Department (HOSPITAL_COMMUNITY)
Admission: EM | Admit: 2019-07-19 | Discharge: 2019-07-19 | Disposition: A | Discharge status: Home / Self Care | Payer: Self-pay | Attending: Emergency Medicine | Admitting: Emergency Medicine

## 2019-07-19 ENCOUNTER — Other Ambulatory Visit: Payer: Self-pay

## 2019-07-19 ENCOUNTER — Encounter (HOSPITAL_COMMUNITY): Payer: Self-pay | Admitting: Emergency Medicine

## 2019-07-19 DIAGNOSIS — E1165 Type 2 diabetes mellitus with hyperglycemia: Secondary | ICD-10-CM | POA: Insufficient documentation

## 2019-07-19 DIAGNOSIS — Z951 Presence of aortocoronary bypass graft: Secondary | ICD-10-CM | POA: Insufficient documentation

## 2019-07-19 DIAGNOSIS — R739 Hyperglycemia, unspecified: Secondary | ICD-10-CM

## 2019-07-19 DIAGNOSIS — I10 Essential (primary) hypertension: Secondary | ICD-10-CM | POA: Insufficient documentation

## 2019-07-19 DIAGNOSIS — I2510 Atherosclerotic heart disease of native coronary artery without angina pectoris: Secondary | ICD-10-CM | POA: Insufficient documentation

## 2019-07-19 DIAGNOSIS — Z87891 Personal history of nicotine dependence: Secondary | ICD-10-CM | POA: Insufficient documentation

## 2019-07-19 DIAGNOSIS — Z7982 Long term (current) use of aspirin: Secondary | ICD-10-CM | POA: Insufficient documentation

## 2019-07-19 DIAGNOSIS — Z7984 Long term (current) use of oral hypoglycemic drugs: Secondary | ICD-10-CM | POA: Insufficient documentation

## 2019-07-19 DIAGNOSIS — Z79899 Other long term (current) drug therapy: Secondary | ICD-10-CM | POA: Insufficient documentation

## 2019-07-19 HISTORY — DX: Type 2 diabetes mellitus without complications: E11.9

## 2019-07-19 HISTORY — DX: Essential (primary) hypertension: I10

## 2019-07-19 LAB — BLOOD GAS, VENOUS
Acid-Base Excess: 2.9 mmol/L — ABNORMAL HIGH (ref 0.0–2.0)
Bicarbonate: 28.8 mmol/L — ABNORMAL HIGH (ref 20.0–28.0)
O2 Saturation: 55.7 %
Patient temperature: 98.6
pCO2, Ven: 50.1 mmHg (ref 44.0–60.0)
pH, Ven: 7.377 (ref 7.250–7.430)
pO2, Ven: 33 mmHg (ref 32.0–45.0)

## 2019-07-19 LAB — CBC
HCT: 51 % (ref 39.0–52.0)
Hemoglobin: 17.5 g/dL — ABNORMAL HIGH (ref 13.0–17.0)
MCH: 28.5 pg (ref 26.0–34.0)
MCHC: 34.3 g/dL (ref 30.0–36.0)
MCV: 83.2 fL (ref 80.0–100.0)
Platelets: 236 10*3/uL (ref 150–400)
RBC: 6.13 MIL/uL — ABNORMAL HIGH (ref 4.22–5.81)
RDW: 13.4 % (ref 11.5–15.5)
WBC: 13.6 10*3/uL — ABNORMAL HIGH (ref 4.0–10.5)
nRBC: 0 % (ref 0.0–0.2)

## 2019-07-19 LAB — CBG MONITORING, ED
Glucose-Capillary: 346 mg/dL — ABNORMAL HIGH (ref 70–99)
Glucose-Capillary: 358 mg/dL — ABNORMAL HIGH (ref 70–99)
Glucose-Capillary: 270 mg/dL — ABNORMAL HIGH (ref 70–99)

## 2019-07-19 LAB — COMPREHENSIVE METABOLIC PANEL
ALT: 48 U/L — ABNORMAL HIGH (ref 0–44)
AST: 31 U/L (ref 15–41)
Albumin: 4.1 g/dL (ref 3.5–5.0)
Alkaline Phosphatase: 95 U/L (ref 38–126)
Anion gap: 14 (ref 5–15)
BUN: 13 mg/dL (ref 6–20)
CO2: 28 mmol/L (ref 22–32)
Calcium: 9.4 mg/dL (ref 8.9–10.3)
Chloride: 92 mmol/L — ABNORMAL LOW (ref 98–111)
Creatinine, Ser: 0.8 mg/dL (ref 0.61–1.24)
GFR calc Af Amer: >60 mL/min (ref >60)
GFR calc non Af Amer: >60 mL/min (ref >60)
Glucose, Bld: 345 mg/dL — ABNORMAL HIGH (ref 70–99)
Potassium: 4.6 mmol/L (ref 3.5–5.1)
Sodium: 134 mmol/L — ABNORMAL LOW (ref 135–145)
Total Bilirubin: 1.2 mg/dL (ref 0.3–1.2)
Total Protein: 7.8 g/dL (ref 6.5–8.1)

## 2019-07-19 LAB — COMPLETE METABOLIC PANEL WITH GFR
AG Ratio: 1.4 (calc) (ref 1.0–2.5)
ALT: 43 U/L (ref 9–46)
AST: 25 U/L (ref 10–35)
Albumin: 4 g/dL (ref 3.6–5.1)
Alkaline phosphatase (APISO): 118 U/L (ref 35–144)
BUN: 14 mg/dL (ref 7–25)
CO2: 23 mmol/L (ref 20–32)
Calcium: 10.4 mg/dL — ABNORMAL HIGH (ref 8.6–10.3)
Chloride: 87 mmol/L — ABNORMAL LOW (ref 98–110)
Creat: 0.96 mg/dL (ref 0.70–1.33)
GFR, Est African American: 100 mL/min/{1.73_m2} (ref >=60)
GFR, Est Non African American: 86 mL/min/{1.73_m2} (ref >=60)
Globulin: 2.9 g/dL (calc) (ref 1.9–3.7)
Glucose, Bld: 738 mg/dL (ref 65–99)
Potassium: 5.1 mmol/L (ref 3.5–5.3)
Sodium: 127 mmol/L — ABNORMAL LOW (ref 135–146)
Total Bilirubin: 0.5 mg/dL (ref 0.2–1.2)
Total Protein: 6.9 g/dL (ref 6.1–8.1)

## 2019-07-19 LAB — URINALYSIS, ROUTINE W REFLEX MICROSCOPIC
Bacteria, UA: NONE SEEN
Bilirubin Urine: NEGATIVE
Glucose, UA: >=500 mg/dL — AB
Hgb urine dipstick: NEGATIVE
Ketones, ur: 20 mg/dL — AB
Leukocytes,Ua: NEGATIVE
Nitrite: NEGATIVE
Protein, ur: NEGATIVE mg/dL
Specific Gravity, Urine: 1.035 — ABNORMAL HIGH (ref 1.005–1.030)
pH: 5 (ref 5.0–8.0)

## 2019-07-19 LAB — CBC WITH DIFFERENTIAL/PLATELET
Absolute Monocytes: 942 cells/uL (ref 200–950)
Basophils Absolute: 161 cells/uL (ref 0–200)
Basophils Relative: 1.3 %
Eosinophils Absolute: 657 cells/uL — ABNORMAL HIGH (ref 15–500)
Eosinophils Relative: 5.3 %
HCT: 51.8 % — ABNORMAL HIGH (ref 38.5–50.0)
Hemoglobin: 16.7 g/dL (ref 13.2–17.1)
Lymphs Abs: 2641 cells/uL (ref 850–3900)
MCH: 28 pg (ref 27.0–33.0)
MCHC: 32.2 g/dL (ref 32.0–36.0)
MCV: 86.8 fL (ref 80.0–100.0)
MPV: 12.1 fL (ref 7.5–12.5)
Monocytes Relative: 7.6 %
Neutro Abs: 7998 cells/uL — ABNORMAL HIGH (ref 1500–7800)
Neutrophils Relative %: 64.5 %
Platelets: 249 10*3/uL (ref 140–400)
RBC: 5.97 10*6/uL — ABNORMAL HIGH (ref 4.20–5.80)
RDW: 13.3 % (ref 11.0–15.0)
Total Lymphocyte: 21.3 %
WBC: 12.4 10*3/uL — ABNORMAL HIGH (ref 3.8–10.8)

## 2019-07-19 LAB — HEMOGLOBIN A1C: Hgb A1c MFr Bld: >14 % of total Hgb — ABNORMAL HIGH (ref <5.7)

## 2019-07-19 LAB — LIPASE, BLOOD: Lipase: 25 U/L (ref 11–51)

## 2019-07-19 LAB — TSH: TSH: 0.96 mIU/L (ref 0.40–4.50)

## 2019-07-19 MED ORDER — INSULIN ASPART 100 UNIT/ML ~~LOC~~ SOLN
5.0000 [IU] | Freq: Once | SUBCUTANEOUS | Status: AC
  Administered 2019-07-19: 13:00:00 5 [IU] via INTRAVENOUS
  Filled 2019-07-19: qty 0.05

## 2019-07-19 MED ORDER — SODIUM CHLORIDE 0.9 % IV BOLUS
1000.0000 mL | Freq: Once | INTRAVENOUS | Status: AC
  Administered 2019-07-19: 13:00:00 1000 mL via INTRAVENOUS

## 2019-07-19 MED ORDER — SODIUM CHLORIDE 0.9 % IV BOLUS
1000.0000 mL | Freq: Once | INTRAVENOUS | Status: AC
  Administered 2019-07-19: 1000 mL via INTRAVENOUS

## 2019-07-19 NOTE — ED Provider Notes (Signed)
[facility] DEPT Provider Note   CSN: [id number] Arrival date & time: 07/19/19  0946     History Chief Complaint  Patient presents with  . Hyperglycemia    Jimmy Collins is a 59 y.o. male.  HPI     Patient presents with concern of fatigue, polyuria, polydipsia, nausea. It seems as though the symptoms began about 2 weeks ago, have been progressive. He takes metformin, as well as multiple other medications, with no medication changes recently. With his symptoms he spoke with his physician, have blood work performed, and after he was notified of abnormal values he was sent here for evaluation. He denies focal pain, denies fever, denies actual vomiting, but with symptoms as above he presents for evaluation.  Past Medical History:  Diagnosis Date  . 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  Alpha-gal allergy  . Diabetes mellitus without complication ([place])   . Dyslipidemia   . GERD (gastroesophageal reflux disease)   . Hypertension   . Kidney stones   . [facility] spotted fever   . Smoker     Patient Active Problem List   Diagnosis Date Noted  . S/P CABG x 2 05/04/2018  . CAD (coronary artery disease) 05/02/2018  . Essential hypertension 04/30/2018  . Family history of heart disease 04/30/2018  . Unstable angina ([place]) 04/30/2018  . Dyslipidemia   . Smoker   . Mammalian Meat Allergy   . GERD (gastroesophageal reflux disease)   . GERD 04/28/2008  . NEPHROLITHIASIS, HX OF 04/28/2008    Past Surgical History:  Procedure Laterality Date  . CORONARY ARTERY BYPASS GRAFT N/A 05/03/2018   Procedure: CORONARY ARTERY BYPASS GRAFTING (CABG), ON PUMP, TIMES TWO, USING LEFT INTERNAL MAMMARY ARTERY AND ENDOSCOPICALLY HARVESTED LEFT GREATER SAPHENOUS VEIN;  Surgeon: Ivin Poot, MD;  Location: [place];  Service: Open Heart Surgery;  Laterality: N/A;  . KIDNEY STONE SURGERY     bilateral  . KNEE SURGERY     left   . LEFT HEART CATH AND CORONARY ANGIOGRAPHY N/A 05/02/2018   Procedure: LEFT HEART CATH AND CORONARY ANGIOGRAPHY;  Surgeon: Lorretta Harp, MD;  Location: [place] CV LAB;  Service: Cardiovascular;  Laterality: N/A;  . TEE WITHOUT CARDIOVERSION N/A 05/03/2018   Procedure: TRANSESOPHAGEAL ECHOCARDIOGRAM (TEE);  Surgeon: Prescott Gum, Collier Salina, MD;  Location: [place];  Service: Open Heart Surgery;  Laterality: N/A;       No family history on file.  Social History   Tobacco Use  . Smoking status: Former Smoker    Packs/day: 1.00  . Smokeless tobacco: Never Used  Substance Use Topics  . Alcohol use: No  . Drug use: Never    Home Medications Prior to Admission medications   Medication Sig Start Date End Date Taking? Authorizing Provider  aspirin EC 325 MG EC tablet Take 1 tablet (325 mg total) by mouth daily. 05/10/18  Yes Barrett, Erin R, PA-C  diazepam (VALIUM) 5 MG tablet Take 1 tablet (5 mg total) by mouth at bedtime. Patient taking differently: Take 5 mg by mouth at bedtime as needed (sleep).  01/14/19  Yes Susy Frizzle, MD  EPINEPHrine (EPIPEN 2-PAK) 0.3 mg/0.3 mL IJ SOAJ injection Inject 0.3 mLs (0.3 mg total) into the muscle once as needed (for severe allergic reaction). 01/30/18  Yes Susy Frizzle, MD  lansoprazole (PREVACID) 30 MG capsule Take 30 mg by mouth daily.     Yes [provider]  metFORMIN (GLUCOPHAGE) 500 MG tablet Take 1 tablet (500 mg total) by mouth 2 (two) times daily with a meal. 12/30/18  Yes Donita Brooks, MD  metoprolol succinate (TOPROL-XL) 25 MG 24 hr tablet TAKE ONE TABLET BY MOUTH DAILY Patient taking differently: Take 25 mg by mouth daily.  06/02/19  Yes Donita Brooks, MD  nitroGLYCERIN (NITROSTAT) 0.4 MG SL tablet Place 1 tablet (0.4 mg total) under the tongue every 5 (five) minutes as needed for chest pain. 04/19/18  Yes Donita Brooks, MD  rosuvastatin (CRESTOR) 20 MG tablet TAKE ONE TABLET BY MOUTH DAILY Patient taking  differently: Take 20 mg by mouth daily.  02/26/19  Yes Donita Brooks, MD    Allergies    Other, Heparin, and Shellfish allergy  Review of Systems   Review of Systems  Constitutional:       Per HPI, otherwise negative  HENT:       Per HPI, otherwise negative  Respiratory:       Per HPI, otherwise negative  Cardiovascular:       Per HPI, otherwise negative  Gastrointestinal: Positive for nausea. Negative for vomiting.  Endocrine:       Negative aside from HPI  Genitourinary:       Neg aside from HPI   Musculoskeletal:       Per HPI, otherwise negative  Skin: Negative.   Neurological: Positive for weakness. Negative for syncope.    Physical Exam Updated Vital Signs BP 110/75   Pulse 72   Temp 97.8 F (36.6 C) (Oral)   Resp 18   SpO2 99%   Physical Exam Vitals and nursing note reviewed.  Constitutional:      General: He is not in acute distress.    Appearance: He is well-developed.  HENT:     Head: Normocephalic and atraumatic.  Eyes:     Conjunctiva/sclera: Conjunctivae normal.  Cardiovascular:     Rate and Rhythm: Normal rate and regular rhythm.  Pulmonary:     Effort: Pulmonary effort is normal. No respiratory distress.     Breath sounds: No stridor.  Abdominal:     General: There is no distension.  Skin:    General: Skin is warm and dry.  Neurological:     Mental Status: He is alert and oriented to person, place, and time.     ED Results / Procedures / Treatments   Labs (all labs ordered are listed, but only abnormal results are displayed) Labs Reviewed  CBC - Abnormal; Notable for the following components:      Result Value   WBC 13.6 (*)    RBC 6.13 (*)    Hemoglobin 17.5 (*)    All other components within normal limits  URINALYSIS, ROUTINE W REFLEX MICROSCOPIC - Abnormal; Notable for the following components:   Specific Gravity, Urine 1.035 (*)    Glucose, UA >=500 (*)    Ketones, ur 20 (*)    All other components within normal limits    COMPREHENSIVE METABOLIC PANEL - Abnormal; Notable for the following components:   Sodium 134 (*)    Chloride 92 (*)    Glucose, Bld 345 (*)    ALT 48 (*)    All other components within normal limits  BLOOD GAS, VENOUS - Abnormal; Notable for the following components:   Bicarbonate 28.8 (*)    Acid-Base Excess 2.9 (*)    All other components within normal limits  CBG MONITORING, ED - Abnormal;  Notable for the following components:   Glucose-Capillary 346 (*)    All other components within normal limits  CBG MONITORING, ED - Abnormal; Notable for the following components:   Glucose-Capillary 358 (*)    All other components within normal limits  CBG MONITORING, ED - Abnormal; Notable for the following components:   Glucose-Capillary 270 (*)    All other components within normal limits  LIPASE, BLOOD   Procedures Procedures (including critical care time)  Medications Ordered in ED Medications  sodium chloride 0.9 % bolus 1,000 mL (0 mLs Intravenous Stopped 07/19/19 1329)  sodium chloride 0.9 % bolus 1,000 mL (0 mLs Intravenous Stopped 07/19/19 1553)  insulin aspart (novoLOG) injection 5 Units (5 Units Intravenous Given 07/19/19 1326)    ED Course  I have reviewed the triage vital signs and the nursing notes.  Pertinent labs & imaging results that were available during my care of the patient were reviewed by me and considered in my medical decision making (see chart for details).   With concern for DKA versus dehydration versus infection versus other causes for his generalized weakness, polydipsia, patient had labs, urinalysis, IV fluids ordered after initial evaluation.  Update:, Initial labs notable for negligible anion gap, though he does have mild ketone urea. After 1 L fluid resuscitation the patient is feeling better, states that he is so.  He is accompanied by his wife.  We discussed all findings again, concern for dehydration, DKA versus hyperglycemia.  Patient has received 2  L fluid resuscitation, initial insulin, glucose has diminished, he is feeling better, is awake, alert, hemodynamically unremarkable. Given the substantial improvement here patient is appropriate for discharge.  He is amenable to following up with his primary care physician for discussion of his anti hyperglycemic regiment.  Patient will monitor his condition carefully, drink plenty of fluids.  This adult male with non-insulin-dependent diabetes presents with weakness. Patient is initially found to be hyperglycemic, though not as substantial as he was on yesterday's outpatient labs. Patient has evidence for mild dehydration, ketonuria, but no substantial anion gap. Patient received substantial resuscitation with fluids, insulin, had substantial improvement.  No evidence for concurrent infection, or other acute new pathology. Patient discharged in stable condition. Final Clinical Impression(s) / ED Diagnoses Final diagnoses:  Hyperglycemia     Gerhard Munch, MD 07/19/19 [phone]

## 2019-07-19 NOTE — ED Notes (Signed)
Discharge paperwork reviewed with pt. Pt with no questions or concerns at this time, ambulatory at discharge.  Wife in lobby to take pt home.

## 2019-07-19 NOTE — ED Notes (Signed)
Family at bedside. 

## 2019-07-19 NOTE — ED Triage Notes (Signed)
Per pt, states his PCP told him to come to ED for elevated blood sugar of 200-states he is on Metformin-increased thirst, urination, blurred vision and lower extremity neuropathy-symptoms for 2 weeks

## 2019-07-19 NOTE — Discharge Instructions (Addendum)
As discussed, it is very portly follow-up with your physician.  Please discuss today's presentation and your regimen to control your diabetes.  In the interim, please stay well-hydrated, and monitor your condition carefully.  Return here for concerning changes.

## 2019-07-21 ENCOUNTER — Other Ambulatory Visit: Payer: Self-pay | Admitting: Family Medicine

## 2019-07-21 ENCOUNTER — Encounter: Payer: Self-pay | Admitting: Family Medicine

## 2019-07-21 ENCOUNTER — Ambulatory Visit (INDEPENDENT_AMBULATORY_CARE_PROVIDER_SITE_OTHER): Payer: Self-pay | Admitting: Family Medicine

## 2019-07-21 ENCOUNTER — Other Ambulatory Visit: Payer: Self-pay

## 2019-07-21 VITALS — BP 126/82 | HR 84 | Temp 96.1°F | Resp 14 | Ht 66.0 in | Wt 198.0 lb

## 2019-07-21 DIAGNOSIS — I10 Essential (primary) hypertension: Secondary | ICD-10-CM

## 2019-07-21 DIAGNOSIS — Z951 Presence of aortocoronary bypass graft: Secondary | ICD-10-CM

## 2019-07-21 DIAGNOSIS — E1165 Type 2 diabetes mellitus with hyperglycemia: Secondary | ICD-10-CM

## 2019-07-21 DIAGNOSIS — E1159 Type 2 diabetes mellitus with other circulatory complications: Secondary | ICD-10-CM

## 2019-07-21 DIAGNOSIS — IMO0002 Reserved for concepts with insufficient information to code with codable children: Secondary | ICD-10-CM

## 2019-07-21 MED ORDER — PIOGLITAZONE HCL 30 MG PO TABS: 30.0000 mg | ORAL_TABLET | Freq: Every day | ORAL | 5 refills | Status: DC

## 2019-07-21 MED ORDER — METFORMIN HCL 1000 MG PO TABS
1000.0000 mg | ORAL_TABLET | Freq: Two times a day (BID) | ORAL | 3 refills | Status: DC
Start: 2019-07-21 — End: 2021-04-11

## 2019-07-21 MED ORDER — GLUCOSE METER TEST VI STRP: ORAL_STRIP | 12 refills | Status: AC

## 2019-07-21 MED ORDER — GLIPIZIDE ER 10 MG PO TB24: 10.0000 mg | ORAL_TABLET | Freq: Every day | ORAL | 3 refills | Status: DC

## 2019-07-21 NOTE — Progress Notes (Signed)
Subjective:    Patient ID: Jimmy Collins, male    DOB: January 19, 1961, 59 y.o.   MRN: [id number]  HPI  04/19/18 Patient states that over the last several weeks, at least 3 weeks, he has developed chest pain with activity.  He states if he walks up several flights of steps he will develop a pressure-like pain in the center of his chest that will radiate down his left arm.  He will also develop shortness of breath.  If he stops and rests the pain goes away quickly.  He denies any chest pain or chest pressure at rest but he does report worsening dyspnea on exertion.  EKG today in clinic shows normal sinus rhythm with a right bundle branch block and nonspecific ST segment changes in the lateral leads.  There is no overt sign of ischemia or infarction.  Patient denies any symptoms of unstable angina.  He is chest pain-free at the present time.  He is compliant with Crestor 20 mg a day.  However he is not taking anything for his blood pressure.  He is not taking aspirin.  He continues to smoke.  At that time, my plan was: Patient has new onset stable angina.  Therefore I want to arrange cardiology consultation as soon as possible for most likely catheterization and evaluation.  Meanwhile I want the patient start aspirin 81 mg a day.  Continue Crestor 20 mg a day.  Add Toprol-XL 25 mg a day due to his elevated blood pressure as well as for cardioprotective effects.  Strongly, strongly, strongly encourage smoking cessation.  Gave the patient a prescription for nitroglycerin.  Gave him instructions on how to take the nitroglycerin and when to take the nitroglycerin.  He is to call 911 immediately if he develops chest pain at rest.  He is to call 911 if he develops exertional chest pain that does not resolve with rest and/or nitroglycerin.  Otherwise will arrange urgent outpatient evaluation by cardiology.  Strongly advised the patient to avoid any strenuous activity until evaluated further.  05/16/18 Was admitted to the  hospital and had CABG: Admit date: 05/02/2018 Discharge date: 05/10/2018  Admission Diagnoses:     Patient Active Problem List   Diagnosis Date Noted  . CAD (coronary artery disease) 05/02/2018  . Essential hypertension 04/30/2018  . Family history of heart disease 04/30/2018  . Unstable angina (HCC) 04/30/2018  . Dyslipidemia   . Smoker   . Mammalian Meat Allergy   . GERD (gastroesophageal reflux disease)   . GERD 04/28/2008  . NEPHROLITHIASIS, HX OF 04/28/2008    Discharge Diagnoses:  Active Problems:   Unstable angina (HCC)   CAD (coronary artery disease)   S/P CABG x 2   Discharged Condition: good  HPI:   59 year old obese nondiabetic smoker presents with symptoms of unstable angina, mainly exertional. He was evaluated by [name] who recommended cardiac catheterization which was performed today. This shows a 95% distal left main stenosis. There is a moderate proximal 70 to 80% LAD stenosis. The RCA is dominant and without significant disease. LV ejection fraction is normal. LVEDP is 14. The patient is currently stable in the Cath Lab holding area without chest pain in sinus rhythm and with normal blood pressure. Surgical coronary revascularization is recommended. Chest x-ray, pre-CABG Dopplers, and blood work is pending.  Hospital Course:   On 05/03/2018 Jimmy Collins underwent an urgent coronary artery bypass grafting x2 by [name].  He tolerated procedure well was transferred to  the surgical ICU for continued care.  He was extubated timely manner.  Postop day 1 he remained hemodynamically stable on low-dose norepinephrine and dopamine.  His renal function remained stable.  He had minimal chest tube output.  Later that day he was extubated and was awake and alert.  He does have a longstanding history of smoking.  Postop day 2 we discontinued his arterial line and his chest tubes.  We continue diuresis for mild volume overload.  He remains on Levemir and  rarely needs sliding scale insulin.  He was not a diabetic preoperatively and his hemoglobin A1c was 6.0.  He will need lots of education on diet modification but hopefully will not need insulin for home.  He remained in normal sinus rhythm and no longer was needing any drips at this time.  He remained on 3 L nasal cannula with good oxygen saturation.  He continued to utilize his incentive spirometer and a flutter valve was added.  We initiated Mucinex for assistance with secretions.  He was then stable to transfer to the telemetry floor for continued care.  On the floor, he was weaned down to 1 L nasal cannula.  His chest x-ray remained stable.  He did remain in normal sinus rhythm therefore we discontinued his epicardial pacing wires.  We continue to encourage pulmonary toilet for weaning of oxygen and respiratory status.  Today, he is tolerating room air, his incisions are healing well, he is ambulating with limited assistance, and he is ready for discharge home.  05/16/18 Patient is doing remarkably well.  Surgical wound on his chest is healing nicely.  There is no evidence of cellulitis or induration or erythema.  There is some bruising on the medial surface of his right lower leg and left lower leg due to vein harvesting however there is no evidence of DVT.  There is no pitting edema in his legs.  There is no evidence of secondary cellulitis.  He is still taking Lasix and potassium.  His weight is stable.  He reports polyuria.  His lungs are completely clear to auscultation bilaterally.  There is no evidence of pulmonary edema or fluid overload on exam.  I question if he still needs the diuretic.  He denies any orthopnea or paroxysmal nocturnal dyspnea.  He is using Percocet occasionally for pain primarily due to the surgery.  He denies any pleurisy or evidence of pulmonary embolism.  He remains free from smoking.  I congratulated the patient on this.  His wife is working on smoking cessation as well.  He was  found to be a prediabetic with a hemoglobin A1c of 6.0.  We discussed low carbohydrate diet today.  At that time, my plan was: Spent 25 minutes today with the patient reviewing his medication list and hospital records.  Encouraged him to stay on aspirin metoprolol and Crestor for secondary prevention of cardiovascular disease.  Strongly encouraged him to continue to remain free from smoking.  I do believe the patient can discontinue Lasix and potassium today.  Encouraged the patient to monitor his weight on a daily basis and if he sees significant weight gain or swelling we may need to resume his Lasix.  We discussed low carbohydrate diet.  Check CBC and CMP today to monitor his perioperative leukocytosis.  Recheck fasting lab work in 3 months  07/18/19 Patient has not had his lab work checked since October 2020.  He is currently on Metformin however he is not checking his sugars.  He  presents today complaining of muscle cramps in his legs at night.  He will wake up with cramps in his calves and in his hands occasionally.  He also reports polyuria.  He states that he is having to urinate every hour.  He had urinating strong stream.  He denies any dysuria, urgency, or frequency.  He also reports polydipsia.  He reports a dry mouth and is unable to get enough to drink.  Since I last saw the patient in October, his weight is dropped almost 40 pounds!.  He does occasionally also have blurry vision.  At that time, my plan was: I suspect that the patient likely is having cramps and dry mouth due to dehydration and electrolyte disturbances.  My problem suspect will be uncontrolled diabetes with hyperglycemia causing polyuria and leading to dehydration.  I encouraged the patient to drink any water in G0 over the weekend while we obtain lab studies to evaluate further.  I will check a CBC, CMP, TSH, and hemoglobin A1c.  I anticipate that his A1c will be extremely high.  We will likely need to start additional medication  immediately to help better control his blood sugar to stop the polyuria and therefore improve muscle cramps.  07/21/19 Patient's blood sugar returned at greater than 700!Marland Kitchen  Over the weekend he was recommended to go to the hospital due to severe hyperglycemia and dehydration.  There he received NovoLog as well as IV fluids and his blood sugars came down into the 200-300 range allow him to be discharged home.  He is not checking his blood sugar.  His dry mouth has improved however he continues to experience polyuria and polydipsia.  He is taking Metformin 500 mg twice a day.  His hemoglobin A1c was greater than 14 suggesting longstanding out-of-control hyperglycemia.  We discussed starting insulin today however the patient has no insurance and insulin would be extremely expensive not to mention having to buy the test strips and the glucometer out-of-pocket.  Therefore he would like to try oral medication if at all possible   Past Medical History:  Diagnosis Date  . 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  Alpha-gal allergy  . Diabetes mellitus without complication (HCC)   . Dyslipidemia   . GERD (gastroesophageal reflux disease)   . Hypertension   . Kidney stones   . [facility] spotted fever   . Smoker    Past Surgical History:  Procedure Laterality Date  . CORONARY ARTERY BYPASS GRAFT N/A 05/03/2018   Procedure: CORONARY ARTERY BYPASS GRAFTING (CABG), ON PUMP, TIMES TWO, USING LEFT INTERNAL MAMMARY ARTERY AND ENDOSCOPICALLY HARVESTED LEFT GREATER SAPHENOUS VEIN;  Surgeon: Kerin Perna, MD;  Location: [facility] OR;  Service: Open Heart Surgery;  Laterality: N/A;  . KIDNEY STONE SURGERY     bilateral  . KNEE SURGERY     left  . LEFT HEART CATH AND CORONARY ANGIOGRAPHY N/A 05/02/2018   Procedure: LEFT HEART CATH AND CORONARY ANGIOGRAPHY;  Surgeon: Runell Gess, MD;  Location: [facility] INVASIVE CV LAB;  Service: Cardiovascular;  Laterality: N/A;  . TEE  WITHOUT CARDIOVERSION N/A 05/03/2018   Procedure: TRANSESOPHAGEAL ECHOCARDIOGRAM (TEE);  Surgeon: Donata Clay, Theron Arista, MD;  Location: [facility] OR;  Service: Open Heart Surgery;  Laterality: N/A;   Current Outpatient Medications on File Prior to Visit  Medication Sig Dispense Refill  . aspirin EC 325 MG EC tablet Take 1 tablet (325 mg total) by mouth daily. 30 tablet  0  . diazepam (VALIUM) 5 MG tablet Take 1 tablet (5 mg total) by mouth at bedtime. (Patient taking differently: Take 5 mg by mouth at bedtime as needed (sleep). ) 30 tablet 1  . EPINEPHrine (EPIPEN 2-PAK) 0.3 mg/0.3 mL IJ SOAJ injection Inject 0.3 mLs (0.3 mg total) into the muscle once as needed (for severe allergic reaction). 1 Device 1  . lansoprazole (PREVACID) 30 MG capsule Take 30 mg by mouth daily.      . metoprolol succinate (TOPROL-XL) 25 MG 24 hr tablet TAKE ONE TABLET BY MOUTH DAILY (Patient taking differently: Take 25 mg by mouth daily. ) 30 tablet 5  . nitroGLYCERIN (NITROSTAT) 0.4 MG SL tablet Place 1 tablet (0.4 mg total) under the tongue every 5 (five) minutes as needed for chest pain. 50 tablet 3  . rosuvastatin (CRESTOR) 20 MG tablet TAKE ONE TABLET BY MOUTH DAILY (Patient taking differently: Take 20 mg by mouth daily. ) 90 tablet 3   No current facility-administered medications on file prior to visit.   Allergies  Allergen Reactions  . Other Hives, Itching and Swelling    Mammalian Meat Allergy  . Heparin Other (See Comments)    Pt did not remember rxn  . Shellfish Allergy Hives and Rash   Social History   Socioeconomic History  . Marital status: Married    Spouse name: Not on file  . Number of children: Not on file  . Years of education: Not on file  . Highest education level: Not on file  Occupational History  . Not on file  Tobacco Use  . Smoking status: Former Smoker    Packs/day: 1.00  . Smokeless tobacco: Never Used  Substance and Sexual Activity  . Alcohol use: No  . Drug use: Never  . Sexual  activity: Not on file  Other Topics Concern  . Not on file  Social History Narrative  . Not on file   Social Determinants of Health   Financial Resource Strain:   . Difficulty of Paying Living Expenses:   Food Insecurity:   . Worried About Programme researcher, broadcasting/film/video in the Last Year:   . Barista in the Last Year:   Transportation Needs:   . Freight forwarder (Medical):   Marland Kitchen Lack of Transportation (Non-Medical):   Physical Activity:   . Days of Exercise per Week:   . Minutes of Exercise per Session:   Stress:   . Feeling of Stress :   Social Connections:   . Frequency of Communication with Friends and Family:   . Frequency of Social Gatherings with Friends and Family:   . Attends Religious Services:   . Active Member of Clubs or Organizations:   . Attends Banker Meetings:   Marland Kitchen Marital Status:   Intimate Partner Violence:   . Fear of Current or Ex-Partner:   . Emotionally Abused:   Marland Kitchen Physically Abused:   . Sexually Abused:       Review of Systems  All other systems reviewed and are negative.      Objective:   Physical Exam  Constitutional: He is oriented to person, place, and time. He appears well-developed and well-nourished. No distress.  HENT:  Head: Normocephalic and atraumatic.  Right Ear: External ear normal.  Left Ear: External ear normal.  Nose: Nose normal.  Mouth/Throat: Oropharynx is clear and moist. No oropharyngeal exudate.  Eyes: Pupils are equal, round, and reactive to light. Conjunctivae and EOM are normal. Right  eye exhibits no discharge. Left eye exhibits no discharge. No scleral icterus.  Neck: No JVD present. No tracheal deviation present. No thyromegaly present.  Cardiovascular: Normal rate, regular rhythm, normal heart sounds and intact distal pulses. Exam reveals no gallop and no friction rub.  No murmur heard. Pulmonary/Chest: Effort normal and breath sounds normal. No stridor. No respiratory distress. He has no wheezes.  He has no rales.  Abdominal: Soft. Bowel sounds are normal. He exhibits no distension and no mass. There is no abdominal tenderness. There is no rebound and no guarding.  Musculoskeletal:        General: No edema.     Cervical back: Neck supple.  Lymphadenopathy:    He has no cervical adenopathy.  Neurological: He is alert and oriented to person, place, and time. He has normal reflexes. No cranial nerve deficit. He exhibits normal muscle tone. Coordination normal.  Skin: No rash noted. He is not diaphoretic.  Psychiatric: He has a normal mood and affect. His behavior is normal. Judgment and thought content normal.  Vitals reviewed.      Uncontrolled diabetes mellitus with circulatory complication, without long-term current use of insulin (HCC)  S/P CABG x 2  Essential hypertension  Start glipizide extended release 10 mg every morning.  Increase Metformin to 1000 mg twice daily.  Add Actos 30 mg a day.  Check fasting blood sugars and 2-hour postprandial sugars daily and report the values to me weekly.  Over the next 3 weeks if sugars are not improving, we will have to abandon oral medication and try the patient on 70/30 insulin

## 2019-07-22 ENCOUNTER — Telehealth: Payer: Self-pay

## 2019-08-06 ENCOUNTER — Telehealth: Payer: Self-pay | Admitting: Family Medicine

## 2019-08-06 NOTE — Telephone Encounter (Signed)
Patient's wife called stating that patient's blood sugars have been ranging from 67-196. States that patient has been getting a little shaky throughout the day. She is going to fax over a list with all of his recent readings in case changes need to be made to his medication. Also patient recently had an xray of his knee at [facility] for his disability claim and the radiologist informed them that it looked like he had metal in his knee. However patient has never had any surgery on the knee. Patient is going to drop off disc for [place] to review of imaging.

## 2019-08-07 NOTE — Telephone Encounter (Signed)
Left message return call

## 2019-08-07 NOTE — Telephone Encounter (Signed)
Decrease glipizide to 5 mg a day as low sugars are likely making him shaky.

## 2019-08-07 NOTE — Telephone Encounter (Signed)
Spoke with patient and advised him that Glipizide is now decreased to 5 mg. Patient verbalized understanding.

## 2019-08-12 ENCOUNTER — Telehealth: Payer: Self-pay | Admitting: Family Medicine

## 2019-08-12 NOTE — Telephone Encounter (Signed)
Stop glipizide totally.  Stay on metformin and actos.  Sugars look really good, a little low at times.

## 2019-08-12 NOTE — Telephone Encounter (Signed)
Patient dropped off Glucose readings. Please see below  May 14th-  Morning-196 Evening- 100  May 15th- Morning- 150 Evening-143   May 16th- Morning 135 Evening- 115   May 17th-Morning-126  Evening-98  May 18th- Morning-137 [id number]  May 19th- [id number]-327 Evening-75   May 20th- morning-158 Evening-118  May 21st- Morning- 125 Evening-79  May 22nd- Morning-111 May -67   May 23rd- Morning -100 Evening- 71  May 24th-  Morning 105 Evening-148  May 25th-Morning-129 Morning-71  May 26th- Morning-114   Please advise?

## 2019-08-13 NOTE — Telephone Encounter (Signed)
Left message return call

## 2019-08-13 NOTE — Telephone Encounter (Signed)
Pt's wife called back. Verbalizes understanding.

## 2019-08-25 ENCOUNTER — Telehealth: Payer: Self-pay | Admitting: Cardiovascular Disease

## 2019-08-25 NOTE — Telephone Encounter (Signed)
Spoke to wife . Okay to attend appointment 10/03/19.  wear mask and bring medication or list  Verbalized understanding

## 2019-08-25 NOTE — Telephone Encounter (Signed)
Lupita Leash is calling requesting to attend Jimmy Collins's upcoming appointment scheduled for 10/03/19 due to him having bad memory and forgetting to tell the doctor everything. Please advise.

## 2019-09-04 ENCOUNTER — Telehealth: Payer: Self-pay | Admitting: Family Medicine

## 2019-09-04 NOTE — Telephone Encounter (Signed)
Spoke with Jimmy Collins wife she wanted to know if he still needs to test twice daily, due to the fact he has no insurance

## 2019-09-04 NOTE — Telephone Encounter (Signed)
CB# [phone] Pt would like to know if still need to take his blood sugar due to the stripes cost due no insurance.

## 2019-09-16 ENCOUNTER — Other Ambulatory Visit: Payer: Self-pay

## 2019-09-16 ENCOUNTER — Telehealth: Payer: Self-pay | Admitting: Family Medicine

## 2019-09-16 NOTE — Telephone Encounter (Signed)
Pt does not have refill for lancets that he is asking for.

## 2019-09-16 NOTE — Telephone Encounter (Signed)
CB#[phone] Pt need more Lancets to be fax over to the pharmacy [facility] [address] [place] - [address] only  having  enough for today

## 2019-10-03 ENCOUNTER — Other Ambulatory Visit: Payer: Self-pay

## 2019-10-03 ENCOUNTER — Encounter: Payer: Self-pay | Admitting: Cardiovascular Disease

## 2019-10-03 ENCOUNTER — Ambulatory Visit (INDEPENDENT_AMBULATORY_CARE_PROVIDER_SITE_OTHER): Payer: Self-pay | Admitting: Cardiovascular Disease

## 2019-10-03 VITALS — BP 110/82 | HR 67 | Ht 65.0 in | Wt 208.4 lb

## 2019-10-03 DIAGNOSIS — Z951 Presence of aortocoronary bypass graft: Secondary | ICD-10-CM

## 2019-10-03 DIAGNOSIS — I10 Essential (primary) hypertension: Secondary | ICD-10-CM

## 2019-10-03 DIAGNOSIS — I2510 Atherosclerotic heart disease of native coronary artery without angina pectoris: Secondary | ICD-10-CM

## 2019-10-03 NOTE — Assessment & Plan Note (Signed)
History of essential hypertension blood pressure measured today 110/82.  He is on metoprolol .

## 2019-10-03 NOTE — Patient Instructions (Signed)
Medication Instructions:  NO CHANGE *If you need a refill on your cardiac medications before your next appointment, please call your pharmacy*   Lab Work: If you have labs (blood work) drawn today and your tests are completely normal, you will receive your results only by: . MyChart Message (if you have MyChart) OR . A paper copy in the mail If you have any lab test that is abnormal or we need to change your treatment, we will call you to review the results.   Follow-Up: At [facility], you and your health needs are our priority.  As part of our continuing mission to provide you with exceptional heart care, we have created designated Provider Care Teams.  These Care Teams include your primary Cardiologist (physician) and Advanced Practice Providers (APPs -  Physician Assistants and Nurse Practitioners) who all work together to provide you with the care you need, when you need it.  We recommend signing up for the patient portal called "MyChart".  Sign up information is provided on this After Visit Summary.  MyChart is used to connect with patients for Virtual Visits (Telemedicine).  Patients are able to view lab/test results, encounter notes, upcoming appointments, etc.  Non-urgent messages can be sent to your provider as well.   To learn more about what you can do with MyChart, go to [email or web address].    Your next appointment:    Your physician wants you to follow-up in: 6 MONTHS WITH KATHRYN LAWRENCE DNP You will receive a reminder letter in the mail two months in advance. If you don't receive a letter, please call our office to schedule the follow-up appointment.   Your physician wants you to follow-up in: ONE YEAR WITH DR BERRY You will receive a reminder letter in the mail two months in advance. If you don't receive a letter, please call our office to schedule the follow-up appointment.  

## 2019-10-03 NOTE — Progress Notes (Signed)
10/03/2019 Jimmy Collins   1960/04/15  [id number]  Primary Physician Tanya Nones Priscille Heidelberg, MD Primary Cardiologist: Runell Gess MD Nicholes [place], [place]  HPI:  Jimmy Collins is a 59 y.o.  moderately overweight married Caucasian male father of 1 child referred by Dr. Tanya Nones for cardiovascular evaluation because of new onset effort angina. He works doing maintenance.I last spoke to him for a virtual telemedicinephone visit on 10/01/2018.  His risk factors include 80-pack-year history tobacco abuse having cut back from 2 packs a day to 1/2 pack a day 2 weeks ago at the request of his PCP. His risk factors otherwise include treated hypertension, hyperlipidemia and family history with a father who had a myocardial infarction and a brother who is had stents. His brother is my patient and I placed the stents. He also has a hiatal hernia with GERD. 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 He has had chest pain for 4 weeks which is typically exertional with pain down both arms and shortness of breath.  I performed cardiac catheterization on him 05/02/2018 revealing high-grade distal left main disease and segmental proximal LAD disease with right to left collaterals and normal LV function. The following day he underwent CABG x2 by [name] with a LIMA to his LAD and a vein to an obtuse marginal branch. Was discharged home 1 week later. He feels clinically improved.  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  He does get out and walk daily.  He denies chest pain or shortness of breath.  He did mention that he stopped smoking on the day of his surgery.  He had an excellent lipid profile performed 09/20/2018.   Current Meds  Medication Sig  . aspirin EC 325 MG EC tablet Take 1 tablet (325 mg total) by mouth daily.  . diazepam (VALIUM) 5 MG tablet Take 1 tablet (5 mg total) by mouth at bedtime. (Patient taking differently: Take  5 mg by mouth at bedtime as needed (sleep). )  . EPINEPHrine (EPIPEN 2-PAK) 0.3 mg/0.3 mL IJ SOAJ injection Inject 0.3 mLs (0.3 mg total) into the muscle once as needed (for severe allergic reaction).  Marland Kitchen glucose blood (GLUCOSE METER TEST) test strip Use to check fasting blood sugar qam  . lansoprazole (PREVACID) 30 MG capsule Take 30 mg by mouth daily.    . metFORMIN (GLUCOPHAGE) 1000 MG tablet Take 1 tablet (1,000 mg total) by mouth 2 (two) times daily with a meal.  . metoprolol succinate (TOPROL-XL) 25 MG 24 hr tablet TAKE ONE TABLET BY MOUTH DAILY (Patient taking differently: Take 25 mg by mouth daily. )  . nitroGLYCERIN (NITROSTAT) 0.4 MG SL tablet Place 1 tablet (0.4 mg total) under the tongue every 5 (five) minutes as needed for chest pain.  . pioglitazone (ACTOS) 30 MG tablet Take 1 tablet (30 mg total) by mouth daily.  . rosuvastatin (CRESTOR) 20 MG tablet TAKE ONE TABLET BY MOUTH DAILY (Patient taking differently: Take 20 mg by mouth daily. )  . [DISCONTINUED] glipiZIDE (GLUCOTROL XL) 10 MG 24 hr tablet Take 1 tablet (10 mg total) by mouth daily with breakfast.     Allergies  Allergen Reactions  . Other Hives, Itching and Swelling    Mammalian Meat Allergy  . Heparin Other (See Comments)    Pt did not remember rxn  . Shellfish Allergy Hives and Rash    Social History   Socioeconomic  History  . Marital status: Married    Spouse name: Not on file  . Number of children: Not on file  . Years of education: Not on file  . Highest education level: Not on file  Occupational History  . Not on file  Tobacco Use  . Smoking status: Former Smoker    Packs/day: 1.00  . Smokeless tobacco: Never Used  Substance and Sexual Activity  . Alcohol use: No  . Drug use: Never  . Sexual activity: Not on file  Other Topics Concern  . Not on file  Social History Narrative  . Not on file   Social Determinants of Health   Financial Resource Strain:   . Difficulty of Paying Living Expenses:    Food Insecurity:   . Worried About Programme researcher, broadcasting/film/video in the Last Year:   . Barista in the Last Year:   Transportation Needs:   . Freight forwarder (Medical):   Marland Kitchen Lack of Transportation (Non-Medical):   Physical Activity:   . Days of Exercise per Week:   . Minutes of Exercise per Session:   Stress:   . Feeling of Stress :   Social Connections:   . Frequency of Communication with Friends and Family:   . Frequency of Social Gatherings with Friends and Family:   . Attends Religious Services:   . Active Member of Clubs or Organizations:   . Attends Banker Meetings:   Marland Kitchen Marital Status:   Intimate Partner Violence:   . Fear of Current or Ex-Partner:   . Emotionally Abused:   Marland Kitchen Physically Abused:   . Sexually Abused:      Review of Systems: General: negative for chills, fever, night sweats or weight changes.  Cardiovascular: negative for chest pain, dyspnea on exertion, edema, orthopnea, palpitations, paroxysmal nocturnal dyspnea or shortness of breath Dermatological: negative for rash Respiratory: negative for cough or wheezing Urologic: negative for hematuria Abdominal: negative for nausea, vomiting, diarrhea, bright red blood per rectum, melena, or hematemesis Neurologic: negative for visual changes, syncope, or dizziness All other systems reviewed and are otherwise negative except as noted above.    Blood pressure 110/82, pulse 67, height 5\' 5"  (1.651 m), weight (!) 208 lb 6.4 oz (94.5 kg).  General appearance: no distress Neck: no adenopathy, no carotid bruit, no JVD, supple, symmetrical, trachea midline and thyroid not enlarged, symmetric, no tenderness/mass/nodules Lungs: clear to auscultation bilaterally Heart: regular rate and rhythm, S1, S2 normal, no murmur, click, rub or gallop Extremities: extremities normal, atraumatic, no cyanosis or edema Pulses: 2+ and symmetric Skin: Skin color, texture, turgor normal. No rashes or  lesions Neurologic: Alert and oriented X 3, normal strength and tone. Normal symmetric reflexes. Normal coordination and gait  EKG sinus rhythm at 67 with incomplete right bundle branch block.  I personally reviewed this EKG.  ASSESSMENT AND PLAN:   Dyslipidemia History of dyslipidemia on statin therapy with lipid profile performed 12/24/2018 revealed a total cholesterol 132, LDL of 57 and HDL 36.  Smoker History of remote tobacco use having stopped the day of his bypass surgery.  Essential hypertension History of essential hypertension blood pressure measured today 110/82.  He is on metoprolol .  S/P CABG x 2 History of CAD status post coronary artery bypass grafting x2 by Dr. 12/26/2018 after cath showed left main disease with a LIMA to the LAD and a vein to the obtuse marginal branch 05/02/2018.  He has had no recurrent angina since that  time.      Runell Gess MD [facility], [facility] 10/03/2019 4:46 PM

## 2019-10-03 NOTE — Assessment & Plan Note (Signed)
History of CAD status post coronary artery bypass grafting x2 by [name] after cath showed left main disease with a LIMA to the LAD and a vein to the obtuse marginal branch 05/02/2018.  He has had no recurrent angina since that time.

## 2019-10-03 NOTE — Assessment & Plan Note (Signed)
History of dyslipidemia on statin therapy with lipid profile performed 12/24/2018 revealed a total cholesterol 132, LDL of 57 and HDL 36.

## 2019-10-03 NOTE — Assessment & Plan Note (Signed)
History of remote tobacco use having stopped the day of his bypass surgery.

## 2019-11-20 ENCOUNTER — Telehealth: Payer: Self-pay

## 2019-11-20 MED ORDER — ROSUVASTATIN CALCIUM 20 MG PO TABS: 20.0000 mg | ORAL_TABLET | Freq: Every day | ORAL | 3 refills | Status: DC

## 2019-11-20 NOTE — Telephone Encounter (Signed)
Pt wife called Pt needs new rx refills sent to his new pharmacy [facility] [address]

## 2019-12-08 ENCOUNTER — Other Ambulatory Visit: Payer: Self-pay | Admitting: Family Medicine

## 2020-01-05 ENCOUNTER — Other Ambulatory Visit: Payer: Self-pay

## 2020-01-05 ENCOUNTER — Other Ambulatory Visit: Payer: Self-pay | Admitting: Family Medicine

## 2020-01-05 MED ORDER — PIOGLITAZONE HCL 30 MG PO TABS: 30.0000 mg | ORAL_TABLET | Freq: Every day | ORAL | 2 refills | Status: DC

## 2020-01-05 NOTE — Telephone Encounter (Signed)
CB# [phone] Refill Pioglitazone 30 mg           [id number] - , [place] - [address] [facility] [address]

## 2020-01-08 ENCOUNTER — Telehealth: Payer: Self-pay

## 2020-01-08 NOTE — Telephone Encounter (Signed)
Jimmy Collins called stating that she missed a call. In chart looking for documentation of missed call .

## 2020-01-13 MED ORDER — PIOGLITAZONE HCL 30 MG PO TABS: 30.0000 mg | ORAL_TABLET | Freq: Every day | ORAL | 2 refills | Status: DC

## 2020-01-13 NOTE — Telephone Encounter (Signed)
Refill Pioglitazone 30 mg

## 2020-01-13 NOTE — Telephone Encounter (Signed)
Prescription sent to pharmacy.

## 2020-01-15 ENCOUNTER — Telehealth: Payer: Self-pay | Admitting: Cardiology

## 2020-02-10 ENCOUNTER — Encounter: Payer: Self-pay | Admitting: Cardiology

## 2020-02-10 ENCOUNTER — Telehealth (INDEPENDENT_AMBULATORY_CARE_PROVIDER_SITE_OTHER): Payer: Self-pay | Admitting: Cardiology

## 2020-02-10 VITALS — Ht 65.0 in | Wt 200.0 lb

## 2020-02-10 DIAGNOSIS — R5383 Other fatigue: Secondary | ICD-10-CM

## 2020-02-10 DIAGNOSIS — E119 Type 2 diabetes mellitus without complications: Secondary | ICD-10-CM | POA: Insufficient documentation

## 2020-02-10 DIAGNOSIS — E785 Hyperlipidemia, unspecified: Secondary | ICD-10-CM

## 2020-02-10 DIAGNOSIS — Z951 Presence of aortocoronary bypass graft: Secondary | ICD-10-CM

## 2020-02-10 NOTE — Progress Notes (Signed)
Virtual Visit via Telephone Note   This visit type was conducted due to national recommendations for restrictions regarding the COVID-19 Pandemic (e.g. social distancing) in an effort to limit this patient's exposure and mitigate transmission in our community.  Due to his co-morbid illnesses, this patient is at least at moderate risk for complications without adequate follow up.  This format is felt to be most appropriate for this patient at this time.  The patient did not have access to video technology/had technical difficulties with video requiring transitioning to audio format only (telephone).  All issues noted in this document were discussed and addressed.  No physical exam could be performed with this format.  Please refer to the patient's chart for his  consent to telehealth for [facility].    Date:  02/10/2020   ID:  Becky Augusta, DOB [birth year], MRN [id number] The patient was identified using 2 identifiers.  Patient Location: Home Provider Location: Home Office  PCP:  Donita Brooks, MD  Cardiologist:  Dr Allyson Sabal Electrophysiologist:  None   Evaluation Performed:  Follow-Up Visit  Chief Complaint:  Fatigue with exertion  History of Present Illness:    Jimmy Collins is a 59 y.o. male with a history of coronary disease.  He had CABG times 04/14/2018 with an LIMA to LAD and SVG to the OM.  Other medical issues include non-insulin-dependent diabetes, treated dyslipidemia, and a past history of heavy smoking.  He quit smoking the day of his CABG and has not smoked since.  The patient was contacted today for routine follow-up.  He saw Dr. Gery Pray in July 2021.  The patient tells me on the phone that he is doing "okay".  On further interview he admits that he has fatigue with almost any exertion.  He tells me he is unable to mow his lawn or do any housework without frequent rest.  He denies any chest pain.  He has occasional palpitations but no sustained tachycardia.  The  patient does not have symptoms concerning for COVID-19 infection (fever, chills, cough, or new shortness of breath).    Past Medical History:  Diagnosis Date  . 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  Alpha-gal allergy  . Diabetes mellitus without complication (HCC)   . Dyslipidemia   . GERD (gastroesophageal reflux disease)   . Hypertension   . Kidney stones   . [facility] spotted fever   . Smoker    Past Surgical History:  Procedure Laterality Date  . CORONARY ARTERY BYPASS GRAFT N/A 05/03/2018   Procedure: CORONARY ARTERY BYPASS GRAFTING (CABG), ON PUMP, TIMES TWO, USING LEFT INTERNAL MAMMARY ARTERY AND ENDOSCOPICALLY HARVESTED LEFT GREATER SAPHENOUS VEIN;  Surgeon: Kerin Perna, MD;  Location: [facility] OR;  Service: Open Heart Surgery;  Laterality: N/A;  . KIDNEY STONE SURGERY     bilateral  . KNEE SURGERY     left  . LEFT HEART CATH AND CORONARY ANGIOGRAPHY N/A 05/02/2018   Procedure: LEFT HEART CATH AND CORONARY ANGIOGRAPHY;  Surgeon: Runell Gess, MD;  Location: [facility] INVASIVE CV LAB;  Service: Cardiovascular;  Laterality: N/A;  . TEE WITHOUT CARDIOVERSION N/A 05/03/2018   Procedure: TRANSESOPHAGEAL ECHOCARDIOGRAM (TEE);  Surgeon: Donata Clay, Theron Arista, MD;  Location[facility];  Service: Open Heart Surgery;  Laterality: N/A;     Current Meds  Medication Sig  . aspirin EC 325 MG EC tablet Take 1 tablet (325 mg total) by mouth daily.  Marland Kitchen  EPINEPHrine (EPIPEN 2-PAK) 0.3 mg/0.3 mL IJ SOAJ injection Inject 0.3 mLs (0.3 mg total) into the muscle once as needed (for severe allergic reaction).  Marland Kitchen glucose blood (GLUCOSE METER TEST) test strip Use to check fasting blood sugar qam  . lansoprazole (PREVACID) 30 MG capsule Take 30 mg by mouth daily.    . metFORMIN (GLUCOPHAGE) 1000 MG tablet Take 1 tablet (1,000 mg total) by mouth 2 (two) times daily with a meal.  . metoprolol succinate (TOPROL-XL) 25 MG 24 hr tablet TAKE 1 TABLET BY MOUTH EVERY DAY  .  nitroGLYCERIN (NITROSTAT) 0.4 MG SL tablet Place 1 tablet (0.4 mg total) under the tongue every 5 (five) minutes as needed for chest pain.  . pioglitazone (ACTOS) 30 MG tablet Take 1 tablet (30 mg total) by mouth daily.  . rosuvastatin (CRESTOR) 20 MG tablet Take 1 tablet (20 mg total) by mouth daily.     Allergies:   Other, Heparin, and Shellfish allergy   Social History   Tobacco Use  . Smoking status: Former Smoker    Packs/day: 1.00  . Smokeless tobacco: Never Used  Substance Use Topics  . Alcohol use: No  . Drug use: Never     Family Hx: The patient's family history is not on file.  ROS:   Please see the history of present illness.    No history of melana All other systems reviewed and are negative.   Prior CV studies:   The following studies were reviewed today:  Echo 05/04/2019- IMPRESSIONS    1. The left ventricle has a 2D calculated ejection fraction of 61% by  biplane volume. The cavity size was normal. Left ventricular diastolic  parameters were normal.  2. The right ventricle has normal systolic function. The cavity was  normal. There is no increase in right ventricular wall thickness. Right  ventricular systolic pressure could not be assessed.  3. The mitral valve is normal in structure.  4. The tricuspid valve is normal in structure.  5. The aortic valve is normal in structure.  6. The pulmonic valve was normal in structure.  7. The aortic root and ascending aorta are normal in size and structure.  8. The inferior vena cava was dilated in size with >50% respiratory  variability.   Labs/Other Tests and Data Reviewed:    EKG:  An ECG dated 10/03/2019 was personally reviewed today and demonstrated:  NSR, icomplete RBBB, LAD  Recent Labs: 07/18/2019: TSH 0.96 07/19/2019: ALT 48; BUN 13; Creatinine, Ser 0.80; Hemoglobin 17.5; Platelets 236; Potassium 4.6; Sodium 134   Recent Lipid Panel Lab Results  Component Value Date/Time   CHOL 132 12/24/2018  10:58 AM   TRIG 242 (H) 12/24/2018 10:58 AM   HDL 36 (L) 12/24/2018 10:58 AM   CHOLHDL 3.7 12/24/2018 10:58 AM   CHOLHDL 6.6 (H) 01/28/2018 08:53 AM   LDLCALC 57 12/24/2018 10:58 AM   LDLCALC 144 (H) 01/28/2018 08:53 AM    Wt Readings from Last 3 Encounters:  02/10/20 200 lb (90.7 kg)  10/03/19 (!) 208 lb 6.4 oz (94.5 kg)  07/21/19 198 lb (89.8 kg)     Risk Assessment/Calculations:      Objective:    Vital Signs:  Ht 5\' 5"  (1.651 m)   Wt 200 lb (90.7 kg)   BMI 33.28 kg/m    VITAL SIGNS:  reviewed  ASSESSMENT & PLAN:    Exertional fatigue-unclear etiology.  It does not appear to be related to his medications.  The patient minimizes  his symptoms but I get the sense that they are more bothersome than he is letting on.  CABG- s/p CABG x 14 Apr 2018 for LM and LAD disease, no other significant CAD. EF 60% by echo.  HLD- LDL 57 Oct 2020 on statin Rx  NIDDM- per PCP.  Consider Jardiance  Plan-I suggested the patient come in the office for an EKG and exam.  I'll arrange this with [name].  He knows to contact us in the meantime if his symptoms progress. Decrease aspirin to 81 mg daily.      Shared Decision Making/Informed Consent        COVID-19 Education: The signs and symptoms of COVID-19 were discussed with the patient and how to seek care for testing (follow up with PCP or arrange E-visit).  The importance of social distancing was discussed today.  Time:   Today, I have spent 20 minutes with the patient with telehealth technology discussing the above problems.     Medication Adjustments/Labs and Tests Ordered: Current medicines are reviewed at length with the patient today.  Concerns regarding medicines are outlined above.   Tests Ordered: No orders of the defined types were placed in this encounter.   Medication Changes: No orders of the defined types were placed in this encounter.   Follow Up:  In Person Dr Nevin Bloodgood weeks  Signed, Corine Shelter, PA-C    02/10/2020 10:59 AM    [facility]

## 2020-02-10 NOTE — Patient Instructions (Signed)
Medication Instructions:  DECREASE- Aspirin 81 mg by mouth daily  *If you need a refill on your cardiac medications before your next appointment, please call your pharmacy*   Lab Work: None Ordered   Testing/Procedures: None Ordered   Follow-Up: At [facility], you and your health needs are our priority.  As part of our continuing mission to provide you with exceptional heart care, we have created designated Provider Care Teams.  These Care Teams include your primary Cardiologist (physician) and Advanced Practice Providers (APPs -  Physician Assistants and Nurse Practitioners) who all work together to provide you with the care you need, when you need it.  We recommend signing up for the patient portal called "MyChart".  Sign up information is provided on this After Visit Summary.  MyChart is used to connect with patients for Virtual Visits (Telemedicine).  Patients are able to view lab/test results, encounter notes, upcoming appointments, etc.  Non-urgent messages can be sent to your provider as well.   To learn more about what you can do with MyChart, go to [email or web address].    Your next appointment:   4-6 week(s)  The format for your next appointment:   In Person  Provider:   Nanetta Batty, MD

## 2020-02-23 IMAGING — DX DG CHEST 1V PORT
1 series · 1 of 1 positions shown · non-contrast
Comparison: 09/23/2004 chest radiograph

CLINICAL DATA: 57 y/o  M; shortness of breath and chest pain.

EXAM:
PORTABLE CHEST 1 VIEW

[chest ap]
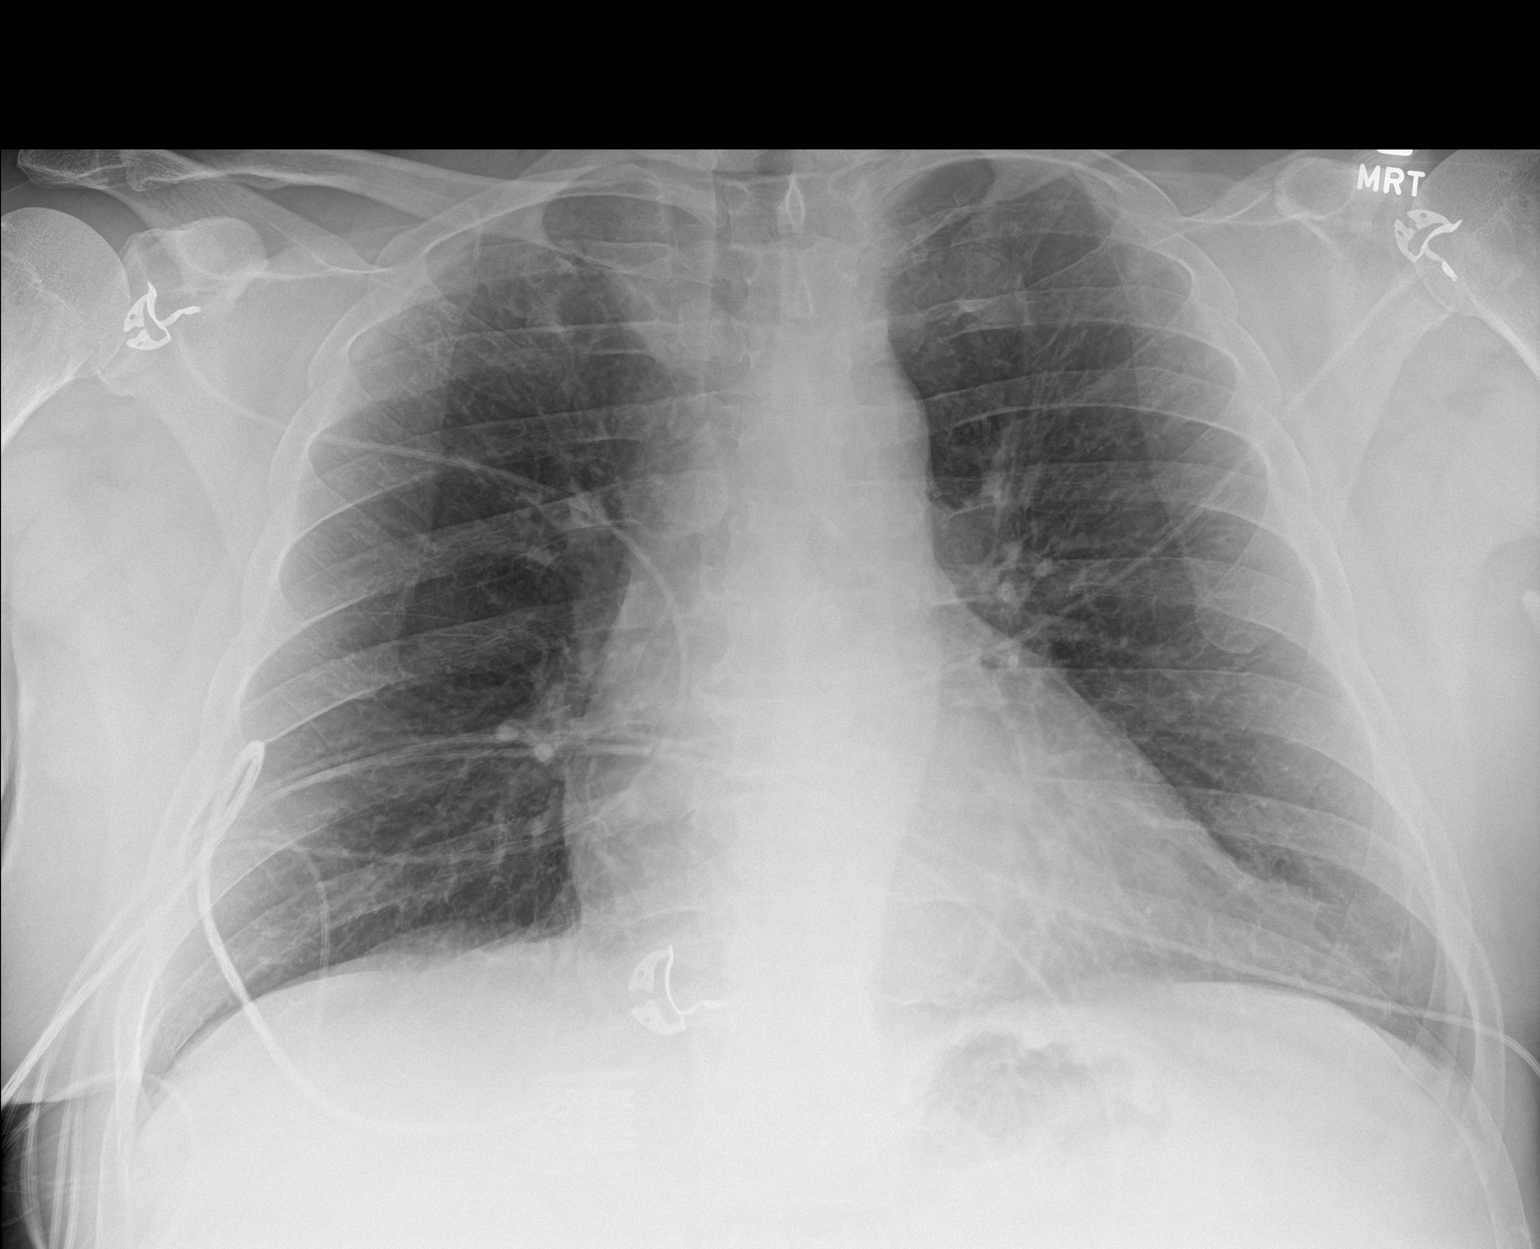

[1 of 1 positions shown; findings below may reference images not displayed]

FINDINGS: Stable heart size and mediastinal contours are within normal limits.
Both lungs are clear. The visualized skeletal structures are
unremarkable.
IMPRESSION: No active disease.

## 2020-02-24 IMAGING — CR DG CHEST 1V PORT
1 series · 1 of 1 positions shown · non-contrast
Comparison: 05/02/2018

CLINICAL DATA: Status post CABG

EXAM:
PORTABLE CHEST 1 VIEW

[AP]
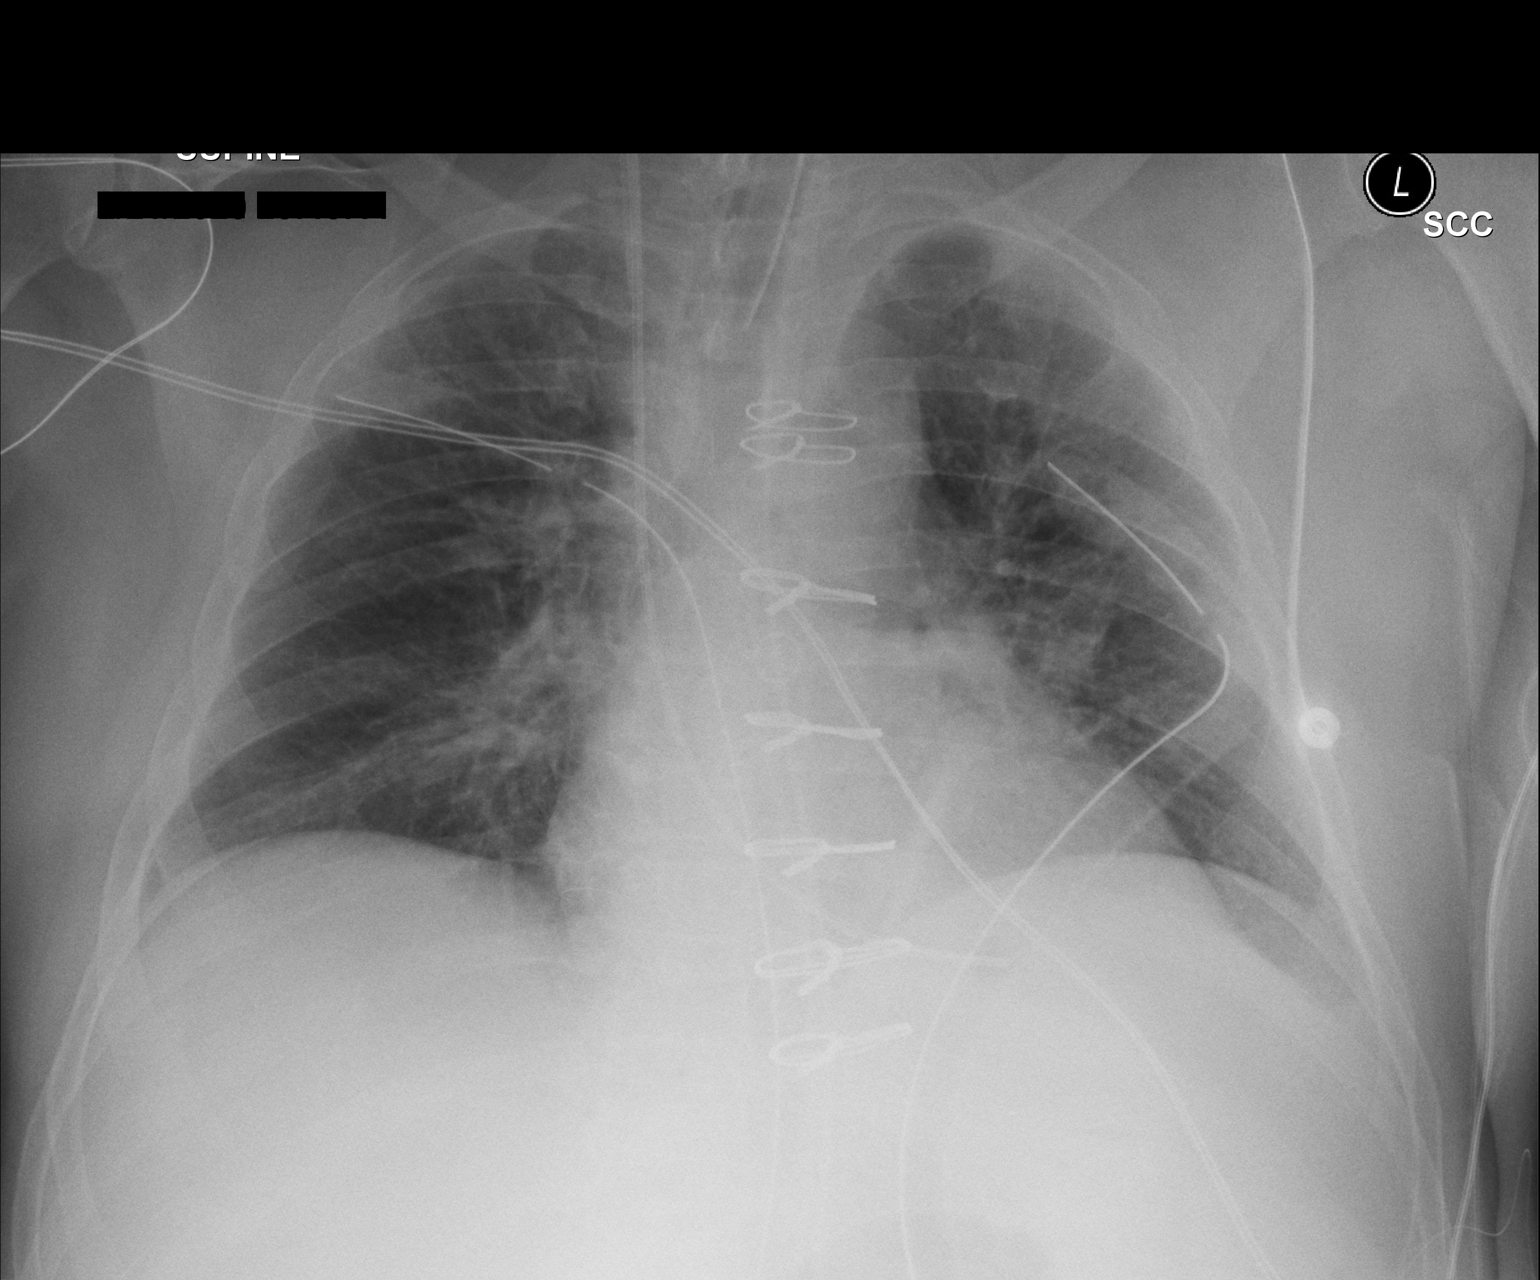

[1 of 1 positions shown; findings below may reference images not displayed]

FINDINGS: Interval intubation, tip of the endotracheal tube is 4.4 cm superior
to the carina. Interval sternotomy. Right IJ catheter tip overlies
the right ventricle. Mediastinal and chest drainage catheters. No
pneumothorax. Minimal atelectasis left base. Normal heart size.
IMPRESSION: 1. Interval postsurgical changes of the mediastinum and placement of
support lines and tubes. The right IJ catheter tip projects over the
right ventricle.
2. Mild subsegmental atelectasis at the left base

## 2020-02-25 IMAGING — DX DG CHEST 1V PORT
1 series · 1 of 1 positions shown · non-contrast
Comparison: 05/03/2018

CLINICAL DATA: CABG

EXAM:
PORTABLE CHEST 1 VIEW

[chest]
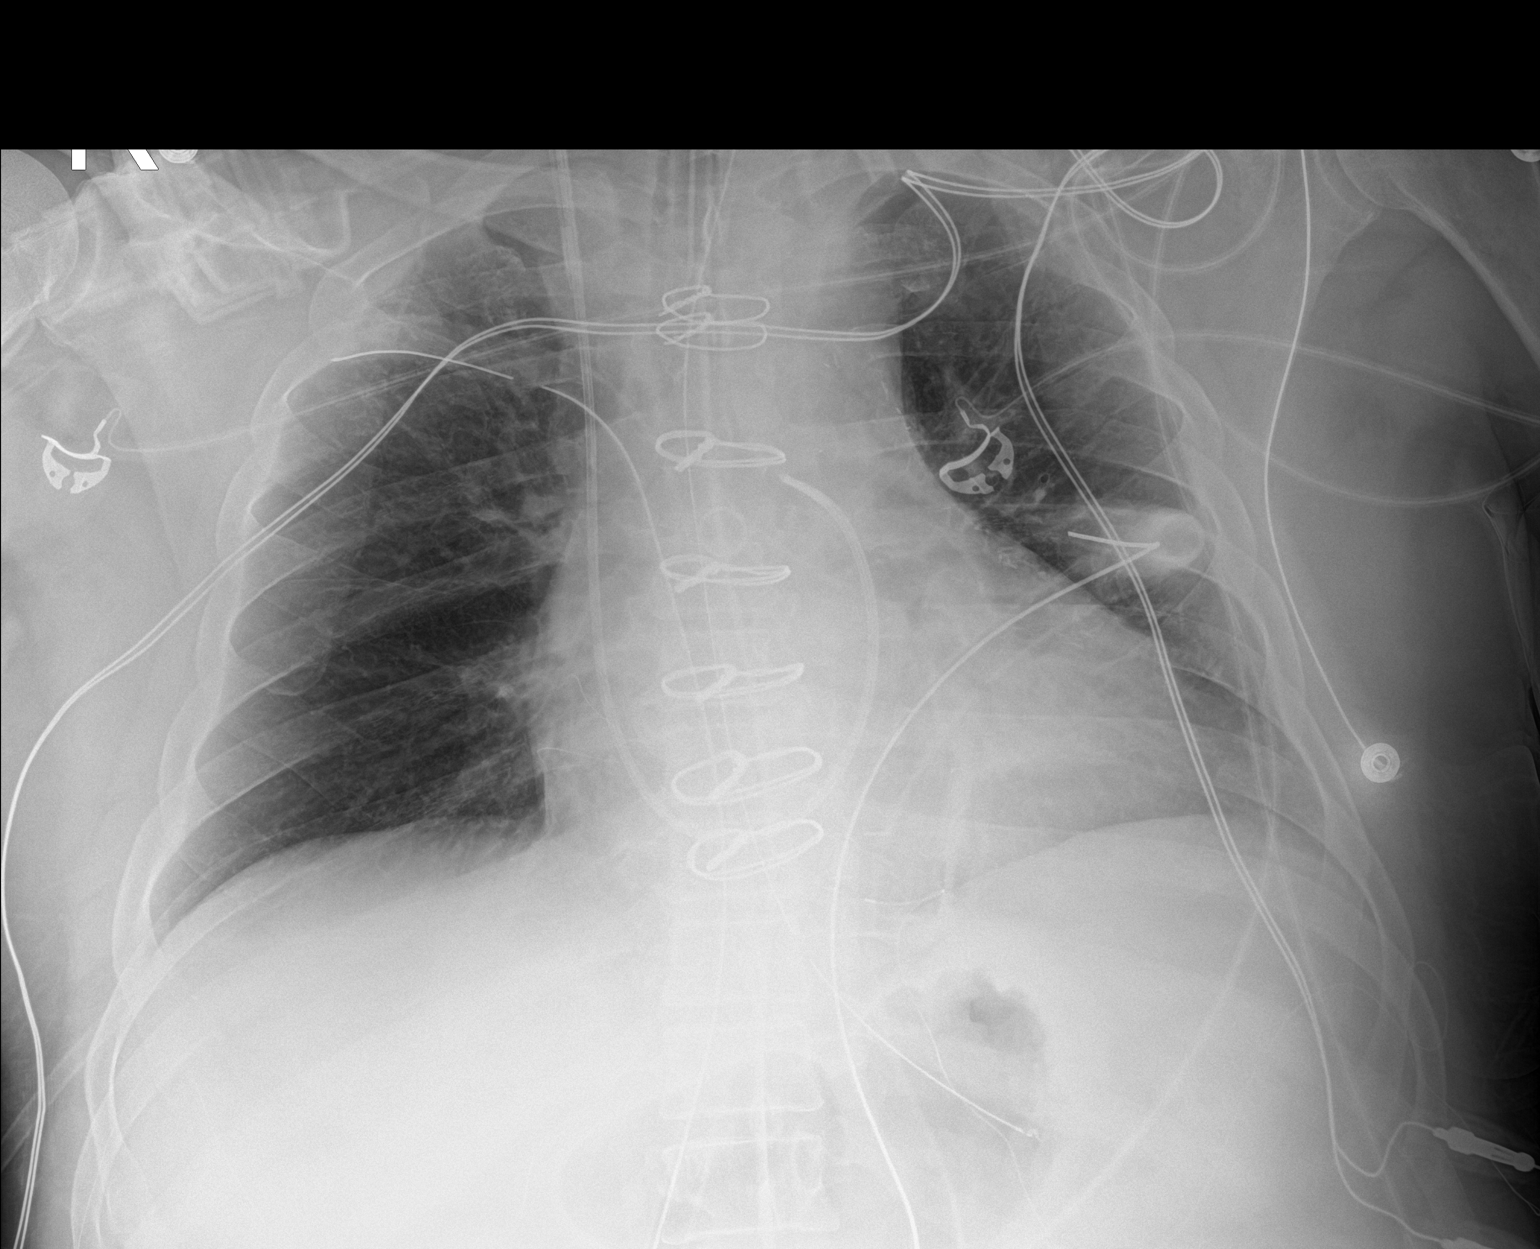

[1 of 1 positions shown; findings below may reference images not displayed]

FINDINGS: Support lines remain in good position and unchanged. Lungs are well
aerated. No infiltrate edema or effusion. Negative for pneumothorax.
IMPRESSION: No acute complication postop CABG.

## 2020-02-26 IMAGING — DX DG CHEST 1V PORT
1 series · 1 of 1 positions shown · non-contrast
Comparison: 05/04/2018

CLINICAL DATA: CABG

EXAM:
PORTABLE CHEST 1 VIEW

[chest]
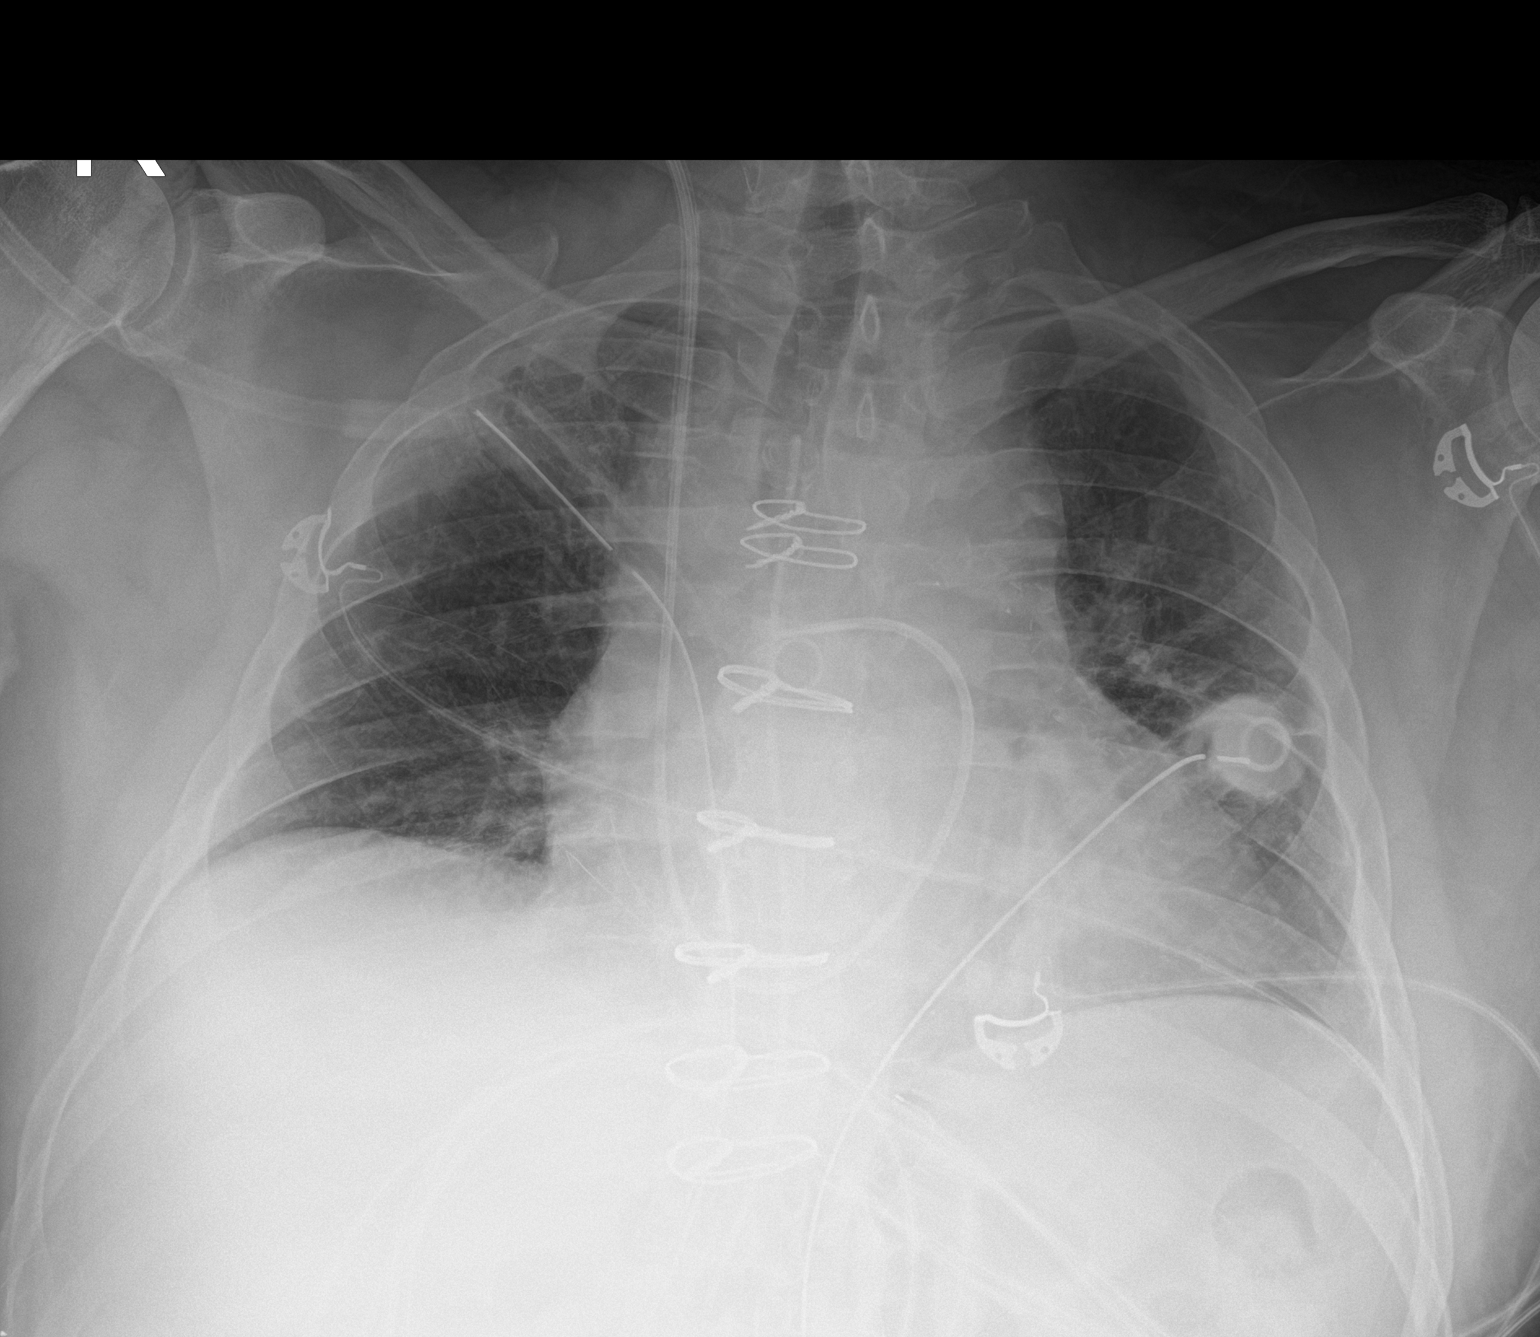

[1 of 1 positions shown; findings below may reference images not displayed]

FINDINGS: Endotracheal tube removed. Swan-Ganz catheter remains in the right
pulmonary artery. Bilateral chest tubes. No pneumothorax.

Mild bibasilar atelectasis.  No edema or effusion
IMPRESSION: Endotracheal tube removed.  Mild bibasilar atelectasis.

## 2020-02-28 IMAGING — DX DG CHEST 2V
2 series · 2 of 2 positions shown · non-contrast
Comparison: 05/06/2018

CLINICAL DATA: Status post CABG.

EXAM:
CHEST - 2 VIEW

[chest pa]
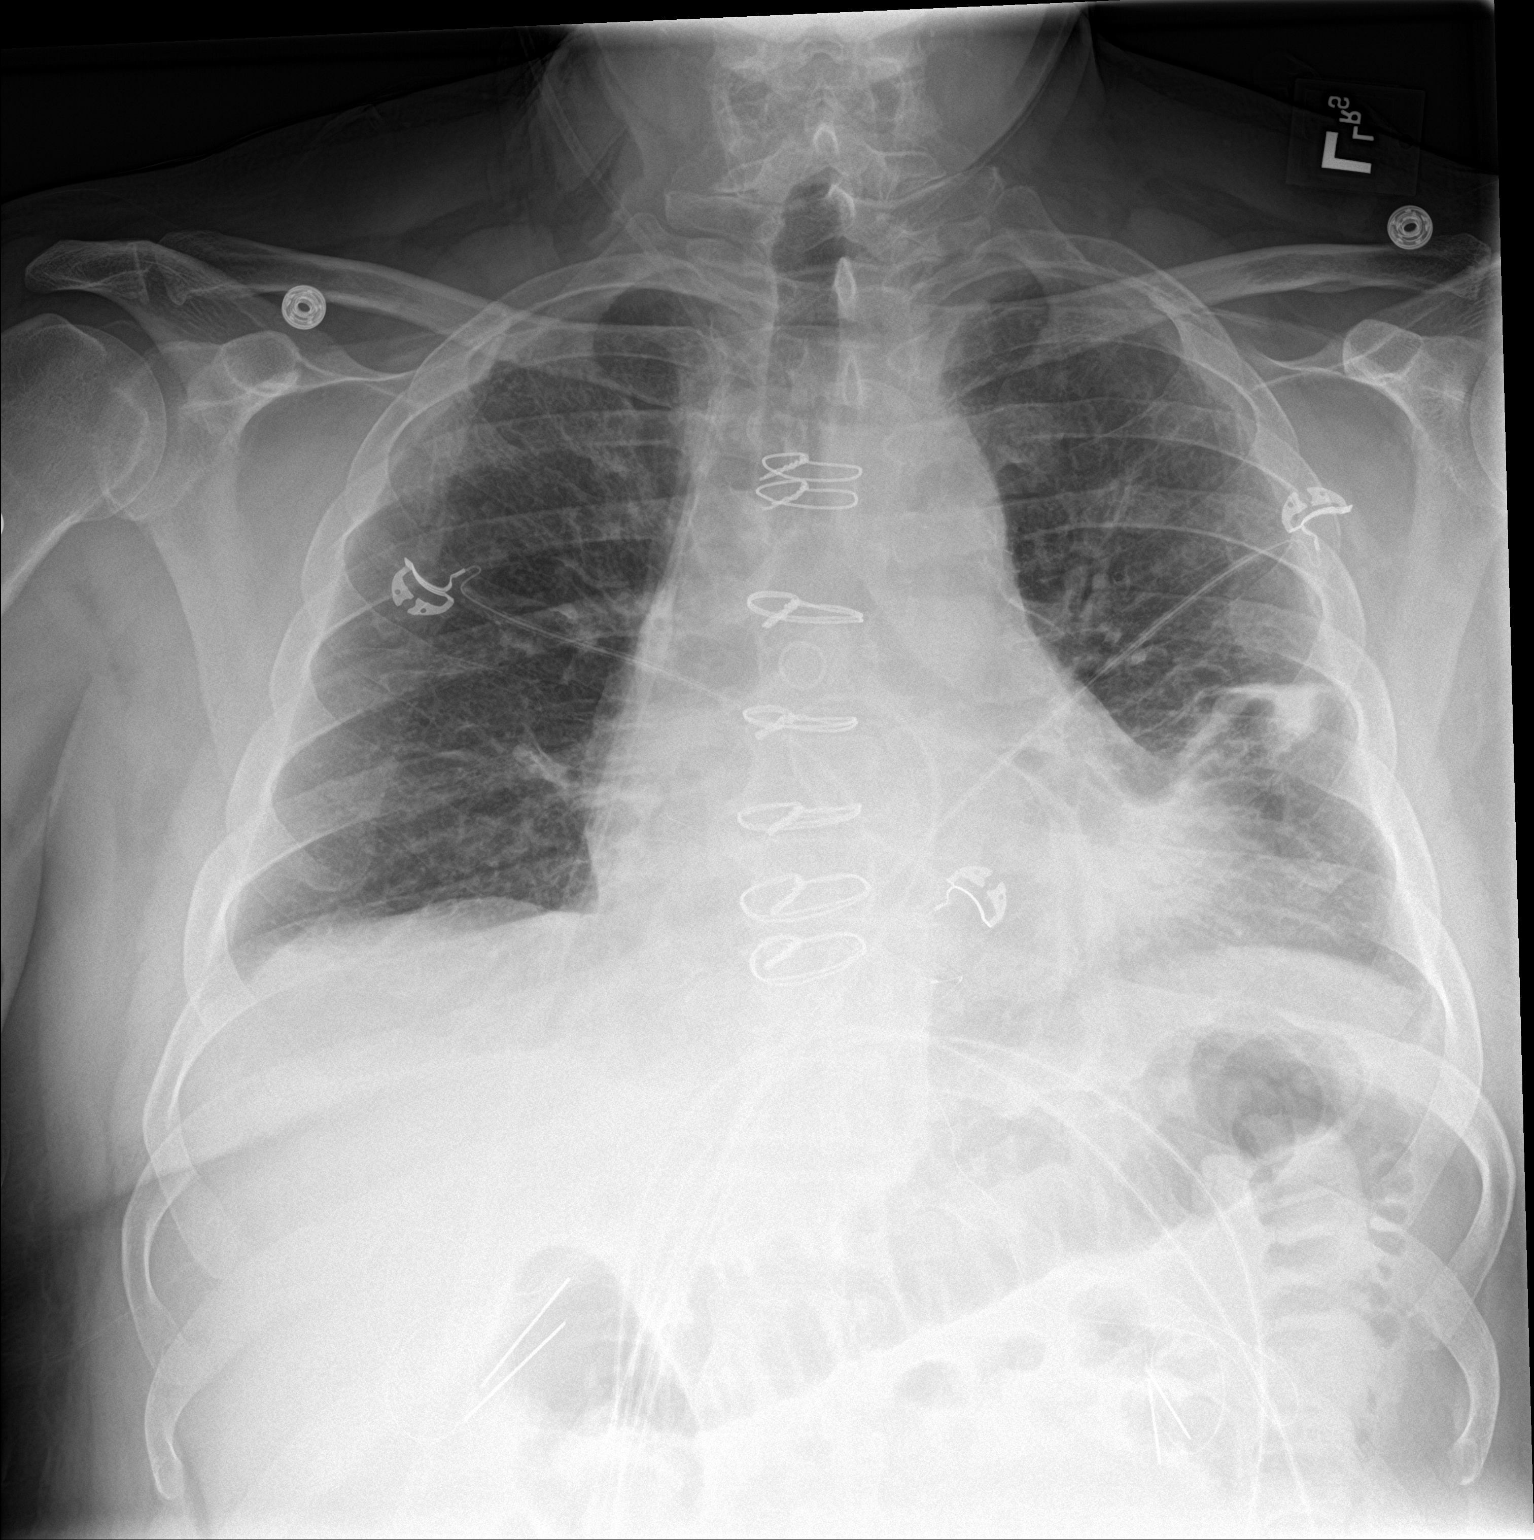

[chest lat]
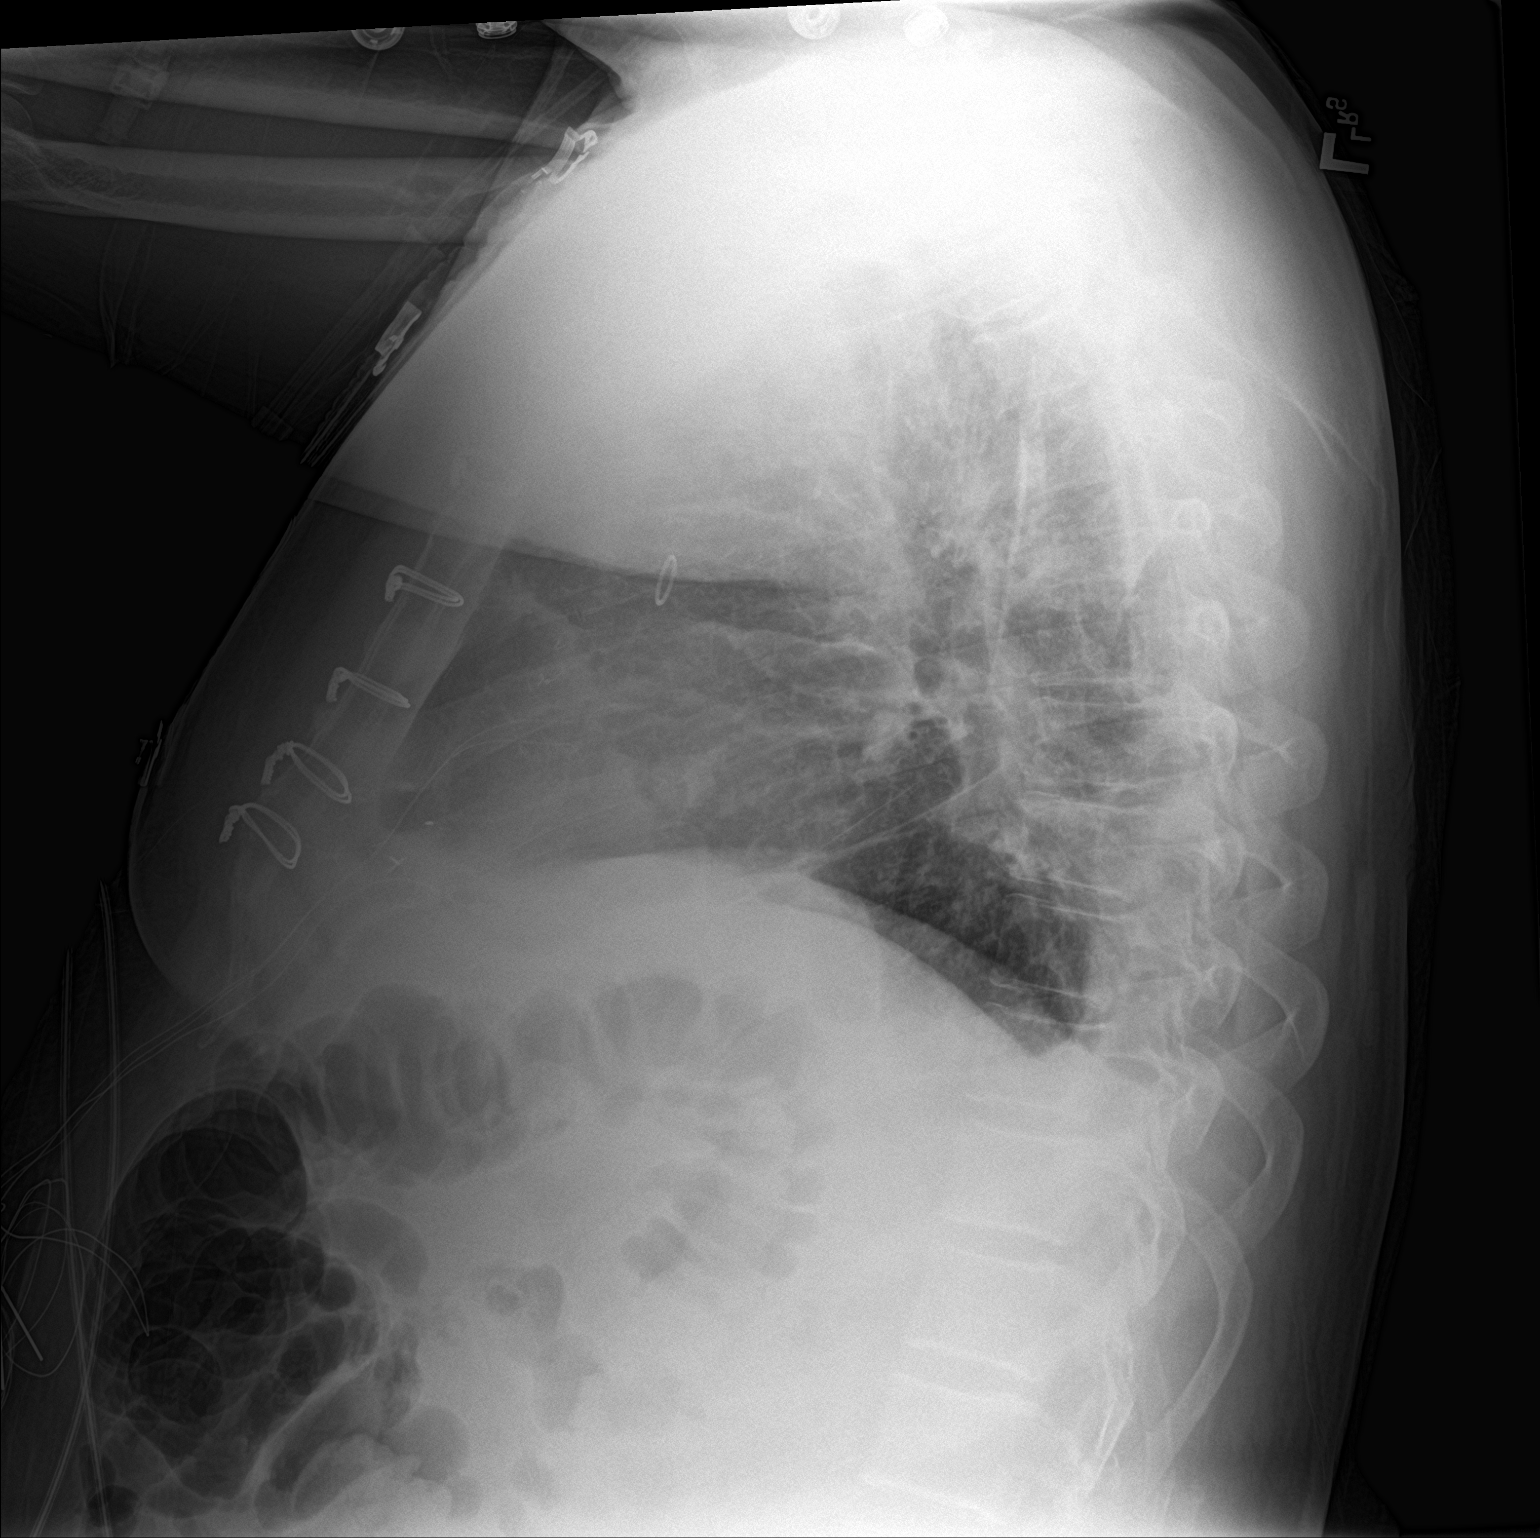

[2 of 2 positions shown; findings below may reference images not displayed]

FINDINGS: Stable heart size and mediastinal contours. Interval right jugular
sheath removal. Epicardial pacing wires remain. Stable atelectasis
in the left lower lung. No pneumothorax identified.

There is no evidence of pulmonary edema, consolidation or pleural
fluid. No bony abnormalities identified.
IMPRESSION: No pneumothorax.  Stable left lower lung atelectasis.

## 2020-04-02 ENCOUNTER — Other Ambulatory Visit: Payer: Self-pay | Admitting: Family Medicine

## 2020-04-14 ENCOUNTER — Other Ambulatory Visit: Payer: Self-pay

## 2020-04-14 ENCOUNTER — Encounter: Payer: Self-pay | Admitting: Cardiovascular Disease

## 2020-04-14 ENCOUNTER — Ambulatory Visit (INDEPENDENT_AMBULATORY_CARE_PROVIDER_SITE_OTHER): Payer: 59 | Admitting: Cardiovascular Disease

## 2020-04-14 VITALS — BP 120/70 | HR 79 | Ht 66.0 in | Wt 200.0 lb

## 2020-04-14 DIAGNOSIS — I10 Essential (primary) hypertension: Secondary | ICD-10-CM

## 2020-04-14 DIAGNOSIS — E785 Hyperlipidemia, unspecified: Secondary | ICD-10-CM | POA: Diagnosis not present

## 2020-04-14 DIAGNOSIS — E663 Overweight: Secondary | ICD-10-CM

## 2020-04-14 DIAGNOSIS — I2510 Atherosclerotic heart disease of native coronary artery without angina pectoris: Secondary | ICD-10-CM | POA: Diagnosis not present

## 2020-04-14 NOTE — Assessment & Plan Note (Signed)
History of dyslipidemia on statin therapy.  We will recheck a lipid liver profile. 

## 2020-04-14 NOTE — Assessment & Plan Note (Signed)
History of CAD status post cardiac catheterization 05/02/2018 revealing high-grade distal left main disease and segmental proximal LAD disease with right to left collaterals and normal LV function.  The following day he underwent CABG x2 by [name] the LIMA to the LAD and a vein to an obtuse marginal branch.  He is done well since then.

## 2020-04-14 NOTE — Assessment & Plan Note (Signed)
History of essential hypertension a blood pressure measured today 120/70.  He is on metoprolol.

## 2020-04-14 NOTE — Patient Instructions (Signed)
Medication Instructions:  Your physician recommends that you continue on your current medications as directed. Please refer to the Current Medication list given to you today.  *If you need a refill on your cardiac medications before your next appointment, please call your pharmacy*   Lab Work: Your physician recommends that you return for lab work in: next 1-2 weeks for lipid/liver profile.   If you have labs (blood work) drawn today and your tests are completely normal, you will receive your results only by: Marland Kitchen MyChart Message (if you have MyChart) OR . A paper copy in the mail If you have any lab test that is abnormal or we need to change your treatment, we will call you to review the results.   Follow-Up: At [facility], you and your health needs are our priority.  As part of our continuing mission to provide you with exceptional heart care, we have created designated Provider Care Teams.  These Care Teams include your primary Cardiologist (physician) and Advanced Practice Providers (APPs -  Physician Assistants and Nurse Practitioners) who all work together to provide you with the care you need, when you need it.  We recommend signing up for the patient portal called "MyChart".  Sign up information is provided on this After Visit Summary.  MyChart is used to connect with patients for Virtual Visits (Telemedicine).  Patients are able to view lab/test results, encounter notes, upcoming appointments, etc.  Non-urgent messages can be sent to your provider as well.   To learn more about what you can do with MyChart, go to [email or web address].    Your next appointment:   6 month(s)  The format for your next appointment:   In Person  Provider:   You will see one of the following Advanced Practice Providers on your designated Care Team:    Corine Shelter, PA-C  [place], [place]  Edd Fabian, [place]  Then, Nanetta Batty, MD will plan to see you again in 12  month(s).    Other Instructions Referral made for diet and weight loss management center with Dr. Quillian Quince.  [address] [place], [place], [place] [address] [phone]

## 2020-04-14 NOTE — Progress Notes (Signed)
04/14/2020 RAMZEY PETROVIC   Apr 22, 1960  [id number]  Primary Physician Tanya Nones Priscille Heidelberg, MD Primary Cardiologist: Runell Gess MD Nicholes Calamity, [place]  HPI:  Jimmy Collins is a 60 y.o.  moderately overweight married Caucasian male father of 1 child referred by Dr. Tanya Nones for cardiovascular evaluation because of new onset effort angina. He no longer works and is out on disability..I last  saw him in the office 10/03/2019. His risk factors include 80-pack-year history tobacco abuse having cut back from 2 packs a day to 1/2 pack a day 2 weeks ago at the request of his PCP. His risk factors otherwise include treated hypertension, hyperlipidemia and family history with a father who had a myocardial infarction and a brother who is had stents. His brother is my patient and I placed the stents. He also has a hiatal hernia with GERD. 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 He has had chest pain for 4 weeks which is typically exertional with pain down both arms and shortness of breath.  I performed cardiac catheterization on him 05/02/2018 revealing high-grade distal left main disease and segmental proximal LAD disease with right to left collaterals and normal LV function. The following day he underwent CABG x2 by [name] with a LIMA to his LAD and a vein to an obtuse marginal branch. Was discharged home 1 week later. He feels clinically improved.  Since I saw him 6 months ago he continues to do well.  He denies chest pain or shortness of breath.  He said 2 episodes which sound like hypoglycemia.  He no longer works and is out on disability.   Current Meds  Medication Sig  . aspirin EC 81 MG tablet Take 81 mg by mouth daily. Swallow whole.  Marland Kitchen EPINEPHrine (EPIPEN 2-PAK) 0.3 mg/0.3 mL IJ SOAJ injection Inject 0.3 mLs (0.3 mg total) into the muscle once as needed (for severe allergic reaction).  Marland Kitchen glucose blood (GLUCOSE METER  TEST) test strip Use to check fasting blood sugar qam  . lansoprazole (PREVACID) 30 MG capsule Take 30 mg by mouth daily.  . metFORMIN (GLUCOPHAGE) 1000 MG tablet Take 1 tablet (1,000 mg total) by mouth 2 (two) times daily with a meal.  . metoprolol succinate (TOPROL-XL) 25 MG 24 hr tablet TAKE 1 TABLET BY MOUTH EVERY DAY  . nitroGLYCERIN (NITROSTAT) 0.4 MG SL tablet Place 1 tablet (0.4 mg total) under the tongue every 5 (five) minutes as needed for chest pain.  . pioglitazone (ACTOS) 30 MG tablet TAKE 1 TABLET(30 MG) BY MOUTH DAILY  . rosuvastatin (CRESTOR) 20 MG tablet Take 1 tablet (20 mg total) by mouth daily.     Allergies  Allergen Reactions  . Other Hives, Itching and Swelling    Mammalian Meat Allergy  . Heparin Other (See Comments)    Pt did not remember rxn  . Shellfish Allergy Hives and Rash    Social History   Socioeconomic History  . Marital status: Married    Spouse name: Not on file  . Number of children: Not on file  . Years of education: Not on file  . Highest education level: Not on file  Occupational History  . Not on file  Tobacco Use  . Smoking status: Former Smoker    Packs/day: 1.00  . Smokeless tobacco: Never Used  Substance and Sexual Activity  . Alcohol use: No  . Drug use: Never  . Sexual  activity: Not on file  Other Topics Concern  . Not on file  Social History Narrative  . Not on file   Social Determinants of Health   Financial Resource Strain: Not on file  Food Insecurity: Not on file  Transportation Needs: Not on file  Physical Activity: Not on file  Stress: Not on file  Social Connections: Not on file  Intimate Partner Violence: Not on file     Review of Systems: General: negative for chills, fever, night sweats or weight changes.  Cardiovascular: negative for chest pain, dyspnea on exertion, edema, orthopnea, palpitations, paroxysmal nocturnal dyspnea or shortness of breath Dermatological: negative for rash Respiratory: negative  for cough or wheezing Urologic: negative for hematuria Abdominal: negative for nausea, vomiting, diarrhea, bright red blood per rectum, melena, or hematemesis Neurologic: negative for visual changes, syncope, or dizziness All other systems reviewed and are otherwise negative except as noted above.    Blood pressure 120/70, pulse 79, height 5\' 6"  (1.676 m), weight 200 lb (90.7 kg).  General appearance: alert and no distress Neck: no adenopathy, no carotid bruit, no JVD, supple, symmetrical, trachea midline and thyroid not enlarged, symmetric, no tenderness/mass/nodules Lungs: clear to auscultation bilaterally Heart: regular rate and rhythm, S1, S2 normal, no murmur, click, rub or gallop Extremities: extremities normal, atraumatic, no cyanosis or edema Pulses: 2+ and symmetric Skin: Skin color, texture, turgor normal. No rashes or lesions Neurologic: Alert and oriented X 3, normal strength and tone. Normal symmetric reflexes. Normal coordination and gait  EKG sinus rhythm at 79 with incomplete right bundle branch block.  I personally reviewed this EKG.  ASSESSMENT AND PLAN:   Dyslipidemia History of dyslipidemia on statin therapy.  We will recheck a lipid liver profile.  Essential hypertension History of essential hypertension a blood pressure measured today 120/70.  He is on metoprolol.  S/P CABG x 2 History of CAD status post cardiac catheterization 05/02/2018 revealing high-grade distal left main disease and segmental proximal LAD disease with right to left collaterals and normal LV function.  The following day he underwent CABG x2 by Dr. 05/04/2018 the LIMA to the LAD and a vein to an obtuse marginal branch.  He is done well since then.      Maren Beach MD FACP,FACC,FAHA, [facility] 04/14/2020 12:05 PM

## 2020-04-15 ENCOUNTER — Emergency Department (HOSPITAL_COMMUNITY)
Admission: EM | Admit: 2020-04-15 | Discharge: 2020-04-15 | Disposition: A | Discharge status: Home / Self Care | Payer: 59 | Attending: Emergency Medicine | Admitting: Emergency Medicine

## 2020-04-15 DIAGNOSIS — Z7984 Long term (current) use of oral hypoglycemic drugs: Secondary | ICD-10-CM | POA: Insufficient documentation

## 2020-04-15 DIAGNOSIS — T7800XA Anaphylactic reaction due to unspecified food, initial encounter: Secondary | ICD-10-CM | POA: Insufficient documentation

## 2020-04-15 DIAGNOSIS — I10 Essential (primary) hypertension: Secondary | ICD-10-CM | POA: Diagnosis not present

## 2020-04-15 DIAGNOSIS — Z7982 Long term (current) use of aspirin: Secondary | ICD-10-CM | POA: Insufficient documentation

## 2020-04-15 DIAGNOSIS — T7840XA Allergy, unspecified, initial encounter: Secondary | ICD-10-CM | POA: Diagnosis present

## 2020-04-15 DIAGNOSIS — Z79899 Other long term (current) drug therapy: Secondary | ICD-10-CM | POA: Insufficient documentation

## 2020-04-15 DIAGNOSIS — T782XXA Anaphylactic shock, unspecified, initial encounter: Secondary | ICD-10-CM

## 2020-04-15 DIAGNOSIS — Z955 Presence of coronary angioplasty implant and graft: Secondary | ICD-10-CM | POA: Diagnosis not present

## 2020-04-15 DIAGNOSIS — I2510 Atherosclerotic heart disease of native coronary artery without angina pectoris: Secondary | ICD-10-CM | POA: Insufficient documentation

## 2020-04-15 DIAGNOSIS — Z87891 Personal history of nicotine dependence: Secondary | ICD-10-CM | POA: Insufficient documentation

## 2020-04-15 DIAGNOSIS — E119 Type 2 diabetes mellitus without complications: Secondary | ICD-10-CM | POA: Diagnosis not present

## 2020-04-15 LAB — CBG MONITORING, ED: Glucose-Capillary: 218 mg/dL — ABNORMAL HIGH (ref 70–99)

## 2020-04-15 MED ORDER — EPINEPHRINE 0.3 MG/0.3ML IJ SOAJ: 0.3000 mg | Freq: Once | INTRAMUSCULAR | 1 refills | Status: DC | PRN

## 2020-04-15 MED ORDER — DEXAMETHASONE SODIUM PHOSPHATE 10 MG/ML IJ SOLN
10.0000 mg | Freq: Once | INTRAMUSCULAR | Status: AC
  Administered 2020-04-15: 10 mg via INTRAVENOUS
  Filled 2020-04-15: qty 1

## 2020-04-15 MED ORDER — FAMOTIDINE IN NACL 20-0.9 MG/50ML-% IV SOLN
20.0000 mg | Freq: Once | INTRAVENOUS | Status: AC
  Administered 2020-04-15: 20 mg via INTRAVENOUS
  Filled 2020-04-15: qty 50

## 2020-04-15 NOTE — ED Provider Notes (Signed)
[facility] EMERGENCY DEPARTMENT Provider Note   CSN: [id number] Arrival date & time: 04/15/20  1931     History No chief complaint on file.   Jimmy Collins is a 60 y.o. male.  The history is provided by the patient.  Allergic Reaction Presenting symptoms: difficulty breathing, itching, rash and wheezing   Difficulty breathing:    Severity:  Mild   Onset quality:  Gradual   Duration:  1 hour   Progression:  Waxing and waning Itching:    Location:  Full body   Severity:  Mild   Onset quality:  Gradual   Duration:  1 hour   Timing:  Constant   Progression:  Unchanged Severity:  Mild Duration:  1 hour Prior allergic episodes:  Allergies to medications and food/nut allergies      Past Medical History:  Diagnosis Date  . 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  Alpha-gal allergy  . Diabetes mellitus without complication (HCC)   . Dyslipidemia   . GERD (gastroesophageal reflux disease)   . Hypertension   . Kidney stones   . [facility] spotted fever   . Smoker     Patient Active Problem List   Diagnosis Date Noted  . Fatigue 02/10/2020  . Non-insulin dependent type 2 diabetes mellitus (HCC) 02/10/2020  . S/P CABG x 2 05/04/2018  . CAD (coronary artery disease) 05/02/2018  . Essential hypertension 04/30/2018  . Family history of heart disease 04/30/2018  . Unstable angina (HCC) 04/30/2018  . Dyslipidemia   . Smoker   . Mammalian Meat Allergy   . GERD (gastroesophageal reflux disease)   . GERD 04/28/2008  . NEPHROLITHIASIS, HX OF 04/28/2008    Past Surgical History:  Procedure Laterality Date  . CORONARY ARTERY BYPASS GRAFT N/A 05/03/2018   Procedure: CORONARY ARTERY BYPASS GRAFTING (CABG), ON PUMP, TIMES TWO, USING LEFT INTERNAL MAMMARY ARTERY AND ENDOSCOPICALLY HARVESTED LEFT GREATER SAPHENOUS VEIN;  Surgeon: Kerin Perna, MD;  Location: [facility] OR;  Service: Open Heart Surgery;  Laterality: N/A;   . KIDNEY STONE SURGERY     bilateral  . KNEE SURGERY     left  . LEFT HEART CATH AND CORONARY ANGIOGRAPHY N/A 05/02/2018   Procedure: LEFT HEART CATH AND CORONARY ANGIOGRAPHY;  Surgeon: Runell Gess, MD;  Location: [facility] INVASIVE CV LAB;  Service: Cardiovascular;  Laterality: N/A;  . TEE WITHOUT CARDIOVERSION N/A 05/03/2018   Procedure: TRANSESOPHAGEAL ECHOCARDIOGRAM (TEE);  Surgeon: Donata Clay, Theron Arista, MD;  Location: [facility] OR;  Service: Open Heart Surgery;  Laterality: N/A;       No family history on file.  Social History   Tobacco Use  . Smoking status: Former Smoker    Packs/day: 1.00  . Smokeless tobacco: Never Used  Substance Use Topics  . Alcohol use: No  . Drug use: Never    Home Medications Prior to Admission medications   Medication Sig Start Date End Date Taking? Authorizing Provider  aspirin EC 81 MG tablet Take 81 mg by mouth daily. Swallow whole.    [provider]  EPINEPHrine (EPIPEN 2-PAK) 0.3 mg/0.3 mL IJ SOAJ injection Inject 0.3 mg into the muscle once as needed (for severe allergic reaction). 04/15/20   Kathleen Lime, MD  glucose blood (GLUCOSE METER TEST) test strip Use to check fasting blood sugar qam 07/21/19   Donita Brooks, MD  lansoprazole (PREVACID) 30 MG capsule Take 30 mg by mouth daily.  [provider]  metFORMIN (GLUCOPHAGE) 1000 MG tablet Take 1 tablet (1,000 mg total) by mouth 2 (two) times daily with a meal. 07/21/19   Donita Brooks, MD  metoprolol succinate (TOPROL-XL) 25 MG 24 hr tablet TAKE 1 TABLET BY MOUTH EVERY DAY 12/08/19   Donita Brooks, MD  nitroGLYCERIN (NITROSTAT) 0.4 MG SL tablet Place 1 tablet (0.4 mg total) under the tongue every 5 (five) minutes as needed for chest pain. 04/19/18   Donita Brooks, MD  pioglitazone (ACTOS) 30 MG tablet TAKE 1 TABLET(30 MG) BY MOUTH DAILY 04/02/20   Donita Brooks, MD  rosuvastatin (CRESTOR) 20 MG tablet Take 1 tablet (20 mg total) by mouth daily. 11/20/19   Donita Brooks, MD    Allergies    Other, Heparin, and Shellfish allergy  Review of Systems   Review of Systems  Constitutional: Negative for chills and fever.  HENT: Negative for ear pain and sore throat.   Eyes: Negative for pain and visual disturbance.  Respiratory: Positive for wheezing. Negative for cough and shortness of breath.   Cardiovascular: Negative for chest pain and palpitations.  Gastrointestinal: Negative for abdominal pain and vomiting.  Genitourinary: Negative for dysuria and hematuria.  Musculoskeletal: Negative for arthralgias and back pain.  Skin: Positive for itching and rash. Negative for color change.  Neurological: Negative for seizures and syncope.  All other systems reviewed and are negative.   Physical Exam Updated Vital Signs BP 102/66   Pulse 83   Temp 98 F (36.7 C) (Oral)   Resp 10   Ht 5\' 5"  (1.651 m)   SpO2 96%   BMI 33.28 kg/m   Physical Exam Vitals and nursing note reviewed.  Constitutional:      Appearance: He is well-developed and well-nourished.  HENT:     Head: Normocephalic and atraumatic.  Eyes:     Conjunctiva/sclera: Conjunctivae normal.  Cardiovascular:     Rate and Rhythm: Normal rate and regular rhythm.     Heart sounds: No murmur heard.   Pulmonary:     Effort: Pulmonary effort is normal. No respiratory distress.     Breath sounds: Normal breath sounds.  Chest:     Comments: Midline sternotomy scar Abdominal:     Palpations: Abdomen is soft.     Tenderness: There is no abdominal tenderness.  Musculoskeletal:        General: No edema.     Cervical back: Neck supple.  Skin:    General: Skin is warm and dry.     Capillary Refill: Capillary refill takes less than 2 seconds.     Findings: Erythema and rash present. Rash is macular.  Neurological:     Mental Status: He is alert.  Psychiatric:        Mood and Affect: Mood and affect normal.     ED Results / Procedures / Treatments   Labs (all labs ordered are  listed, but only abnormal results are displayed) Labs Reviewed  CBG MONITORING, ED - Abnormal; Notable for the following components:      Result Value   Glucose-Capillary 218 (*)    All other components within normal limits    EKG None  Radiology No results found.  Procedures Procedures   Medications Ordered in ED Medications  famotidine (PEPCID) IVPB 20 mg premix (0 mg Intravenous Stopped 04/15/20 2036)  dexamethasone (DECADRON) injection 10 mg (10 mg Intravenous Given 04/15/20 2003)    ED Course  I have reviewed the  triage vital signs and the nursing notes.  Pertinent labs & imaging results that were available during my care of the patient were reviewed by me and considered in my medical decision making (see chart for details).    MDM Rules/Calculators/A&P                          This is a 60 year old male with a past medical history of hypertension, GERD, diabetes, CAD status post CABG, multiple allergies including shellfish and pork, who presents emergency department via EMS with symptoms concerning for allergy versus anaphylactoid reaction.  Patient reports at 1500 he ate some food that he did not know contained pork, around 1800 he was outside working on his generator and he developed a rash, pruritus, and some difficulty breathing.  EMS was called, fire was a first on scene and reported he was "in and out of consciousness", they placed the patient on a nonrebreather, upon EMS arrival, he was given 0.3 mg of epinephrine to the left deltoid and transported to the ED.  In route he started complaining of some nausea and was given 4 mg of Zofran.  On arrival the patient is afebrile, having low normotensive blood pressures with initial systolic in the 90s, answering questions appropriately.  He has diffuse blanching erythematous rash from the chest down to his toes, no significant urticarial welts.  He does complain of generalized pruritus mostly in his feet.  He reports that his  breathing difficulty has resolved after receiving the epinephrine by EMS. Epi given at 1903. He has no obvious wheezes on exam and is not in any significant respiratory distress.  His abdomen is soft and nontender.  Symptoms and presentation are most consistent with anaphylactoid-like reaction, improving after receiving epinephrine by EMS.  Initial blood pressure with a systolic in the 90s and was given a liter of fluids with improvement in pressure.  He was given 20 mg of famotidine in the emergency department, given that he had no wheezes, active nausea, worsening symptoms, did not believe he needed redosed with epinephrine on initial arrival.  Continue to monitor.  Patient was monitored in the emergency department for greater than 3 hours after receiving epinephrine, he had improvement of symptoms without any recurrence.  He is not requiring any oxygen, his rash has nearly resolved, he has no wheezes, he has no acute complaints at this time.  Believe patient is stable for discharge home, he was provided with a prescription for new EpiPen's and strict return precautions were discussed.  Recommendation that he see his primary care doctor tomorrow or on Monday and if he has any recurrence of symptoms to her administer the EpiPen and return to the emergency department.  Final Clinical Impression(s) / ED Diagnoses Final diagnoses:  Anaphylactoid reaction, initial encounter    Rx / DC Orders ED Discharge Orders         Ordered    EPINEPHrine (EPIPEN 2-PAK) 0.3 mg/0.3 mL IJ SOAJ injection  Once PRN        04/15/20 2100           Kathleen Lime, MD 04/16/20 0101    Jacalyn Lefevre, MD 04/16/20 [id number]

## 2020-04-15 NOTE — Discharge Instructions (Addendum)
Please follow-up with your primary care doctor, a refill of EpiPen's were called into your pharmacy.  On rare occasions the symptoms of allergy/anaphylaxis can return, if they do please inject yourself with the EpiPen and return the emergency department immediately.

## 2020-04-15 NOTE — ED Notes (Signed)
E-signature pad unavailable at time of pt discharge. This RN discussed discharge materials with pt and answered all pt questions. Pt stated understanding of discharge material. ? ?

## 2020-04-15 NOTE — ED Notes (Signed)
Lupita Leash, wife, [phone] would like an update when available

## 2020-04-15 NOTE — ED Triage Notes (Signed)
Pt BIB EMS from home c/o allergic reaction to pork ingestion. In and out of consciousness with EMS. Redness on chest, audible wheezing that resolved with Epi admin, trouble breathing began again en route.   C/o N/V  Arrives 15L NRB - 99% oxygen saturation  75mg  PO benadryl given by wife, 0.3 Epi given by EMS. 4mg  zofran given by EMS.   EMS:  96/50 RR 24 99% NRM

## 2020-06-06 ENCOUNTER — Other Ambulatory Visit: Payer: Self-pay | Admitting: Family Medicine

## 2020-07-06 ENCOUNTER — Other Ambulatory Visit: Payer: Self-pay | Admitting: Family Medicine

## 2020-08-05 ENCOUNTER — Other Ambulatory Visit: Payer: Self-pay | Admitting: Family Medicine

## 2020-08-14 ENCOUNTER — Other Ambulatory Visit: Payer: Self-pay | Admitting: Family Medicine

## 2020-08-24 ENCOUNTER — Ambulatory Visit: Payer: Self-pay | Admitting: Family Medicine

## 2020-09-03 ENCOUNTER — Ambulatory Visit (INDEPENDENT_AMBULATORY_CARE_PROVIDER_SITE_OTHER): Payer: 59 | Admitting: Family Medicine

## 2020-09-03 ENCOUNTER — Other Ambulatory Visit: Payer: Self-pay

## 2020-09-03 ENCOUNTER — Encounter: Payer: Self-pay | Admitting: Family Medicine

## 2020-09-03 VITALS — BP 142/78 | HR 68 | Temp 98.2°F | Resp 14 | Ht 66.0 in | Wt 234.0 lb

## 2020-09-03 DIAGNOSIS — Z125 Encounter for screening for malignant neoplasm of prostate: Secondary | ICD-10-CM

## 2020-09-03 DIAGNOSIS — IMO0002 Reserved for concepts with insufficient information to code with codable children: Secondary | ICD-10-CM

## 2020-09-03 DIAGNOSIS — Z951 Presence of aortocoronary bypass graft: Secondary | ICD-10-CM | POA: Diagnosis not present

## 2020-09-03 DIAGNOSIS — I10 Essential (primary) hypertension: Secondary | ICD-10-CM | POA: Diagnosis not present

## 2020-09-03 DIAGNOSIS — E1159 Type 2 diabetes mellitus with other circulatory complications: Secondary | ICD-10-CM | POA: Diagnosis not present

## 2020-09-03 DIAGNOSIS — I2510 Atherosclerotic heart disease of native coronary artery without angina pectoris: Secondary | ICD-10-CM | POA: Diagnosis not present

## 2020-09-03 DIAGNOSIS — E1165 Type 2 diabetes mellitus with hyperglycemia: Secondary | ICD-10-CM

## 2020-09-03 MED ORDER — LISINOPRIL 10 MG PO TABS: 10.0000 mg | ORAL_TABLET | Freq: Every day | ORAL | 3 refills | Status: DC

## 2020-09-03 NOTE — Progress Notes (Signed)
Subjective:    Patient ID: Jimmy Collins, male    DOB: 1960-11-10, 60 y.o.   MRN: [id number]  HPI  Patient is a 60 year old Caucasian male with a history of diabetes mellitus, dyslipidemia, hypertension, and coronary artery disease.  He has not been seen in over a year.  His blood pressure today is slightly elevated.  He is not checking his blood pressure regularly at home.  He is consistently taking his aspirin along with his beta-blocker.  He is not on an ACE inhibitor.  He has no history of an ACE inhibitor allergy.  He would benefit from an ACE inhibitor both due to his history of coronary artery disease as well as his history of diabetes mellitus.  He is not checking his sugar regularly however he denies any hyperglycemia.  He denies any polyuria, polydipsia, or blurry vision.  He denies any angina.  He denies any chest pain.  He denies any shortness of breath or dyspnea on exertion.  He is overdue for prostate cancer screening. 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  Alpha-gal allergy   Diabetes mellitus without complication (HCC)    Dyslipidemia    GERD (gastroesophageal reflux disease)    Hypertension    Kidney stones    [facility] spotted fever    Smoker    Past Surgical History:  Procedure Laterality Date   CORONARY ARTERY BYPASS GRAFT N/A 05/03/2018   Procedure: CORONARY ARTERY BYPASS GRAFTING (CABG), ON PUMP, TIMES TWO, USING LEFT INTERNAL MAMMARY ARTERY AND ENDOSCOPICALLY HARVESTED LEFT GREATER SAPHENOUS VEIN;  Surgeon: Kerin Perna, MD;  Location: [facility] OR;  Service: Open Heart Surgery;  Laterality: N/A;   KIDNEY STONE SURGERY     bilateral   KNEE SURGERY     left   LEFT HEART CATH AND CORONARY ANGIOGRAPHY N/A 05/02/2018   Procedure: LEFT HEART CATH AND CORONARY ANGIOGRAPHY;  Surgeon: Runell Gess, MD;  Location: [facility] INVASIVE CV LAB;  Service: Cardiovascular;  Laterality: N/A;   TEE  WITHOUT CARDIOVERSION N/A 05/03/2018   Procedure: TRANSESOPHAGEAL ECHOCARDIOGRAM (TEE);  Surgeon: Donata Clay, Theron Arista, MD;  Location: [facility] OR;  Service: Open Heart Surgery;  Laterality: N/A;   Current Outpatient Medications on File Prior to Visit  Medication Sig Dispense Refill   aspirin EC 81 MG tablet Take 81 mg by mouth daily. Swallow whole.     EPINEPHrine (EPIPEN 2-PAK) 0.3 mg/0.3 mL IJ SOAJ injection Inject 0.3 mg into the muscle once as needed (for severe allergic reaction). 1 each 1   glucose blood (GLUCOSE METER TEST) test strip Use to check fasting blood sugar qam 100 each 12   lansoprazole (PREVACID) 30 MG capsule Take 30 mg by mouth daily.     metFORMIN (GLUCOPHAGE) 1000 MG tablet Take 1 tablet (1,000 mg total) by mouth 2 (two) times daily with a meal. 180 tablet 3   metoprolol succinate (TOPROL-XL) 25 MG 24 hr tablet TAKE 1 TABLET BY MOUTH EVERY DAY 30 tablet 5   pioglitazone (ACTOS) 30 MG tablet TAKE 1 TABLET(30 MG) BY MOUTH DAILY 30 tablet 0   rosuvastatin (CRESTOR) 20 MG tablet Take 1 tablet (20 mg total) by mouth daily. 90 tablet 3   nitroGLYCERIN (NITROSTAT) 0.4 MG SL tablet Place 1 tablet (0.4 mg total) under the tongue every 5 (five) minutes as needed for chest pain. (Patient not taking: Reported on 09/03/2020) 50 tablet 3  No current facility-administered medications on file prior to visit.   Allergies  Allergen Reactions   Other Hives, Itching and Swelling    Mammalian Meat Allergy   Heparin Other (See Comments)    Pt did not remember rxn   Shellfish Allergy Hives and Rash   Social History   Socioeconomic History   Marital status: Married    Spouse name: Not on file   Number of children: Not on file   Years of education: Not on file   Highest education level: Not on file  Occupational History   Not on file  Tobacco Use   Smoking status: Former    Packs/day: 1.00    Pack years: 0.00    Types: Cigarettes   Smokeless tobacco: Never  Substance and Sexual Activity    Alcohol use: No   Drug use: Never   Sexual activity: Yes  Other Topics Concern   Not on file  Social History Narrative   Not on file   Social Determinants of Health   Financial Resource Strain: Not on file  Food Insecurity: Not on file  Transportation Needs: Not on file  Physical Activity: Not on file  Stress: Not on file  Social Connections: Not on file  Intimate Partner Violence: Not on file      Review of Systems     Objective:   Physical Exam Vitals reviewed.  Constitutional:      Appearance: Normal appearance. He is obese.  Cardiovascular:     Rate and Rhythm: Normal rate and regular rhythm.     Heart sounds: Normal heart sounds. No murmur heard.   No friction rub. No gallop.  Pulmonary:     Effort: Pulmonary effort is normal. No respiratory distress.     Breath sounds: Normal breath sounds. No wheezing, rhonchi or rales.  Abdominal:     General: Abdomen is flat. Bowel sounds are normal. There is no distension.     Palpations: Abdomen is soft. There is no mass.     Tenderness: There is no abdominal tenderness. There is no guarding or rebound.     Hernia: No hernia is present.  Musculoskeletal:     Right lower leg: No edema.     Left lower leg: No edema.  Neurological:     General: No focal deficit present.     Mental Status: He is alert and oriented to person, place, and time. Mental status is at baseline.     Cranial Nerves: No cranial nerve deficit.     Motor: No weakness.       S/P CABG x 2  Essential hypertension  Coronary artery disease involving native coronary artery of native heart without angina pectoris  Uncontrolled diabetes mellitus with circulatory complication, without long-term current use of insulin (HCC) - Plan: Hemoglobin A1c, CBC with Differential/Platelet, COMPLETE METABOLIC PANEL WITH GFR, Lipid panel, Microalbumin, urine  Prostate cancer screening - Plan: PSA  Due to his history of coronary artery disease status post CABG  x2 as well as his elevated blood pressure and his diabetes, I would like to start the patient on lisinopril 10 mg a day.  I believe that this would provide renal protection from the diabetes along with lowering his blood pressure and helping to prevent future heart disease.  Check a hemoglobin A1c.  Goal hemoglobin A1c is 6.5 or less.  If elevated above 6.5, I would like to start the patient on Jardiance given his history of coronary artery disease.  Check a  CBC, CMP, lipid panel.  Goal LDL cholesterol is less than 70.  Check a urine microalbumin.  Screen for prostate cancer with a PSA.

## 2020-09-04 ENCOUNTER — Other Ambulatory Visit: Payer: Self-pay | Admitting: Family Medicine

## 2020-09-04 LAB — CBC WITH DIFFERENTIAL/PLATELET
Absolute Monocytes: 783 cells/uL (ref 200–950)
Basophils Absolute: 113 cells/uL (ref 0–200)
Basophils Relative: 1.3 %
Eosinophils Absolute: 687 cells/uL — ABNORMAL HIGH (ref 15–500)
Eosinophils Relative: 7.9 %
HCT: 47 % (ref 38.5–50.0)
Hemoglobin: 15.6 g/dL (ref 13.2–17.1)
Lymphs Abs: 3280 cells/uL (ref 850–3900)
MCH: 29.1 pg (ref 27.0–33.0)
MCHC: 33.2 g/dL (ref 32.0–36.0)
MCV: 87.5 fL (ref 80.0–100.0)
MPV: 11 fL (ref 7.5–12.5)
Monocytes Relative: 9 %
Neutro Abs: 3837 cells/uL (ref 1500–7800)
Neutrophils Relative %: 44.1 %
Platelets: 250 10*3/uL (ref 140–400)
RBC: 5.37 10*6/uL (ref 4.20–5.80)
RDW: 13.7 % (ref 11.0–15.0)
Total Lymphocyte: 37.7 %
WBC: 8.7 10*3/uL (ref 3.8–10.8)

## 2020-09-04 LAB — LIPID PANEL
Cholesterol: 101 mg/dL (ref <200)
HDL: 35 mg/dL — ABNORMAL LOW (ref >=40)
LDL Cholesterol (Calc): 42 mg/dL (calc)
Non-HDL Cholesterol (Calc): 66 mg/dL (calc) (ref <130)
Total CHOL/HDL Ratio: 2.9 (calc) (ref <5.0)
Triglycerides: 153 mg/dL — ABNORMAL HIGH (ref <150)

## 2020-09-04 LAB — COMPLETE METABOLIC PANEL WITH GFR
AG Ratio: 1.6 (calc) (ref 1.0–2.5)
ALT: 24 U/L (ref 9–46)
AST: 24 U/L (ref 10–35)
Albumin: 4.2 g/dL (ref 3.6–5.1)
Alkaline phosphatase (APISO): 48 U/L (ref 35–144)
BUN: 9 mg/dL (ref 7–25)
CO2: 31 mmol/L (ref 20–32)
Calcium: 9.6 mg/dL (ref 8.6–10.3)
Chloride: 105 mmol/L (ref 98–110)
Creat: 0.95 mg/dL (ref 0.70–1.25)
GFR, Est African American: 100 mL/min/{1.73_m2} (ref >=60)
GFR, Est Non African American: 87 mL/min/{1.73_m2} (ref >=60)
Globulin: 2.6 g/dL (calc) (ref 1.9–3.7)
Glucose, Bld: 114 mg/dL — ABNORMAL HIGH (ref 65–99)
Potassium: 5 mmol/L (ref 3.5–5.3)
Sodium: 142 mmol/L (ref 135–146)
Total Bilirubin: 0.6 mg/dL (ref 0.2–1.2)
Total Protein: 6.8 g/dL (ref 6.1–8.1)

## 2020-09-04 LAB — MICROALBUMIN, URINE: Microalb, Ur: 3.3 mg/dL

## 2020-09-04 LAB — HEMOGLOBIN A1C
Hgb A1c MFr Bld: 5.9 % of total Hgb — ABNORMAL HIGH (ref <5.7)
Mean Plasma Glucose: 123 mg/dL
eAG (mmol/L): 6.8 mmol/L

## 2020-09-04 LAB — PSA: PSA: 0.21 ng/mL (ref <=4.00)

## 2020-09-06 ENCOUNTER — Encounter: Payer: Self-pay | Admitting: *Deleted

## 2020-09-08 ENCOUNTER — Telehealth: Payer: Self-pay | Admitting: Family Medicine

## 2020-09-08 NOTE — Telephone Encounter (Signed)
Pt spouse called in concerned about the lab results of pt. And also needing the lab results sent the pt's cardiologist for an appt that the pt has scheduled 7/27. Please advise.  Cb#: [phone]

## 2020-09-09 NOTE — Telephone Encounter (Signed)
09/03/2020 Lab results are as follows: Labs are excellent.  HgbA1c is 5.9% which is great. CBC is normal. Electrolytes, kidney function, liver enzymes normal.  Cholesterol levels excellent and LDL less than 70 which is awesome. PSA normal.   Letter was sent to patient with results.   Call placed to patient and patient wife made aware.   Cardiologist is on Epic and can review results. Routed results to [name].

## 2020-10-06 ENCOUNTER — Other Ambulatory Visit: Payer: Self-pay

## 2020-10-06 ENCOUNTER — Encounter: Payer: Self-pay | Admitting: Cardiovascular Disease

## 2020-10-06 ENCOUNTER — Ambulatory Visit (INDEPENDENT_AMBULATORY_CARE_PROVIDER_SITE_OTHER): Payer: 59 | Admitting: Cardiovascular Disease

## 2020-10-06 VITALS — BP 120/62 | HR 69 | Ht 65.0 in | Wt 227.8 lb

## 2020-10-06 DIAGNOSIS — E785 Hyperlipidemia, unspecified: Secondary | ICD-10-CM | POA: Diagnosis not present

## 2020-10-06 DIAGNOSIS — I10 Essential (primary) hypertension: Secondary | ICD-10-CM

## 2020-10-06 DIAGNOSIS — F17200 Nicotine dependence, unspecified, uncomplicated: Secondary | ICD-10-CM | POA: Diagnosis not present

## 2020-10-06 DIAGNOSIS — E663 Overweight: Secondary | ICD-10-CM | POA: Diagnosis not present

## 2020-10-06 DIAGNOSIS — Z951 Presence of aortocoronary bypass graft: Secondary | ICD-10-CM

## 2020-10-06 NOTE — Progress Notes (Signed)
10/06/2020 Jimmy Collins   02/09/1961  [id number]  Primary Physician Tanya Nones Priscille Heidelberg, MD Primary Cardiologist: Runell Gess MD Nicholes Calamity, [place]  HPI:  Jimmy Collins is a 60 y.o.  moderately overweight married Caucasian male father of 1 child referred by Dr. Tanya Nones for cardiovascular evaluation because of new onset effort angina.  He no longer works and is out on disability..    I last  saw him in the office 04/14/2020. His risk factors include 80-pack-year history tobacco abuse having cut back from 2 packs a day to 1/2 pack a day 2 weeks ago at the request of his PCP.  His risk factors otherwise include treated hypertension, hyperlipidemia and family history with a father who had a myocardial infarction and a brother who is had stents.  His brother is my patient and I placed the stents.  He also has a hiatal hernia with GERD.  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  He has had chest pain for 4 weeks which is typically exertional with pain down both arms and shortness of breath.   I performed cardiac catheterization on him 05/02/2018 revealing high-grade distal left main disease and segmental proximal LAD disease with right to left collaterals and normal LV function.  The following day he underwent CABG x2 by [name] with a LIMA to his LAD and a vein to an obtuse marginal branch.  Was discharged home 1 week later.  He feels clinically improved.   Since I saw him 6 months ago he continues to do well.  He denies chest pain or shortness of breath.  He is currently out on disability.  He has gained 30 pound since I saw him 6 months ago.  Current Meds  Medication Sig   aspirin EC 81 MG tablet Take 81 mg by mouth daily. Swallow whole.   glucose blood (GLUCOSE METER TEST) test strip Use to check fasting blood sugar qam   lansoprazole (PREVACID) 30 MG capsule Take 30 mg by mouth daily.   lisinopril (ZESTRIL) 10 MG tablet Take 1  tablet (10 mg total) by mouth daily.   metFORMIN (GLUCOPHAGE) 1000 MG tablet Take 1 tablet (1,000 mg total) by mouth 2 (two) times daily with a meal.   metoprolol succinate (TOPROL-XL) 25 MG 24 hr tablet TAKE 1 TABLET BY MOUTH EVERY DAY   pioglitazone (ACTOS) 30 MG tablet TAKE 1 TABLET(30 MG) BY MOUTH DAILY   rosuvastatin (CRESTOR) 20 MG tablet Take 1 tablet (20 mg total) by mouth daily.     Allergies  Allergen Reactions   Other Hives, Itching and Swelling    Mammalian Meat Allergy   Heparin Other (See Comments)    Pt did not remember rxn   Shellfish Allergy Hives and Rash    Social History   Socioeconomic History   Marital status: Married    Spouse name: Not on file   Number of children: Not on file   Years of education: Not on file   Highest education level: Not on file  Occupational History   Not on file  Tobacco Use   Smoking status: Former    Packs/day: 1.00    Types: Cigarettes   Smokeless tobacco: Never  Substance and Sexual Activity   Alcohol use: No   Drug use: Never   Sexual activity: Yes  Other Topics Concern   Not on file  Social History Narrative   Not on  file   Social Determinants of Health   Financial Resource Strain: Not on file  Food Insecurity: Not on file  Transportation Needs: Not on file  Physical Activity: Not on file  Stress: Not on file  Social Connections: Not on file  Intimate Partner Violence: Not on file     Review of Systems: General: negative for chills, fever, night sweats or weight changes.  Cardiovascular: negative for chest pain, dyspnea on exertion, edema, orthopnea, palpitations, paroxysmal nocturnal dyspnea or shortness of breath Dermatological: negative for rash Respiratory: negative for cough or wheezing Urologic: negative for hematuria Abdominal: negative for nausea, vomiting, diarrhea, bright red blood per rectum, melena, or hematemesis Neurologic: negative for visual changes, syncope, or dizziness All other systems  reviewed and are otherwise negative except as noted above.    Blood pressure 120/62, pulse 69, height 5\' 5"  (1.651 m), weight 227 lb 12.8 oz (103.3 kg), SpO2 97 %.  General appearance: alert and no distress Neck: no adenopathy, no carotid bruit, no JVD, supple, symmetrical, trachea midline, and thyroid not enlarged, symmetric, no tenderness/mass/nodules Lungs: clear to auscultation bilaterally Heart: regular rate and rhythm, S1, S2 normal, no murmur, click, rub or gallop Extremities: extremities normal, atraumatic, no cyanosis or edema Pulses: 2+ and symmetric Skin: Skin color, texture, turgor normal. No rashes or lesions Neurologic: Grossly normal  EKG not performed today  ASSESSMENT AND PLAN:   Dyslipidemia History of dyslipidemia on statin therapy with lipid profile performed 09/03/2020 revealing a total cholesterol 101, LDL 42 and HDL 35.  Smoker History of discontinued tobacco abuse  Essential hypertension History of essential hypertension blood pressure measured today at 120/62.  He is on lisinopril and metoprolol.  S/P CABG x 2 History of CAD status post cardiac catheterization performed by myself 05/02/2018 revealing high-grade distal left main disease as well as segmental proximal LAD disease with right to left collaterals normal LV function.  The following day he underwent urgent CABG by Dr. 05/04/2018 with a LIMA to his LAD and a vein to an acute marginal branch.  He was discharged home 1 week later.  He has done well since.     Maren Beach MD FACP,FACC,FAHA, [facility] 10/06/2020 10:55 AM

## 2020-10-06 NOTE — Patient Instructions (Signed)

## 2020-10-06 NOTE — Assessment & Plan Note (Signed)
History of essential hypertension blood pressure measured today at 120/62.  He is on lisinopril and metoprolol.

## 2020-10-06 NOTE — Assessment & Plan Note (Signed)
History of discontinued tobacco abuse 

## 2020-10-06 NOTE — Assessment & Plan Note (Signed)
History of CAD status post cardiac catheterization performed by myself 05/02/2018 revealing high-grade distal left main disease as well as segmental proximal LAD disease with right to left collaterals normal LV function.  The following day he underwent urgent CABG by [name] with a LIMA to his LAD and a vein to an acute marginal branch.  He was discharged home 1 week later.  He has done well since.

## 2020-10-06 NOTE — Assessment & Plan Note (Signed)
History of dyslipidemia on statin therapy with lipid profile performed 09/03/2020 revealing a total cholesterol 101, LDL 42 and HDL 35.

## 2020-11-03 ENCOUNTER — Other Ambulatory Visit: Payer: Self-pay | Admitting: Family Medicine

## 2020-12-03 ENCOUNTER — Other Ambulatory Visit: Payer: Self-pay | Admitting: Family Medicine

## 2021-01-02 ENCOUNTER — Other Ambulatory Visit: Payer: Self-pay | Admitting: Family Medicine

## 2021-04-11 ENCOUNTER — Other Ambulatory Visit: Payer: Self-pay | Admitting: Family Medicine

## 2021-04-12 ENCOUNTER — Encounter: Payer: Self-pay | Admitting: Cardiovascular Disease

## 2021-04-12 ENCOUNTER — Other Ambulatory Visit: Payer: Self-pay

## 2021-04-12 ENCOUNTER — Ambulatory Visit (INDEPENDENT_AMBULATORY_CARE_PROVIDER_SITE_OTHER): Payer: Self-pay | Admitting: Cardiovascular Disease

## 2021-04-12 VITALS — BP 132/68 | HR 70 | Ht 65.0 in | Wt 220.6 lb

## 2021-04-12 DIAGNOSIS — I10 Essential (primary) hypertension: Secondary | ICD-10-CM

## 2021-04-12 DIAGNOSIS — E785 Hyperlipidemia, unspecified: Secondary | ICD-10-CM

## 2021-04-12 DIAGNOSIS — F17200 Nicotine dependence, unspecified, uncomplicated: Secondary | ICD-10-CM

## 2021-04-12 DIAGNOSIS — Z951 Presence of aortocoronary bypass graft: Secondary | ICD-10-CM

## 2021-04-12 NOTE — Assessment & Plan Note (Signed)
History of tobacco abuse which he had stopped at the time of his bypass surgery.  Unfortunately his wife of 21 years died several weeks ago and he has gone back to smoking.

## 2021-04-12 NOTE — Assessment & Plan Note (Signed)
History of CAD status post cardiac catheterization which I performed 05/02/2018 revealing high-grade distal left main disease with segmental proximal LAD disease and right to left collaterals with normal LV function.  He underwent coronary artery bypass grafting by Dr. Darcey Nora the following day with a LIMA to his LAD and a vein to a obtuse marginal branch.  He was discharged home 1 week later.  He currently denies chest pain or shortness of breath.

## 2021-04-12 NOTE — Progress Notes (Signed)
04/12/2021 Jimmy Collins   Feb 16, 1961  [id number]  Primary Physician Dennard Schaumann Cammie Mcgee, MD Primary Cardiologist: Lorretta Harp MD [name], [place]  HPI:  Jimmy Collins is a 61 y.o.  moderately overweight married Caucasian male father of 1 child referred by Dr. Dennard Schaumann for cardiovascular evaluation because of new onset effort angina.  He no longer works and is out on disability..    I last  saw him in the office 10/06/2020. His risk factors include 80-pack-year history tobacco abuse having cut back from 2 packs a day to 1/2 pack a day 2 weeks ago at the request of his PCP.  His risk factors otherwise include treated hypertension, hyperlipidemia and family history with a father who had a myocardial infarction and a brother who is had stents.  His brother is my patient and I placed the stents.  He also has a hiatal hernia with GERD.  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  He has had chest pain for 4 weeks which is typically exertional with pain down both arms and shortness of breath.   I performed cardiac catheterization on him 05/02/2018 revealing high-grade distal left main disease and segmental proximal LAD disease with right to left collaterals and normal LV function.  The following day he underwent CABG x2 by Dr. Darcey Nora with a LIMA to his LAD and a vein to an obtuse marginal branch.  Was discharged home 1 week later.  He feels clinically improved.   Since I saw him 6 months ago he continues to do well.  He denies chest pain or shortness of breath.  Unfortunately, his wife of 21 years died on Mar 31, 2021 of liver failure.  He has gone back to smoking because of the stress and grief.  He lives with his 5 year old son.  Current Meds  Medication Sig   aspirin EC 81 MG tablet Take 81 mg by mouth daily. Swallow whole.   EPINEPHrine (EPIPEN 2-PAK) 0.3 mg/0.3 mL IJ SOAJ injection Inject 0.3 mg into the muscle once as needed (for severe  allergic reaction).   glucose blood (GLUCOSE METER TEST) test strip Use to check fasting blood sugar qam   lansoprazole (PREVACID) 30 MG capsule Take 30 mg by mouth daily.   lisinopril (ZESTRIL) 10 MG tablet Take 1 tablet (10 mg total) by mouth daily.   metFORMIN (GLUCOPHAGE) 1000 MG tablet TAKE 1 TABLET BY MOUTH TWICE DAILY, WITH MEALS   metoprolol succinate (TOPROL-XL) 25 MG 24 hr tablet TAKE 1 TABLET BY MOUTH EVERY DAY   nitroGLYCERIN (NITROSTAT) 0.4 MG SL tablet Place 1 tablet (0.4 mg total) under the tongue every 5 (five) minutes as needed for chest pain.   pioglitazone (ACTOS) 30 MG tablet TAKE 1 TABLET(30 MG) BY MOUTH DAILY   rosuvastatin (CRESTOR) 20 MG tablet TAKE 1 TABLET(20 MG) BY MOUTH DAILY     Allergies  Allergen Reactions   Other Hives, Itching and Swelling    Mammalian Meat Allergy   Heparin Other (See Comments)    Pt did not remember rxn   Shellfish Allergy Hives and Rash    Social History   Socioeconomic History   Marital status: Married    Spouse name: Not on file   Number of children: Not on file   Years of education: Not on file   Highest education level: Not on file  Occupational History   Not on file  Tobacco Use  Smoking status: Former    Packs/day: 1.00    Types: Cigarettes   Smokeless tobacco: Never  Substance and Sexual Activity   Alcohol use: No   Drug use: Never   Sexual activity: Yes  Other Topics Concern   Not on file  Social History Narrative   Not on file   Social Determinants of Health   Financial Resource Strain: Not on file  Food Insecurity: Not on file  Transportation Needs: Not on file  Physical Activity: Not on file  Stress: Not on file  Social Connections: Not on file  Intimate Partner Violence: Not on file     Review of Systems: General: negative for chills, fever, night sweats or weight changes.  Cardiovascular: negative for chest pain, dyspnea on exertion, edema, orthopnea, palpitations, paroxysmal nocturnal dyspnea  or shortness of breath Dermatological: negative for rash Respiratory: negative for cough or wheezing Urologic: negative for hematuria Abdominal: negative for nausea, vomiting, diarrhea, bright red blood per rectum, melena, or hematemesis Neurologic: negative for visual changes, syncope, or dizziness All other systems reviewed and are otherwise negative except as noted above.    Blood pressure 132/68, pulse 70, height 5\' 5"  (1.651 m), weight 220 lb 9.6 oz (100.1 kg), SpO2 96 %.  General appearance: alert and no distress Neck: no adenopathy, no carotid bruit, no JVD, supple, symmetrical, trachea midline, and thyroid not enlarged, symmetric, no tenderness/mass/nodules Lungs: clear to auscultation bilaterally Heart: regular rate and rhythm, S1, S2 normal, no murmur, click, rub or gallop Extremities: extremities normal, atraumatic, no cyanosis or edema Pulses: 2+ and symmetric Skin: Skin color, texture, turgor normal. No rashes or lesions Neurologic: Grossly normal  EKG sinus rhythm at 70 with incomplete right bundle branch block.  This is unchanged from prior EKGs.  I personally reviewed this EKG.  ASSESSMENT AND PLAN:   Dyslipidemia History of hyperlipidemia on Crestor 20 mg a day with lipid profile performed 09/03/2020 revealing total cholesterol 101, LDL 42 and HDL 35.  Smoker History of tobacco abuse which he had stopped at the time of his bypass surgery.  Unfortunately his wife of 21 years died several weeks ago and he has gone back to smoking.  Essential hypertension History of essential hypertension a blood pressure measured today at 132/68.  He is on lisinopril and metoprolol.  S/P CABG x 2 History of CAD status post cardiac catheterization which I performed 05/02/2018 revealing high-grade distal left main disease with segmental proximal LAD disease and right to left collaterals with normal LV function.  He underwent coronary artery bypass grafting by Dr. Darcey Nora the following  day with a LIMA to his LAD and a vein to a obtuse marginal branch.  He was discharged home 1 week later.  He currently denies chest pain or shortness of breath.     Lorretta Harp MD FACP,FACC,FAHA, [facility] 04/12/2021 11:11 AM

## 2021-04-12 NOTE — Patient Instructions (Signed)

## 2021-04-12 NOTE — Assessment & Plan Note (Signed)
History of essential hypertension a blood pressure measured today at 132/68.  He is on lisinopril and metoprolol.

## 2021-04-12 NOTE — Assessment & Plan Note (Signed)
History of hyperlipidemia on Crestor 20 mg a day with lipid profile performed 09/03/2020 revealing total cholesterol 101, LDL 42 and HDL 35.

## 2021-05-25 ENCOUNTER — Other Ambulatory Visit: Payer: Self-pay | Admitting: Family Medicine

## 2021-07-19 LAB — HM DIABETES EYE EXAM

## 2021-08-24 ENCOUNTER — Other Ambulatory Visit: Payer: Self-pay | Admitting: Family Medicine

## 2021-08-24 NOTE — Telephone Encounter (Signed)
Refilled 11/03/2020 #90 3 refills - 1 year. Requested Prescriptions  Pending Prescriptions Disp Refills  . rosuvastatin (CRESTOR) 20 MG tablet [Pharmacy Med Name: ROSUVASTATIN 20MG  TABLETS] 90 tablet 3    Sig: TAKE 1 TABLET(20 MG) BY MOUTH DAILY     There is no refill protocol information for this order

## 2021-11-02 ENCOUNTER — Other Ambulatory Visit: Payer: Self-pay | Admitting: Family Medicine

## 2021-11-02 NOTE — Telephone Encounter (Signed)
Pls call pt to set up annual w/pcp for future refills

## 2021-11-19 ENCOUNTER — Other Ambulatory Visit: Payer: Self-pay | Admitting: Family Medicine

## 2021-11-21 ENCOUNTER — Other Ambulatory Visit: Payer: Self-pay | Admitting: Family Medicine

## 2021-11-21 NOTE — Telephone Encounter (Signed)
Please call pt for physical w/pcp for future refills

## 2021-11-23 NOTE — Telephone Encounter (Signed)
Requested medication (s) are due for refill today: yes  Requested medication (s) are on the active medication list: yes  Last refill:  11/03/2020 #90 with 3 RF  Future visit scheduled: no, last seen 09/03/20  Notes to clinic:  Failed protocol of labs within 12 months, labs 08/2020, no  upcoming appt, please assess.    Requested Prescriptions  Pending Prescriptions Disp Refills   rosuvastatin (CRESTOR) 20 MG tablet [Pharmacy Med Name: ROSUVASTATIN 20MG  TABLETS] 90 tablet 3    Sig: TAKE 1 TABLET(20 MG) BY MOUTH DAILY     Cardiovascular:  Antilipid - Statins 2 Failed - 11/21/2021  5:12 PM      Failed - Cr in normal range and within 360 days    Creat  Date Value Ref Range Status  09/03/2020 0.95 0.70 - 1.25 mg/dL Final    Comment:    For patients >16 years of age, the reference limit for Creatinine is approximately 13% higher for people identified as African-American. .          Failed - Valid encounter within last 12 months    Recent Outpatient Visits           1 year ago S/P CABG x 2   [facility] Medicine [place], [facility], MD   2 years ago Uncontrolled diabetes mellitus with circulatory complication, without long-term current use of insulin (HCC)   [facility] Medicine [place], [facility], MD   2 years ago Weight loss   [facility] Medicine [facility], MD   2 years ago Low back pain at multiple sites   [facility] Medicine [place], [facility], MD   3 years ago Hospital discharge follow-up   [facility] Medicine [facility], MD              Failed - Lipid Panel in normal range within the last 12 months    Cholesterol, Total  Date Value Ref Range Status  12/24/2018 132 100 - 199 mg/dL Final   Cholesterol  Date Value Ref Range Status  09/03/2020 101 <200 mg/dL Final   LDL Cholesterol (Calc)  Date Value Ref Range Status  09/03/2020 42 mg/dL (calc) Final    Comment:    Reference range: <100 . Desirable range  <100 mg/dL for primary prevention;   <70 mg/dL for patients with CHD or diabetic patients  with > or = 2 CHD risk factors. 09/05/2020 LDL-C is now calculated using the Martin-Hopkins  calculation, which is a validated novel method providing  better accuracy than the Friedewald equation in the  estimation of LDL-C.  Marland Kitchen et al. Horald Pollen. Lenox Ahr): 2061-2068  (http://education.[facility].com/faq/FAQ164)    HDL  Date Value Ref Range Status  09/03/2020 35 (L) > OR = 40 mg/dL Final  09/05/2020 36 (L) >39 mg/dL Final   Triglycerides  Date Value Ref Range Status  09/03/2020 153 (H) <150 mg/dL Final         Passed - Patient is not pregnant

## 2021-11-25 ENCOUNTER — Other Ambulatory Visit: Payer: Self-pay | Admitting: Family Medicine

## 2021-11-25 NOTE — Telephone Encounter (Signed)
Pls called pt for physical w/pcp for med eval/future refills

## 2022-01-16 ENCOUNTER — Other Ambulatory Visit: Payer: Self-pay | Admitting: Family Medicine

## 2022-01-26 ENCOUNTER — Ambulatory Visit (INDEPENDENT_AMBULATORY_CARE_PROVIDER_SITE_OTHER): Payer: Medicare Other | Admitting: Family Medicine

## 2022-01-26 VITALS — BP 136/82 | HR 69 | Ht 65.0 in | Wt 216.2 lb

## 2022-01-26 DIAGNOSIS — I10 Essential (primary) hypertension: Secondary | ICD-10-CM | POA: Diagnosis not present

## 2022-01-26 DIAGNOSIS — E118 Type 2 diabetes mellitus with unspecified complications: Secondary | ICD-10-CM | POA: Diagnosis not present

## 2022-01-26 DIAGNOSIS — Z951 Presence of aortocoronary bypass graft: Secondary | ICD-10-CM

## 2022-01-26 LAB — CBC WITH DIFFERENTIAL/PLATELET
Absolute Monocytes: 697 cells/uL (ref 200–950)
Basophils Absolute: 129 cells/uL (ref 0–200)
Basophils Relative: 1.5 %
Eosinophils Absolute: 413 cells/uL (ref 15–500)
Eosinophils Relative: 4.8 %
HCT: 45.6 % (ref 38.5–50.0)
Hemoglobin: 15.3 g/dL (ref 13.2–17.1)
Lymphs Abs: 3492 cells/uL (ref 850–3900)
MCH: 28.5 pg (ref 27.0–33.0)
MCHC: 33.6 g/dL (ref 32.0–36.0)
MCV: 85.1 fL (ref 80.0–100.0)
MPV: 10.8 fL (ref 7.5–12.5)
Monocytes Relative: 8.1 %
Neutro Abs: 3870 cells/uL (ref 1500–7800)
Neutrophils Relative %: 45 %
Platelets: 263 10*3/uL (ref 140–400)
RBC: 5.36 10*6/uL (ref 4.20–5.80)
RDW: 13.5 % (ref 11.0–15.0)
Total Lymphocyte: 40.6 %
WBC: 8.6 10*3/uL (ref 3.8–10.8)

## 2022-01-26 LAB — LIPID PANEL
Cholesterol: 112 mg/dL (ref <200)
HDL: 42 mg/dL (ref >=40)
LDL Cholesterol (Calc): 46 mg/dL (calc)
Non-HDL Cholesterol (Calc): 70 mg/dL (calc) (ref <130)
Total CHOL/HDL Ratio: 2.7 (calc) (ref <5.0)
Triglycerides: 165 mg/dL — ABNORMAL HIGH (ref <150)

## 2022-01-26 LAB — HEMOGLOBIN A1C
Hgb A1c MFr Bld: 5.9 % of total Hgb — ABNORMAL HIGH (ref <5.7)
Mean Plasma Glucose: 123 mg/dL
eAG (mmol/L): 6.8 mmol/L

## 2022-01-26 LAB — COMPLETE METABOLIC PANEL WITH GFR
AG Ratio: 1.8 (calc) (ref 1.0–2.5)
ALT: 20 U/L (ref 9–46)
AST: 19 U/L (ref 10–35)
Albumin: 4.4 g/dL (ref 3.6–5.1)
Alkaline phosphatase (APISO): 41 U/L (ref 35–144)
BUN: 7 mg/dL (ref 7–25)
CO2: 29 mmol/L (ref 20–32)
Calcium: 9.7 mg/dL (ref 8.6–10.3)
Chloride: 102 mmol/L (ref 98–110)
Creat: 0.95 mg/dL (ref 0.70–1.35)
Globulin: 2.5 g/dL (calc) (ref 1.9–3.7)
Glucose, Bld: 88 mg/dL (ref 65–99)
Potassium: 4.2 mmol/L (ref 3.5–5.3)
Sodium: 139 mmol/L (ref 135–146)
Total Bilirubin: 0.4 mg/dL (ref 0.2–1.2)
Total Protein: 6.9 g/dL (ref 6.1–8.1)
eGFR: 91 mL/min/{1.73_m2} (ref >=60)

## 2022-01-26 LAB — PROTEIN / CREATININE RATIO, URINE
Creatinine, Urine: 161 mg/dL (ref 20–320)
Protein/Creat Ratio: 62 mg/g creat (ref 25–148)
Protein/Creatinine Ratio: 0.062 mg/mg creat (ref 0.025–0.148)
Total Protein, Urine: 10 mg/dL (ref 5–25)

## 2022-01-26 NOTE — Progress Notes (Signed)
Subjective:    Patient ID: Jimmy Collins, male    DOB: 1961/01/23, 61 y.o.   MRN: [id number]  HPI  Patient is a 61 year old Caucasian male with a history of diabetes mellitus, dyslipidemia, hypertension, and coronary artery disease.  He has not been seen in over a year.  He is here today for a checkup.  He is currently taking metoprolol and rosuvastatin along with Actos and metformin.  He has occasional hypoglycemic episodes that respond soon as he eats.  He states that this occurs once a month.  He occasionally forgets to take the metformin in the evening.  He denies any chest pain shortness of breath or dyspnea on exertion.  He denies any orthopnea or paroxysmal nocturnal dyspnea. 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  Alpha-gal allergy   Diabetes mellitus without complication (HCC)    Dyslipidemia    GERD (gastroesophageal reflux disease)    Hypertension    Kidney stones    [facility] spotted fever    Smoker    Past Surgical History:  Procedure Laterality Date   CORONARY ARTERY BYPASS GRAFT N/A 05/03/2018   Procedure: CORONARY ARTERY BYPASS GRAFTING (CABG), ON PUMP, TIMES TWO, USING LEFT INTERNAL MAMMARY ARTERY AND ENDOSCOPICALLY HARVESTED LEFT GREATER SAPHENOUS VEIN;  Surgeon: Kerin Perna, MD;  Location: [facility] OR;  Service: Open Heart Surgery;  Laterality: N/A;   KIDNEY STONE SURGERY     bilateral   KNEE SURGERY     left   LEFT HEART CATH AND CORONARY ANGIOGRAPHY N/A 05/02/2018   Procedure: LEFT HEART CATH AND CORONARY ANGIOGRAPHY;  Surgeon: Runell Gess, MD;  Location: [facility] INVASIVE CV LAB;  Service: Cardiovascular;  Laterality: N/A;   TEE WITHOUT CARDIOVERSION N/A 05/03/2018   Procedure: TRANSESOPHAGEAL ECHOCARDIOGRAM (TEE);  Surgeon: Donata Clay, Theron Arista, MD;  Location: [facility] OR;  Service: Open Heart Surgery;  Laterality: N/A;   Current Outpatient Medications on File Prior to Visit  Medication Sig  Dispense Refill   aspirin EC 81 MG tablet Take 81 mg by mouth daily. Swallow whole.     EPINEPHrine (EPIPEN 2-PAK) 0.3 mg/0.3 mL IJ SOAJ injection Inject 0.3 mg into the muscle once as needed (for severe allergic reaction). 1 each 1   glucose blood (GLUCOSE METER TEST) test strip Use to check fasting blood sugar qam 100 each 12   lansoprazole (PREVACID) 30 MG capsule Take 30 mg by mouth daily.     metFORMIN (GLUCOPHAGE) 1000 MG tablet TAKE 1 TABLET BY MOUTH TWICE DAILY WITH MEALS 60 tablet 0   metoprolol succinate (TOPROL-XL) 25 MG 24 hr tablet TAKE 1 TABLET BY MOUTH EVERY DAY 90 tablet 3   nitroGLYCERIN (NITROSTAT) 0.4 MG SL tablet Place 1 tablet (0.4 mg total) under the tongue every 5 (five) minutes as needed for chest pain. 50 tablet 3   pioglitazone (ACTOS) 30 MG tablet TAKE 1 TABLET(30 MG) BY MOUTH DAILY 90 tablet 3   rosuvastatin (CRESTOR) 20 MG tablet TAKE 1 TABLET(20 MG) BY MOUTH DAILY 30 tablet 0   lisinopril (ZESTRIL) 10 MG tablet TAKE 1 TABLET(10 MG) BY MOUTH DAILY (Patient not taking: Reported on 01/26/2022) 30 tablet 0   No current facility-administered medications on file prior to visit.   Allergies  Allergen Reactions   Other Hives, Itching and Swelling    Mammalian Meat Allergy   Heparin Other (See Comments)    Pt did not  remember rxn   Shellfish Allergy Hives and Rash   Social History   Socioeconomic History   Marital status: Married    Spouse name: Not on file   Number of children: Not on file   Years of education: Not on file   Highest education level: Not on file  Occupational History   Not on file  Tobacco Use   Smoking status: Former    Packs/day: 1.00    Types: Cigarettes   Smokeless tobacco: Never  Substance and Sexual Activity   Alcohol use: No   Drug use: Never   Sexual activity: Yes  Other Topics Concern   Not on file  Social History Narrative   Not on file   Social Determinants of Health   Financial Resource Strain: Not on file  Food  Insecurity: Not on file  Transportation Needs: Not on file  Physical Activity: Not on file  Stress: Not on file  Social Connections: Not on file  Intimate Partner Violence: Not on file      Review of Systems     Objective:   Physical Exam Vitals reviewed.  Constitutional:      Appearance: Normal appearance. He is obese.  Cardiovascular:     Rate and Rhythm: Normal rate and regular rhythm.     Heart sounds: Normal heart sounds. No murmur heard.    No friction rub. No gallop.  Pulmonary:     Effort: Pulmonary effort is normal. No respiratory distress.     Breath sounds: Normal breath sounds. No wheezing, rhonchi or rales.  Abdominal:     General: Abdomen is flat. Bowel sounds are normal. There is no distension.     Palpations: Abdomen is soft. There is no mass.     Tenderness: There is no abdominal tenderness. There is no guarding or rebound.     Hernia: No hernia is present.  Musculoskeletal:     Right lower leg: No edema.     Left lower leg: No edema.  Neurological:     General: No focal deficit present.     Mental Status: He is alert and oriented to person, place, and time. Mental status is at baseline.     Cranial Nerves: No cranial nerve deficit.     Motor: No weakness.       S/P CABG x 2  Controlled type 2 diabetes mellitus with complication, without long-term current use of insulin (HCC) - Plan: CBC with Differential/Platelet, Lipid panel, COMPLETE METABOLIC PANEL WITH GFR, Hemoglobin A1c, Protein / Creatinine Ratio, Urine  Essential hypertension Patient's blood pressure today is acceptable.  I would like him to start lisinopril 10 mg a day for both renal protection and cardiovascular benefit.  Check a CBC, CMP, lipid panel, A1c, and protein creatinine ratio.  Goal LDL cholesterol is less than 70.  Add Zetia if greater than goal.  Goal A1c is less than 6.5.  Ideally I would like to replace Actos with Jardiance for renal protection as well as cardiovascular  benefit.  Await the results of his lab work.  Patient refuses a flu shot, COVID booster, and a pneumonia vaccine

## 2022-02-15 ENCOUNTER — Other Ambulatory Visit: Payer: Self-pay | Admitting: Family Medicine

## 2022-02-15 NOTE — Telephone Encounter (Signed)
Requested Prescriptions  Pending Prescriptions Disp Refills   rosuvastatin (CRESTOR) 20 MG tablet [Pharmacy Med Name: ROSUVASTATIN 20MG  TABLETS] 90 tablet 0    Sig: TAKE 1 TABLET(20 MG) BY MOUTH DAILY     Cardiovascular:  Antilipid - Statins 2 Failed - 02/15/2022  1:46 PM      Failed - Valid encounter within last 12 months    Recent Outpatient Visits           1 year ago S/P CABG x 2   [facility], [facility]   2 years ago Uncontrolled diabetes mellitus with circulatory complication, without long-term current use of insulin (HCC)   [facility] [place], [facility], MD   2 years ago Weight loss   [facility], MD   3 years ago Low back pain at multiple sites   [facility] Medicine Pickard, [facility], MD   3 years ago Hospital discharge follow-up   [facility], MD       Future Appointments             In 1 month Donita Brooks, Allyson Sabal, MD [facility] [place]   In 5 months Pickard, [employer], MD [facility]            Failed - Lipid Panel in normal range within the last 12 months    Cholesterol, Total  Date Value Ref Range Status  12/24/2018 132 100 - 199 mg/dL Final   Cholesterol  Date Value Ref Range Status  01/26/2022 112 <200 mg/dL Final   LDL Cholesterol (Calc)  Date Value Ref Range Status  01/26/2022 46 mg/dL (calc) Final    Comment:    Reference range: <100 . Desirable range <100 mg/dL for primary prevention;   <70 mg/dL for patients with CHD or diabetic patients  with > or = 2 CHD risk factors. 01/28/2022 LDL-C is now calculated using the Martin-Hopkins  calculation, which is a validated novel method providing  better accuracy than the Friedewald equation in the  estimation of LDL-C.  Marland Kitchen et al. Horald Pollen. Lenox Ahr): 2061-2068  (http://education.[employer].com/faq/FAQ164)     HDL  Date Value Ref Range Status  01/26/2022 42 > OR = 40 mg/dL Final  01/28/2022 36 (L) >39 mg/dL Final   Triglycerides  Date Value Ref Range Status  01/26/2022 165 (H) <150 mg/dL Final         Passed - Cr in normal range and within 360 days    Creat  Date Value Ref Range Status  01/26/2022 0.95 0.70 - 1.35 mg/dL Final   Creatinine, Urine  Date Value Ref Range Status  01/26/2022 161 20 - 320 mg/dL Final         Passed - Patient is not pregnant       lisinopril (ZESTRIL) 10 MG tablet [Pharmacy Med Name: LISINOPRIL 10MG  TABLETS] 90 tablet 0    Sig: TAKE 1 TABLET(10 MG) BY MOUTH DAILY. NEED APPOINTMENT WITH PRESCRIBER FOR FUTURE REFILLS.     Cardiovascular:  ACE Inhibitors Failed - 02/15/2022  1:46 PM      Failed - Valid encounter within last 6 months    Recent Outpatient Visits           1 year ago S/P CABG x 2   [facility] Medicine [place]ard, 14/08/2021, MD   2 years ago  Uncontrolled diabetes mellitus with circulatory complication, without long-term current use of insulin (HCC)   [facility] Medicine Pickard, Priscille Heidelberg, MD   2 years ago Weight loss   [facility] Medicine Donita Brooks, MD   3 years ago Low back pain at multiple sites   [facility] Medicine [place]ard, Priscille Heidelberg, MD   3 years ago Hospital discharge follow-up   [facility] Medicine Donita Brooks, MD       Future Appointments             In 1 month Allyson Sabal, Delton See, MD [facility] [place]   In 5 months Pickard, Priscille Heidelberg, MD [facility]            Passed - Cr in normal range and within 180 days    Creat  Date Value Ref Range Status  01/26/2022 0.95 0.70 - 1.35 mg/dL Final   Creatinine, Urine  Date Value Ref Range Status  01/26/2022 161 20 - 320 mg/dL Final         Passed - K in normal range and within 180 days    Potassium  Date Value Ref Range Status  01/26/2022 4.2 3.5 -  5.3 mmol/L Final         Passed - Patient is not pregnant      Passed - Last BP in normal range    BP Readings from Last 1 Encounters:  01/26/22 136/82

## 2022-04-04 ENCOUNTER — Ambulatory Visit: Payer: Medicare Other | Attending: Cardiovascular Disease | Admitting: Cardiovascular Disease

## 2022-04-04 ENCOUNTER — Encounter: Payer: Self-pay | Admitting: Cardiovascular Disease

## 2022-04-04 VITALS — BP 134/80 | HR 68 | Ht 65.0 in | Wt 209.4 lb

## 2022-04-04 DIAGNOSIS — Z951 Presence of aortocoronary bypass graft: Secondary | ICD-10-CM

## 2022-04-04 DIAGNOSIS — I10 Essential (primary) hypertension: Secondary | ICD-10-CM

## 2022-04-04 DIAGNOSIS — E785 Hyperlipidemia, unspecified: Secondary | ICD-10-CM

## 2022-04-04 NOTE — Progress Notes (Signed)
04/04/2022 Jimmy Collins   [birth year]  [id number]  Primary Physician Dennard Schaumann Cammie Mcgee, MD Primary Cardiologist: Lorretta Harp MD [name], [place]  HPI:  Jimmy Collins is a 62 y.o.   moderately overweight married Caucasian male father of 1 child referred by Dr. Dennard Schaumann for cardiovascular evaluation because of new onset effort angina.  He no longer works and is out on disability..  I last  saw him in the office 04/12/2021. His risk factors include 80-pack-year history tobacco abuse having cut back from 2 packs a day to 1/2 pack a day 2 weeks ago at the request of his PCP.  His risk factors otherwise include treated hypertension, hyperlipidemia and family history with a father who had a myocardial infarction and a brother who is had stents.  His brother is my patient and I placed the stents.  He also has a hiatal hernia with GERD.  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  He has had chest pain for 4 weeks which is typically exertional with pain down both arms and shortness of breath.   I performed cardiac catheterization on him 05/02/2018 revealing high-grade distal left main disease and segmental proximal LAD disease with right to left collaterals and normal LV function.  The following day he underwent CABG x2 by Dr. Darcey Nora with a LIMA to his LAD and a vein to an obtuse marginal branch.  Was discharged home 1 week later.  He feels clinically improved.   Unfortunately, his wife of 21 years died on 04/07/21 of liver failure.  He had gone back to smoking because of the stress and grief.  He stopped working 8 months ago.  He lives with his 58 year old son.  Since I saw him a year ago he continues to do well.  He has lost 10 pounds.  He denies chest pain or shortness of breath.  He is slowly recovering from the loss of his wife.     Current Meds  Medication Sig   aspirin EC 81 MG tablet Take 81 mg by mouth daily. Swallow whole.    EPINEPHrine (EPIPEN 2-PAK) 0.3 mg/0.3 mL IJ SOAJ injection Inject 0.3 mg into the muscle once as needed (for severe allergic reaction).   glucose blood (GLUCOSE METER TEST) test strip Use to check fasting blood sugar qam   lansoprazole (PREVACID) 30 MG capsule Take 30 mg by mouth daily.   lisinopril (ZESTRIL) 10 MG tablet TAKE 1 TABLET(10 MG) BY MOUTH DAILY. NEED APPOINTMENT WITH PRESCRIBER FOR FUTURE REFILLS.   metFORMIN (GLUCOPHAGE) 1000 MG tablet TAKE 1 TABLET BY MOUTH TWICE DAILY WITH MEALS   metoprolol succinate (TOPROL-XL) 25 MG 24 hr tablet TAKE 1 TABLET BY MOUTH EVERY DAY   nitroGLYCERIN (NITROSTAT) 0.4 MG SL tablet Place 1 tablet (0.4 mg total) under the tongue every 5 (five) minutes as needed for chest pain.   pioglitazone (ACTOS) 30 MG tablet TAKE 1 TABLET(30 MG) BY MOUTH DAILY   rosuvastatin (CRESTOR) 20 MG tablet TAKE 1 TABLET(20 MG) BY MOUTH DAILY     Allergies  Allergen Reactions   Other Hives, Itching and Swelling    Mammalian Meat Allergy   Heparin Other (See Comments)    Pt did not remember rxn   Shellfish Allergy Hives and Rash    Social History   Socioeconomic History   Marital status: Married    Spouse name: Not on file   Number of children: Not  on file   Years of education: Not on file   Highest education level: Not on file  Occupational History   Not on file  Tobacco Use   Smoking status: Former    Packs/day: 1.00    Types: Cigarettes   Smokeless tobacco: Never  Substance and Sexual Activity   Alcohol use: No   Drug use: Never   Sexual activity: Yes  Other Topics Concern   Not on file  Social History Narrative   Not on file   Social Determinants of Health   Financial Resource Strain: Not on file  Food Insecurity: Not on file  Transportation Needs: Not on file  Physical Activity: Not on file  Stress: Not on file  Social Connections: Not on file  Intimate Partner Violence: Not on file     Review of Systems: General: negative for chills,  fever, night sweats or weight changes.  Cardiovascular: negative for chest pain, dyspnea on exertion, edema, orthopnea, palpitations, paroxysmal nocturnal dyspnea or shortness of breath Dermatological: negative for rash Respiratory: negative for cough or wheezing Urologic: negative for hematuria Abdominal: negative for nausea, vomiting, diarrhea, bright red blood per rectum, melena, or hematemesis Neurologic: negative for visual changes, syncope, or dizziness All other systems reviewed and are otherwise negative except as noted above.    Blood pressure 134/80, pulse 68, height 5\' 5"  (1.651 m), weight 209 lb 6.4 oz (95 kg), SpO2 95 %.  General appearance: alert and no distress Neck: no adenopathy, no carotid bruit, no JVD, supple, symmetrical, trachea midline, and thyroid not enlarged, symmetric, no tenderness/mass/nodules Lungs: clear to auscultation bilaterally Heart: regular rate and rhythm, S1, S2 normal, no murmur, click, rub or gallop Extremities: extremities normal, atraumatic, no cyanosis or edema Pulses: 2+ and symmetric Skin: Skin color, texture, turgor normal. No rashes or lesions Neurologic: Grossly normal  EKG not performed today  ASSESSMENT AND PLAN:   Dyslipidemia History of dyslipidemia on statin therapy with lipid profile performed 01/26/2022 revealing total cholesterol 112, LDL 46 and HDL 42.  Essential hypertension History of essential hypertension blood pressure measured at 134/80.  He is on lisinopril and Toprol.  S/P CABG x 2 History of CAD status postcardiac catheterization performed by myself 05/02/2018 revealing high-grade distal left main disease with segmental proximal LAD disease and right to left collaterals with normal LV function.  The following day he underwent CABG x 2 by Dr. Darcey Nora with a LIMA to his LAD and a vein to the marginal branch.  He has done well since and is completely asymptomatic.     Lorretta Harp MD FACP,FACC,FAHA,  [facility] 04/04/2022 9:52 AM

## 2022-04-04 NOTE — Assessment & Plan Note (Signed)
History of essential hypertension blood pressure measured at 134/80.  He is on lisinopril and Toprol.

## 2022-04-04 NOTE — Assessment & Plan Note (Signed)
History of CAD status postcardiac catheterization performed by myself 05/02/2018 revealing high-grade distal left main disease with segmental proximal LAD disease and right to left collaterals with normal LV function.  The following day he underwent CABG x 2 by Dr. Darcey Nora with a LIMA to his LAD and a vein to the marginal branch.  He has done well since and is completely asymptomatic.

## 2022-04-04 NOTE — Patient Instructions (Signed)

## 2022-04-04 NOTE — Assessment & Plan Note (Signed)
History of dyslipidemia on statin therapy with lipid profile performed 01/26/2022 revealing total cholesterol 112, LDL 46 and HDL 42.

## 2022-05-11 ENCOUNTER — Other Ambulatory Visit: Payer: Self-pay | Admitting: Family Medicine

## 2022-05-12 ENCOUNTER — Other Ambulatory Visit: Payer: Self-pay

## 2022-05-12 ENCOUNTER — Telehealth: Payer: Self-pay | Admitting: Family Medicine

## 2022-05-12 DIAGNOSIS — E118 Type 2 diabetes mellitus with unspecified complications: Secondary | ICD-10-CM

## 2022-05-12 DIAGNOSIS — E785 Hyperlipidemia, unspecified: Secondary | ICD-10-CM

## 2022-05-12 DIAGNOSIS — I10 Essential (primary) hypertension: Secondary | ICD-10-CM

## 2022-05-12 MED ORDER — LISINOPRIL 10 MG PO TABS: ORAL_TABLET | ORAL | 1 refills | Status: DC

## 2022-05-12 MED ORDER — METOPROLOL SUCCINATE ER 25 MG PO TB24: 25.0000 mg | ORAL_TABLET | Freq: Every day | ORAL | 3 refills | Status: DC

## 2022-05-12 MED ORDER — METFORMIN HCL 1000 MG PO TABS: ORAL_TABLET | ORAL | 1 refills | Status: DC

## 2022-05-12 MED ORDER — ROSUVASTATIN CALCIUM 20 MG PO TABS: ORAL_TABLET | ORAL | 1 refills | Status: DC

## 2022-05-12 NOTE — Telephone Encounter (Signed)
Patient came to the office to follow up on his recent refill requests; stated the pharmacy denied them. He'd like a call back.  Meds needed:  metFORMIN (GLUCOPHAGE) 1000 MG tablet [id number]   lisinopril (ZESTRIL) 10 MG tablet [id number]   rosuvastatin (CRESTOR) 20 MG tablet [id number]   metoprolol succinate (TOPROL-XL) 25 MG 24 hr tablet [id number]  **PATIENT HAS ABOUT 3-4 DAYS WORTH REMAINING BEFORE HE RUNS OUT**  Pharmacy confirmed as  [facility], [place] - [address] AT [facility] OF [address], [place] [address] Phone: [phone]  Fax: [phone] DEA #: [id number]:[id number]   Please advise patient at [phone].

## 2022-06-08 ENCOUNTER — Ambulatory Visit: Payer: Medicare Other

## 2022-06-30 ENCOUNTER — Other Ambulatory Visit: Payer: Self-pay | Admitting: Family Medicine

## 2022-06-30 DIAGNOSIS — E118 Type 2 diabetes mellitus with unspecified complications: Secondary | ICD-10-CM

## 2022-07-28 ENCOUNTER — Encounter: Payer: Self-pay | Admitting: Family Medicine

## 2022-07-28 ENCOUNTER — Ambulatory Visit (INDEPENDENT_AMBULATORY_CARE_PROVIDER_SITE_OTHER): Payer: Medicare Other | Admitting: Family Medicine

## 2022-07-28 VITALS — BP 115/68 | HR 82 | Temp 97.8°F | Ht 65.0 in | Wt 193.0 lb

## 2022-07-28 DIAGNOSIS — Z7984 Long term (current) use of oral hypoglycemic drugs: Secondary | ICD-10-CM

## 2022-07-28 DIAGNOSIS — E118 Type 2 diabetes mellitus with unspecified complications: Secondary | ICD-10-CM

## 2022-07-28 NOTE — Progress Notes (Signed)
Subjective:    Patient ID: Jimmy Collins, male    DOB: 1961/01/31, 62 y.o.   MRN: [id number]  HPI  Patient is a 62 year old Caucasian male with a history of diabetes mellitus, dyslipidemia, hypertension, and coronary artery disease.  He has not been seen in quite some time.  He denies any chest pain shortness of breath or dyspnea on exertion.  He denies any myalgias or right upper quadrant pain.  He denies any polyuria, polydipsia, or blurry vision.  He denies any neuropathy in his feet.  Diabetic foot exam was performed today and is normal.  Blood pressure today is excellent. 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  Alpha-gal allergy   Diabetes mellitus without complication (HCC)    Dyslipidemia    GERD (gastroesophageal reflux disease)    Hypertension    Kidney stones    [facility] spotted fever    Smoker    Past Surgical History:  Procedure Laterality Date   CORONARY ARTERY BYPASS GRAFT N/A 05/03/2018   Procedure: CORONARY ARTERY BYPASS GRAFTING (CABG), ON PUMP, TIMES TWO, USING LEFT INTERNAL MAMMARY ARTERY AND ENDOSCOPICALLY HARVESTED LEFT GREATER SAPHENOUS VEIN;  Surgeon: Kerin Perna, MD;  Location: [facility] OR;  Service: Open Heart Surgery;  Laterality: N/A;   KIDNEY STONE SURGERY     bilateral   KNEE SURGERY     left   LEFT HEART CATH AND CORONARY ANGIOGRAPHY N/A 05/02/2018   Procedure: LEFT HEART CATH AND CORONARY ANGIOGRAPHY;  Surgeon: Runell Gess, MD;  Location: [facility] INVASIVE CV LAB;  Service: Cardiovascular;  Laterality: N/A;   TEE WITHOUT CARDIOVERSION N/A 05/03/2018   Procedure: TRANSESOPHAGEAL ECHOCARDIOGRAM (TEE);  Surgeon: Donata Clay, Theron Arista, MD;  Location: [facility] OR;  Service: Open Heart Surgery;  Laterality: N/A;   Current Outpatient Medications on File Prior to Visit  Medication Sig Dispense Refill   aspirin EC 81 MG tablet Take 81 mg by mouth daily. Swallow whole.     EPINEPHrine  (EPIPEN 2-PAK) 0.3 mg/0.3 mL IJ SOAJ injection Inject 0.3 mg into the muscle once as needed (for severe allergic reaction). 1 each 1   glucose blood (GLUCOSE METER TEST) test strip Use to check fasting blood sugar qam 100 each 12   lansoprazole (PREVACID) 30 MG capsule Take 30 mg by mouth daily.     lisinopril (ZESTRIL) 10 MG tablet TAKE 1 TABLET(10 MG) BY MOUTH DAILY 90 tablet 1   metFORMIN (GLUCOPHAGE) 1000 MG tablet TAKE 1 TABLET BY MOUTH TWICE DAILY WITH MEALS 60 tablet 3   metoprolol succinate (TOPROL-XL) 25 MG 24 hr tablet Take 1 tablet (25 mg total) by mouth daily. 90 tablet 3   nitroGLYCERIN (NITROSTAT) 0.4 MG SL tablet Place 1 tablet (0.4 mg total) under the tongue every 5 (five) minutes as needed for chest pain. 50 tablet 3   rosuvastatin (CRESTOR) 20 MG tablet TAKE 1 TABLET(20 MG) BY MOUTH DAILY 90 tablet 1   No current facility-administered medications on file prior to visit.   Allergies  Allergen Reactions   Other Hives, Itching and Swelling    Mammalian Meat Allergy   Heparin Other (See Comments)    Pt did not remember rxn   Shellfish Allergy Hives and Rash   Social History   Socioeconomic History   Marital status: Married    Spouse name: Not on file   Number of children: Not on file  Years of education: Not on file   Highest education level: Not on file  Occupational History   Not on file  Tobacco Use   Smoking status: Former    Packs/day: 1    Types: Cigarettes   Smokeless tobacco: Never  Substance and Sexual Activity   Alcohol use: No   Drug use: Never   Sexual activity: Yes  Other Topics Concern   Not on file  Social History Narrative   Not on file   Social Determinants of Health   Financial Resource Strain: Not on file  Food Insecurity: Not on file  Transportation Needs: Not on file  Physical Activity: Not on file  Stress: Not on file  Social Connections: Not on file  Intimate Partner Violence: Not on file      Review of Systems      Objective:   Physical Exam Vitals reviewed.  Constitutional:      Appearance: Normal appearance. He is obese.  Cardiovascular:     Rate and Rhythm: Normal rate and regular rhythm.     Heart sounds: Normal heart sounds. No murmur heard.    No friction rub. No gallop.  Pulmonary:     Effort: Pulmonary effort is normal. No respiratory distress.     Breath sounds: Normal breath sounds. No wheezing, rhonchi or rales.  Abdominal:     General: Abdomen is flat. Bowel sounds are normal. There is no distension.     Palpations: Abdomen is soft. There is no mass.     Tenderness: There is no abdominal tenderness. There is no guarding or rebound.     Hernia: No hernia is present.  Musculoskeletal:     Right lower leg: No edema.     Left lower leg: No edema.  Neurological:     General: No focal deficit present.     Mental Status: He is alert and oriented to person, place, and time. Mental status is at baseline.     Cranial Nerves: No cranial nerve deficit.     Motor: No weakness.       Controlled type 2 diabetes mellitus with complication, without long-term current use of insulin (HCC) - Plan: Hemoglobin A1c, COMPLETE METABOLIC PANEL WITH GFR, Lipid panel, Protein / Creatinine Ratio, Urine Patient's blood pressure today is acceptable.  Will check a fasting lipid panel.  Goal LDL cholesterol is less than 55 given his history of coronary artery disease.  I will check a hemoglobin A1c.  Goal hemoglobin A1c is less than 6.5.  Await the results of his fasting lab work

## 2022-07-29 LAB — PROTEIN / CREATININE RATIO, URINE
Creatinine, Urine: 170 mg/dL (ref 20–320)
Protein/Creat Ratio: 94 mg/g creat (ref 25–148)
Protein/Creatinine Ratio: 0.094 mg/mg creat (ref 0.025–0.148)
Total Protein, Urine: 16 mg/dL (ref 5–25)

## 2022-07-29 LAB — COMPLETE METABOLIC PANEL WITH GFR
AG Ratio: 1.8 (calc) (ref 1.0–2.5)
ALT: 17 U/L (ref 9–46)
AST: 18 U/L (ref 10–35)
Albumin: 4.4 g/dL (ref 3.6–5.1)
Alkaline phosphatase (APISO): 57 U/L (ref 35–144)
BUN: 13 mg/dL (ref 7–25)
CO2: 26 mmol/L (ref 20–32)
Calcium: 10.1 mg/dL (ref 8.6–10.3)
Chloride: 104 mmol/L (ref 98–110)
Creat: 0.85 mg/dL (ref 0.70–1.35)
Globulin: 2.5 g/dL (calc) (ref 1.9–3.7)
Glucose, Bld: 79 mg/dL (ref 65–99)
Potassium: 4.4 mmol/L (ref 3.5–5.3)
Sodium: 139 mmol/L (ref 135–146)
Total Bilirubin: 0.5 mg/dL (ref 0.2–1.2)
Total Protein: 6.9 g/dL (ref 6.1–8.1)
eGFR: 98 mL/min/{1.73_m2} (ref >=60)

## 2022-07-29 LAB — LIPID PANEL
Cholesterol: 101 mg/dL (ref <200)
HDL: 32 mg/dL — ABNORMAL LOW (ref >=40)
LDL Cholesterol (Calc): 44 mg/dL (calc)
Non-HDL Cholesterol (Calc): 69 mg/dL (calc) (ref <130)
Total CHOL/HDL Ratio: 3.2 (calc) (ref <5.0)
Triglycerides: 186 mg/dL — ABNORMAL HIGH (ref <150)

## 2022-07-29 LAB — HEMOGLOBIN A1C
Hgb A1c MFr Bld: 6 % of total Hgb — ABNORMAL HIGH (ref <5.7)
Mean Plasma Glucose: 126 mg/dL
eAG (mmol/L): 7 mmol/L

## 2022-10-25 NOTE — Patient Instructions (Incomplete)
Mr. Jimmy Collins , Thank you for taking time to come for your Medicare Wellness Visit. I appreciate your ongoing commitment to your health goals. Please review the following plan we discussed and let me know if I can assist you in the future.   Referrals/Orders/Follow-Ups/Clinician Recommendations: Aim for 30 minutes of exercise or brisk walking, 6-8 glasses of water, and 5 servings of fruits and vegetables each day.  This is a list of the screening recommended for you and due dates:  Health Maintenance  Topic Date Due   Medicare Annual Wellness Visit  Never done   HIV Screening  Never done   Hepatitis C Screening  Never done   DTaP/Tdap/Td vaccine (1 - Tdap) Never done   Colon Cancer Screening  Never done   Zoster (Shingles) Vaccine (1 of 2) Never done   COVID-19 Vaccine (3 - 2023-24 season) 11/11/2021   Eye exam for diabetics  07/20/2022   Flu Shot  10/12/2022   Hemoglobin A1C  01/28/2023   Yearly kidney function blood test for diabetes  07/28/2023   Yearly kidney health urinalysis for diabetes  07/28/2023   Complete foot exam   07/28/2023   HPV Vaccine  Aged Out    Advanced directives: (ACP Link)Information on Advanced Care Planning can be found at [facility] Advance Health Care Directives Advance Health Care Directives ([email or web address]   Next Medicare Annual Wellness Visit scheduled for next year: Yes   Preventive Care 40-64 Years, Male Preventive care refers to lifestyle choices and visits with your health care provider that can promote health and wellness. What does preventive care include? A yearly physical exam. This is also called an annual well check. Dental exams once or twice a year. Routine eye exams. Ask your health care provider how often you should have your eyes checked. Personal lifestyle choices, including: Daily care of your teeth and gums. Regular physical activity. Eating a healthy diet. Avoiding tobacco and drug use. Limiting alcohol  use. Practicing safe sex. Taking low-dose aspirin every day starting at age 39. What happens during an annual well check? The services and screenings done by your health care provider during your annual well check will depend on your age, overall health, lifestyle risk factors, and family history of disease. Counseling  Your health care provider may ask you questions about your: Alcohol use. Tobacco use. Drug use. Emotional well-being. Home and relationship well-being. Sexual activity. Eating habits. Work and work Astronomer. Screening  You may have the following tests or measurements: Height, weight, and BMI. Blood pressure. Lipid and cholesterol levels. These may be checked every 5 years, or more frequently if you are over 66 years old. Skin check. Lung cancer screening. You may have this screening every year starting at age 1 if you have a 30-pack-year history of smoking and currently smoke or have quit within the past 15 years. Fecal occult blood test (FOBT) of the stool. You may have this test every year starting at age 50. Flexible sigmoidoscopy or colonoscopy. You may have a sigmoidoscopy every 5 years or a colonoscopy every 10 years starting at age 64. Prostate cancer screening. Recommendations will vary depending on your family history and other risks. Hepatitis C blood test. Hepatitis B blood test. Sexually transmitted disease (STD) testing. Diabetes screening. This is done by checking your blood sugar (glucose) after you have not eaten for a while (fasting). You may have this done every 1-3 years. Discuss your test results, treatment options, and if necessary, the need  for more tests with your health care provider. Vaccines  Your health care provider may recommend certain vaccines, such as: Influenza vaccine. This is recommended every year. Tetanus, diphtheria, and acellular pertussis (Tdap, Td) vaccine. You may need a Td booster every 10 years. Zoster vaccine. You may  need this after age 8. Pneumococcal 13-valent conjugate (PCV13) vaccine. You may need this if you have certain conditions and have not been vaccinated. Pneumococcal polysaccharide (PPSV23) vaccine. You may need one or two doses if you smoke cigarettes or if you have certain conditions. Talk to your health care provider about which screenings and vaccines you need and how often you need them. This information is not intended to replace advice given to you by your health care provider. Make sure you discuss any questions you have with your health care provider. Document Released: 03/26/2015 Document Revised: 11/17/2015 Document Reviewed: 12/29/2014 Elsevier Interactive Patient Education  2017 [employer].  Fall Prevention in the Home Falls can cause injuries. They can happen to people of all ages. There are many things you can do to make your home safe and to help prevent falls. What can I do on the outside of my home? Regularly fix the edges of walkways and driveways and fix any cracks. Remove anything that might make you trip as you walk through a door, such as a raised step or threshold. Trim any bushes or trees on the path to your home. Use bright outdoor lighting. Clear any walking paths of anything that might make someone trip, such as rocks or tools. Regularly check to see if handrails are loose or broken. Make sure that both sides of any steps have handrails. Any raised decks and porches should have guardrails on the edges. Have any leaves, snow, or ice cleared regularly. Use sand or salt on walking paths during winter. Clean up any spills in your garage right away. This includes oil or grease spills. What can I do in the bathroom? Use night lights. Install grab bars by the toilet and in the tub and shower. Do not use towel bars as grab bars. Use non-skid mats or decals in the tub or shower. If you need to sit down in the shower, use a plastic, non-slip stool. Keep the floor dry.  Clean up any water that spills on the floor as soon as it happens. Remove soap buildup in the tub or shower regularly. Attach bath mats securely with double-sided non-slip rug tape. Do not have throw rugs and other things on the floor that can make you trip. What can I do in the bedroom? Use night lights. Make sure that you have a light by your bed that is easy to reach. Do not use any sheets or blankets that are too big for your bed. They should not hang down onto the floor. Have a firm chair that has side arms. You can use this for support while you get dressed. Do not have throw rugs and other things on the floor that can make you trip. What can I do in the kitchen? Clean up any spills right away. Avoid walking on wet floors. Keep items that you use a lot in easy-to-reach places. If you need to reach something above you, use a strong step stool that has a grab bar. Keep electrical cords out of the way. Do not use floor polish or wax that makes floors slippery. If you must use wax, use non-skid floor wax. Do not have throw rugs and other things on  the floor that can make you trip. What can I do with my stairs? Do not leave any items on the stairs. Make sure that there are handrails on both sides of the stairs and use them. Fix handrails that are broken or loose. Make sure that handrails are as long as the stairways. Check any carpeting to make sure that it is firmly attached to the stairs. Fix any carpet that is loose or worn. Avoid having throw rugs at the top or bottom of the stairs. If you do have throw rugs, attach them to the floor with carpet tape. Make sure that you have a light switch at the top of the stairs and the bottom of the stairs. If you do not have them, ask someone to add them for you. What else can I do to help prevent falls? Wear shoes that: Do not have high heels. Have rubber bottoms. Are comfortable and fit you well. Are closed at the toe. Do not wear sandals. If  you use a stepladder: Make sure that it is fully opened. Do not climb a closed stepladder. Make sure that both sides of the stepladder are locked into place. Ask someone to hold it for you, if possible. Clearly mark and make sure that you can see: Any grab bars or handrails. First and last steps. Where the edge of each step is. Use tools that help you move around (mobility aids) if they are needed. These include: Canes. Walkers. Scooters. Crutches. Turn on the lights when you go into a dark area. Replace any light bulbs as soon as they burn out. Set up your furniture so you have a clear path. Avoid moving your furniture around. If any of your floors are uneven, fix them. If there are any pets around you, be aware of where they are. Review your medicines with your doctor. Some medicines can make you feel dizzy. This can increase your chance of falling. Ask your doctor what other things that you can do to help prevent falls. This information is not intended to replace advice given to you by your health care provider. Make sure you discuss any questions you have with your health care provider. Document Released: 12/24/2008 Document Revised: 08/05/2015 Document Reviewed: 04/03/2014 Elsevier Interactive Patient Education  2017 [employer].

## 2022-10-25 NOTE — Progress Notes (Unsigned)
Subjective:   Jimmy Collins is a 62 y.o. male who presents for an Initial Medicare Annual Wellness Visit.  Visit Complete: {[phone]}  Patient Medicare AWV questionnaire was completed by the patient on ***; I have confirmed that all information answered by patient is correct and no changes since this date.  Review of Systems    ***       Objective:    There were no vitals filed for this visit. There is no height or weight on file to calculate BMI.     05/03/2018    2:31 PM 05/02/2018   12:53 PM 07/31/2014    9:05 PM 12/30/2013   11:37 PM  Advanced Directives  Does Patient Have a Medical Advance Directive? No No No No  Would patient like information on creating a medical advance directive? No - Patient declined No - Patient declined No - patient declined information No - patient declined information    Current Medications (verified) Outpatient Encounter Medications as of 10/26/2022  Medication Sig   aspirin EC 81 MG tablet Take 81 mg by mouth daily. Swallow whole.   EPINEPHrine (EPIPEN 2-PAK) 0.3 mg/0.3 mL IJ SOAJ injection Inject 0.3 mg into the muscle once as needed (for severe allergic reaction).   glucose blood (GLUCOSE METER TEST) test strip Use to check fasting blood sugar qam   lansoprazole (PREVACID) 30 MG capsule Take 30 mg by mouth daily.   lisinopril (ZESTRIL) 10 MG tablet TAKE 1 TABLET(10 MG) BY MOUTH DAILY   metFORMIN (GLUCOPHAGE) 1000 MG tablet TAKE 1 TABLET BY MOUTH TWICE DAILY WITH MEALS   metoprolol succinate (TOPROL-XL) 25 MG 24 hr tablet Take 1 tablet (25 mg total) by mouth daily.   nitroGLYCERIN (NITROSTAT) 0.4 MG SL tablet Place 1 tablet (0.4 mg total) under the tongue every 5 (five) minutes as needed for chest pain.   rosuvastatin (CRESTOR) 20 MG tablet TAKE 1 TABLET(20 MG) BY MOUTH DAILY   No facility-administered encounter medications on file as of 10/26/2022.    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  Alpha-gal allergy   Diabetes mellitus without complication (HCC)    Dyslipidemia    GERD (gastroesophageal reflux disease)    Hypertension    Kidney stones    [facility] spotted fever    Smoker    Past Surgical History:  Procedure Laterality Date   CORONARY ARTERY BYPASS GRAFT N/A 05/03/2018   Procedure: CORONARY ARTERY BYPASS GRAFTING (CABG), ON PUMP, TIMES TWO, USING LEFT INTERNAL MAMMARY ARTERY AND ENDOSCOPICALLY HARVESTED LEFT GREATER SAPHENOUS VEIN;  Surgeon: Kerin Perna, MD;  Location: [facility] OR;  Service: Open Heart Surgery;  Laterality: N/A;   KIDNEY STONE SURGERY     bilateral   KNEE SURGERY     left   LEFT HEART CATH AND CORONARY ANGIOGRAPHY N/A 05/02/2018   Procedure: LEFT HEART CATH AND CORONARY ANGIOGRAPHY;  Surgeon: Runell Gess, MD;  Location: [facility] INVASIVE CV LAB;  Service: Cardiovascular;  Laterality: N/A;   TEE WITHOUT CARDIOVERSION N/A 05/03/2018   Procedure: TRANSESOPHAGEAL ECHOCARDIOGRAM (TEE);  Surgeon: Donata Clay, Theron Arista, MD;  Location: [facility] OR;  Service: Open Heart Surgery;  Laterality: N/A;   No family history on file. Social History   Socioeconomic History   Marital status: Married    Spouse name: Not on file   Number of children:  Not on file   Years of education: Not on file   Highest education level: Not on file  Occupational History   Not on file  Tobacco Use   Smoking status: Former    Current packs/day: 1.00    Types: Cigarettes   Smokeless tobacco: Never  Substance and Sexual Activity   Alcohol use: No   Drug use: Never   Sexual activity: Yes  Other Topics Concern   Not on file  Social History Narrative   Not on file   Social Determinants of Health   Financial Resource Strain: Not on file  Food Insecurity: Not on file  Transportation Needs: Not on file  Physical Activity: Not on file  Stress: Not on  file  Social Connections: Unknown (07/26/2021)   Received [facility]   Social Network    Social Network: Not on file    Tobacco Counseling Counseling given: Not Answered   Clinical Intake:                        Activities of Daily Living     No data to display          Patient Care Team: Donita Brooks, MD as PCP - General (Family Medicine) Runell Gess, MD as PCP - Cardiology (Cardiology) Erroll Luna, [facility] (Inactive) as Pharmacist (Pharmacist)  Indicate any recent Medical Services you may have received from other than [facility] providers in the past year (date may be approximate).     Assessment:   This is a routine wellness examination for Jimmy Collins.  Hearing/Vision screen No results found.  Dietary issues and exercise activities discussed:     Goals Addressed   None    Depression Screen    01/26/2022   12:01 PM 09/03/2020    8:46 AM 01/28/2018    8:27 AM  PHQ 2/9 Scores  PHQ - 2 Score 0 5 0  PHQ- 9 Score  14     Fall Risk    01/26/2022   12:01 PM 09/03/2020    8:47 AM 01/28/2018    8:27 AM  Fall Risk   Falls in the past year? 0 0 0  Number falls in past yr: 0 0   Injury with Fall? 0 0   Risk for fall due to : No Fall Risks No Fall Risks   Follow up Falls prevention discussed Falls evaluation completed Falls evaluation completed    MEDICARE RISK AT HOME:   TIMED UP AND GO:  Was the test performed? No    Cognitive Function:        Immunizations Immunization History  Administered Date(s) Administered   PFIZER(Purple Top)SARS-COV-2 Vaccination 08/09/2019, 08/30/2019    {TDAP status:2101805}  {Flu Vaccine status:2101806}  {Pneumococcal vaccine status:2101807}  {Covid-19 vaccine status:2101808}  Qualifies for Shingles Vaccine? Yes   Zostavax completed No   Shingrix Completed?: No.    Education has been provided regarding the importance of this vaccine. Patient has been advised to call insurance  company to determine out of pocket expense if they have not yet received this vaccine. Advised may also receive vaccine at local pharmacy or Health Dept. Verbalized acceptance and understanding.  Screening Tests Health Maintenance  Topic Date Due   Medicare Annual Wellness (AWV)  Never done   HIV Screening  Never done   Hepatitis C Screening  Never done   DTaP/Tdap/Td (1 - Tdap) Never done   Colonoscopy  Never done   Zoster  Vaccines- Shingrix (1 of 2) Never done   COVID-19 Vaccine (3 - 2023-24 season) 11/11/2021   OPHTHALMOLOGY EXAM  07/20/2022   INFLUENZA VACCINE  10/12/2022   HEMOGLOBIN A1C  01/28/2023   Diabetic kidney evaluation - eGFR measurement  07/28/2023   Diabetic kidney evaluation - Urine ACR  07/28/2023   FOOT EXAM  07/28/2023   HPV VACCINES  Aged Out    Health Maintenance  Health Maintenance Due  Topic Date Due   Medicare Annual Wellness (AWV)  Never done   HIV Screening  Never done   Hepatitis C Screening  Never done   DTaP/Tdap/Td (1 - Tdap) Never done   Colonoscopy  Never done   Zoster Vaccines- Shingrix (1 of 2) Never done   COVID-19 Vaccine (3 - 2023-24 season) 11/11/2021   OPHTHALMOLOGY EXAM  07/20/2022   INFLUENZA VACCINE  10/12/2022    {Colorectal cancer screening:2101809}  Lung Cancer Screening: (Low Dose CT Chest recommended if Age 29-80 years, 20 pack-year currently smoking OR have quit w/in 15years.) does not qualify.   Lung Cancer Screening Referral: n/a  Additional Screening:  Hepatitis C Screening: {DOES NOT does:27190::"does not"} qualify; Completed ***  Vision Screening: Recommended annual ophthalmology exams for early detection of glaucoma and other disorders of the eye. Is the patient up to date with their annual eye exam?  {YES/NO:21197} Who is the provider or what is the name of the office in which the patient attends annual eye exams? *** If pt is not established with a provider, would they like to be referred to a provider to  establish care? {YES/NO:21197}.   Dental Screening: Recommended annual dental exams for proper oral hygiene  Diabetic Foot Exam: Diabetic Foot Exam: Completed 07/28/22  Community Resource Referral / Chronic Care Management: CRR required this visit?  {YES/NO:21197}  CCM required this visit?  {CCM Required choices:[phone]}    Plan:     I have personally reviewed and noted the following in the patient's chart:   Medical and social history Use of alcohol, tobacco or illicit drugs  Current medications and supplements including opioid prescriptions. {Opioid Prescriptions:[phone]} Functional ability and status Nutritional status Physical activity Advanced directives List of other physicians Hospitalizations, surgeries, and ER visits in previous 12 months Vitals Screenings to include cognitive, depression, and falls Referrals and appointments  In addition, I have reviewed and discussed with patient certain preventive protocols, quality metrics, and best practice recommendations. A written personalized care plan for preventive services as well as general preventive health recommendations were provided to patient.     Kandis Fantasia [place], [place]   8/46/9629   After Visit Summary: {CHL AMB AWV After Visit Summary:[id number]}  Nurse Notes: ***

## 2022-10-26 ENCOUNTER — Ambulatory Visit (INDEPENDENT_AMBULATORY_CARE_PROVIDER_SITE_OTHER): Payer: Medicare Other

## 2022-10-26 VITALS — BP 126/74 | Ht 65.0 in | Wt 188.0 lb

## 2022-10-26 DIAGNOSIS — Z0000 Encounter for general adult medical examination without abnormal findings: Secondary | ICD-10-CM | POA: Diagnosis not present

## 2022-10-26 MED ORDER — EPINEPHRINE 0.3 MG/0.3ML IJ SOAJ: 0.3000 mg | Freq: Once | INTRAMUSCULAR | 0 refills | Status: DC | PRN

## 2022-12-25 ENCOUNTER — Other Ambulatory Visit: Payer: Self-pay | Admitting: Family Medicine

## 2022-12-25 DIAGNOSIS — E118 Type 2 diabetes mellitus with unspecified complications: Secondary | ICD-10-CM

## 2022-12-26 NOTE — Telephone Encounter (Signed)
Last OV 07/28/22 within protocol.  Requested Prescriptions  Pending Prescriptions Disp Refills   metFORMIN (GLUCOPHAGE) 1000 MG tablet [Pharmacy Med Name: METFORMIN 1000MG  TABLETS] 180 tablet 0    Sig: TAKE 1 TABLET BY MOUTH TWICE DAILY WITH MEALS     Endocrinology:  Diabetes - Biguanides Failed - 12/25/2022  8:23 AM      Failed - B12 Level in normal range and within 720 days    No results found for: "VITAMINB12"       Failed - Valid encounter within last 6 months    Recent Outpatient Visits           2 years ago S/P CABG x 2   [facility] Donita Brooks, MD   3 years ago Uncontrolled diabetes mellitus with circulatory complication, without long-term current use of insulin (HCC)   [facility] Pickard, Priscille Heidelberg, MD   3 years ago Weight loss   [facility] Pickard, Priscille Heidelberg, MD   3 years ago Low back pain at multiple sites   [facility] Pickard, Priscille Heidelberg, MD   4 years ago Hospital discharge follow-up   [facility] Medicine Donita Brooks, MD       Future Appointments             In 3 months Allyson Sabal Delton See, MD [facility] - Cr in normal range and within 360 days    Creat  Date Value Ref Range Status  07/28/2022 0.85 0.70 - 1.35 mg/dL Final   Creatinine, Urine  Date Value Ref Range Status  07/28/2022 170 20 - 320 mg/dL Final         Passed - HBA1C is between 0 and 7.9 and within 180 days    Hgb A1c MFr Bld  Date Value Ref Range Status  07/28/2022 6.0 (H) <5.7 % of total Hgb Final    Comment:    For someone without known diabetes, a hemoglobin  A1c value between 5.7% and 6.4% is consistent with prediabetes and should be confirmed with a  follow-up test. . For someone with known diabetes, a value <7% indicates that their diabetes is well controlled. A1c targets should be individualized based on duration of diabetes, age, comorbid  conditions, and other considerations. . This assay result is consistent with an increased risk of diabetes. . Currently, no consensus exists regarding use of hemoglobin A1c for diagnosis of diabetes for children. .          Passed - eGFR in normal range and within 360 days    GFR, Est African American  Date Value Ref Range Status  09/03/2020 100 > OR = 60 mL/min/1.53m2 Final   GFR, Est Non African American  Date Value Ref Range Status  09/03/2020 87 > OR = 60 mL/min/1.82m2 Final   eGFR  Date Value Ref Range Status  07/28/2022 98 > OR = 60 mL/min/1.82m2 Final         Passed - CBC within normal limits and completed in the last 12 months    WBC  Date Value Ref Range Status  01/26/2022 8.6 3.8 - 10.8 Thousand/uL Final   RBC  Date Value Ref Range Status  01/26/2022 5.36 4.20 - 5.80 Million/uL Final   Hemoglobin  Date Value Ref Range Status  01/26/2022 15.3 13.2 - 17.1 g/dL Final  16/12/9602 54.0 13.0 -  17.7 g/dL Final   HCT  Date Value Ref Range Status  01/26/2022 45.6 38.5 - 50.0 % Final   Hematocrit  Date Value Ref Range Status  12/24/2018 46.8 37.5 - 51.0 % Final   MCHC  Date Value Ref Range Status  01/26/2022 33.6 32.0 - 36.0 g/dL Final   [facility]  Date Value Ref Range Status  01/26/2022 28.5 27.0 - 33.0 pg Final   MCV  Date Value Ref Range Status  01/26/2022 85.1 80.0 - 100.0 fL Final  12/24/2018 84 79 - 97 fL Final   No results found for: "PLTCOUNTKUC", "LABPLAT", "POCPLA" RDW  Date Value Ref Range Status  01/26/2022 13.5 11.0 - 15.0 % Final  12/24/2018 14.6 11.6 - 15.4 % Final

## 2023-02-19 ENCOUNTER — Other Ambulatory Visit: Payer: Self-pay | Admitting: Family Medicine

## 2023-02-19 DIAGNOSIS — I10 Essential (primary) hypertension: Secondary | ICD-10-CM

## 2023-02-19 DIAGNOSIS — E785 Hyperlipidemia, unspecified: Secondary | ICD-10-CM

## 2023-03-20 ENCOUNTER — Ambulatory Visit (INDEPENDENT_AMBULATORY_CARE_PROVIDER_SITE_OTHER): Payer: Medicare Other | Admitting: Family Medicine

## 2023-03-20 VITALS — BP 130/76 | HR 71 | Temp 97.8°F | Ht 65.0 in | Wt 194.8 lb

## 2023-03-20 DIAGNOSIS — Z7984 Long term (current) use of oral hypoglycemic drugs: Secondary | ICD-10-CM | POA: Diagnosis not present

## 2023-03-20 DIAGNOSIS — E785 Hyperlipidemia, unspecified: Secondary | ICD-10-CM | POA: Diagnosis not present

## 2023-03-20 DIAGNOSIS — E118 Type 2 diabetes mellitus with unspecified complications: Secondary | ICD-10-CM | POA: Diagnosis not present

## 2023-03-20 MED ORDER — EPINEPHRINE 0.3 MG/0.3ML IJ SOAJ: 0.3000 mg | Freq: Once | INTRAMUSCULAR | 0 refills | Status: AC | PRN

## 2023-03-20 MED ORDER — DOXYCYCLINE HYCLATE 100 MG PO TABS: 100.0000 mg | ORAL_TABLET | Freq: Two times a day (BID) | ORAL | 0 refills | Status: DC

## 2023-03-20 NOTE — Progress Notes (Signed)
 Subjective:    Patient ID: Jimmy Collins, male    DOB: [birth year], 63 y.o.   MRN: [id number]  HPI  Patient is a 63 year old Caucasian male with a history of diabetes mellitus, dyslipidemia, hypertension, and coronary artery disease.  Denies any chest pain.  Denies any short breath.  He denies any dyspnea on exertion.  His blood pressure today is well-controlled.  He did have 1 episode of syncope this summer.  He passed out while working on his lawnmower.  He states that he was working in a hot garage.  He became very hot and lightheaded.  He went and drank some ice cold water.  That is the last thing he remembers.  His brother was helping him up shortly thereafter.  He denies feeling any cardiac arrhythmia.  He denies feeling any chest pain.  He has not had any other send.  He denies any seizure activity.  He is not checking his blood sugar. 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  Alpha-gal allergy   Diabetes mellitus without complication (HCC)    Dyslipidemia    GERD (gastroesophageal reflux disease)    Hypertension    Kidney stones    [facility] spotted fever    Smoker    Past Surgical History:  Procedure Laterality Date   CORONARY ARTERY BYPASS GRAFT N/A 05/03/2018   Procedure: CORONARY ARTERY BYPASS GRAFTING (CABG), ON PUMP, TIMES TWO, USING LEFT INTERNAL MAMMARY ARTERY AND ENDOSCOPICALLY HARVESTED LEFT GREATER SAPHENOUS VEIN;  Surgeon: Fleeta Hanford Coy, MD;  Location: [facility] OR;  Service: Open Heart Surgery;  Laterality: N/A;   KIDNEY STONE SURGERY     bilateral   KNEE SURGERY     left   LEFT HEART CATH AND CORONARY ANGIOGRAPHY N/A 05/02/2018   Procedure: LEFT HEART CATH AND CORONARY ANGIOGRAPHY;  Surgeon: Court Dorn PARAS, MD;  Location: [facility] INVASIVE CV LAB;  Service: Cardiovascular;  Laterality: N/A;   TEE WITHOUT CARDIOVERSION N/A 05/03/2018   Procedure: TRANSESOPHAGEAL ECHOCARDIOGRAM (TEE);  Surgeon:  Fleeta Hanford, Coy, MD;  Location: [facility] OR;  Service: Open Heart Surgery;  Laterality: N/A;   Current Outpatient Medications on File Prior to Visit  Medication Sig Dispense Refill   aspirin  EC 81 MG tablet Take 81 mg by mouth daily. Swallow whole.     glucose blood (GLUCOSE METER TEST) test strip Use to check fasting blood sugar qam 100 each 12   lansoprazole (PREVACID) 30 MG capsule Take 30 mg by mouth daily.     lisinopril  (ZESTRIL ) 10 MG tablet TAKE 1 TABLET(10 MG) BY MOUTH DAILY 90 tablet 1   metFORMIN  (GLUCOPHAGE ) 1000 MG tablet TAKE 1 TABLET BY MOUTH TWICE DAILY WITH MEALS 180 tablet 0   metoprolol  succinate (TOPROL -XL) 25 MG 24 hr tablet Take 1 tablet (25 mg total) by mouth daily. 90 tablet 3   nitroGLYCERIN  (NITROSTAT ) 0.4 MG SL tablet Place 1 tablet (0.4 mg total) under the tongue every 5 (five) minutes as needed for chest pain. 50 tablet 3   rosuvastatin  (CRESTOR ) 20 MG tablet TAKE 1 TABLET(20 MG) BY MOUTH DAILY 90 tablet 1   No current facility-administered medications on file prior to visit.   Allergies  Allergen Reactions   Other Hives, Itching and Swelling    Mammalian Meat Allergy   Heparin  Other (See Comments)    Pt did not remember rxn   Shellfish Allergy Hives and Rash  Social History   Socioeconomic History   Marital status: Widowed    Spouse name: Not on file   Number of children: 1   Years of education: Not on file   Highest education level: Not on file  Occupational History   Not on file  Tobacco Use   Smoking status: Former    Current packs/day: 1.00    Types: Cigarettes   Smokeless tobacco: Never  Substance and Sexual Activity   Alcohol use: No   Drug use: Never   Sexual activity: Yes  Other Topics Concern   Not on file  Social History Narrative   Disabled; widowed lives with son    Social Drivers of Health   Financial Resource Strain: Low Risk  (10/26/2022)   Overall Financial Resource Strain (CARDIA)    Difficulty of Paying Living Expenses: Not  hard at all  Food Insecurity: No Food Insecurity (10/26/2022)   Hunger Vital Sign    Worried About Running Out of Food in the Last Year: Never true    Ran Out of Food in the Last Year: Never true  Transportation Needs: No Transportation Needs (10/26/2022)   PRAPARE - Administrator, Civil Service (Medical): No    Lack of Transportation (Non-Medical): No  Physical Activity: Inactive (10/26/2022)   Exercise Vital Sign    Days of Exercise per Week: 0 days    Minutes of Exercise per Session: 0 min  Stress: No Stress Concern Present (10/26/2022)   [employer] of Occupational Health - Occupational Stress Questionnaire    Feeling of Stress : Not at all  Social Connections: Socially Isolated (10/26/2022)   Social Connection and Isolation Panel [NHANES]    Frequency of Communication with Friends and Family: More than three times a week    Frequency of Social Gatherings with Friends and Family: Once a week    Attends Religious Services: Never    Database Administrator or Organizations: No    Attends Banker Meetings: Never    Marital Status: Widowed  Intimate Partner Violence: Not At Risk (10/26/2022)   Humiliation, Afraid, Rape, and Kick questionnaire    Fear of Current or Ex-Partner: No    Emotionally Abused: No    Physically Abused: No    Sexually Abused: No      Review of Systems     Objective:   Physical Exam Vitals reviewed.  Constitutional:      Appearance: Normal appearance. He is obese.  Cardiovascular:     Rate and Rhythm: Normal rate and regular rhythm.     Heart sounds: Normal heart sounds. No murmur heard.    No friction rub. No gallop.  Pulmonary:     Effort: Pulmonary effort is normal. No respiratory distress.     Breath sounds: Normal breath sounds. No wheezing, rhonchi or rales.  Abdominal:     General: Abdomen is flat. Bowel sounds are normal. There is no distension.     Palpations: Abdomen is soft. There is no mass.     Tenderness:  There is no abdominal tenderness. There is no guarding or rebound.     Hernia: No hernia is present.  Musculoskeletal:     Right lower leg: No edema.     Left lower leg: No edema.  Neurological:     General: No focal deficit present.     Mental Status: He is alert and oriented to person, place, and time. Mental status is at baseline.  Cranial Nerves: No cranial nerve deficit.     Motor: No weakness.       Controlled type 2 diabetes mellitus with complication, without long-term current use of insulin  (HCC) - Plan: Hemoglobin A1c, Hemoglobin A1c, CBC with Differential/Platelet, COMPLETE METABOLIC PANEL WITH GFR, Lipid panel, Microalbumin/Creatinine Ratio, Urine  Dyslipidemia Patient's blood pressure is excellent.  I will check a lipid panel.  Given his history of coronary artery disease, I would like his LDL cholesterol to be under 55.  I like his hemoglobin A1c to be under 6.5.  Patient politely declines his flu shot.  I will also check a urine protein to creatinine ratio.  I believe his syncopal episode was secondary to vasovagal syncope.  The patient was hot and flushed and lightheaded.  He then passed out.  There was no chest pain or irregular heartbeat.  At the present time we will not institute any other workup unless symptoms return.  Did encourage the patient to quit smoking

## 2023-03-21 LAB — CBC WITH DIFFERENTIAL/PLATELET
Absolute Lymphocytes: 3751 {cells}/uL (ref 850–3900)
Absolute Monocytes: 1003 {cells}/uL — ABNORMAL HIGH (ref 200–950)
Basophils Absolute: 148 {cells}/uL (ref 0–200)
Basophils Relative: 1.3 %
Eosinophils Absolute: 787 {cells}/uL — ABNORMAL HIGH (ref 15–500)
Eosinophils Relative: 6.9 %
HCT: 48.9 % (ref 38.5–50.0)
Hemoglobin: 16.4 g/dL (ref 13.2–17.1)
MCH: 28.9 pg (ref 27.0–33.0)
MCHC: 33.5 g/dL (ref 32.0–36.0)
MCV: 86.2 fL (ref 80.0–100.0)
MPV: 11.1 fL (ref 7.5–12.5)
Monocytes Relative: 8.8 %
Neutro Abs: 5711 {cells}/uL (ref 1500–7800)
Neutrophils Relative %: 50.1 %
Platelets: 256 10*3/uL (ref 140–400)
RBC: 5.67 10*6/uL (ref 4.20–5.80)
RDW: 13.4 % (ref 11.0–15.0)
Total Lymphocyte: 32.9 %
WBC: 11.4 10*3/uL — ABNORMAL HIGH (ref 3.8–10.8)

## 2023-03-21 LAB — COMPLETE METABOLIC PANEL WITH GFR
AG Ratio: 1.6 (calc) (ref 1.0–2.5)
ALT: 17 U/L (ref 9–46)
AST: 17 U/L (ref 10–35)
Albumin: 4.4 g/dL (ref 3.6–5.1)
Alkaline phosphatase (APISO): 59 U/L (ref 35–144)
BUN: 9 mg/dL (ref 7–25)
CO2: 27 mmol/L (ref 20–32)
Calcium: 10 mg/dL (ref 8.6–10.3)
Chloride: 104 mmol/L (ref 98–110)
Creat: 0.9 mg/dL (ref 0.70–1.35)
Globulin: 2.8 g/dL (ref 1.9–3.7)
Glucose, Bld: 83 mg/dL (ref 65–99)
Potassium: 4.2 mmol/L (ref 3.5–5.3)
Sodium: 141 mmol/L (ref 135–146)
Total Bilirubin: 0.4 mg/dL (ref 0.2–1.2)
Total Protein: 7.2 g/dL (ref 6.1–8.1)
eGFR: 97 mL/min/{1.73_m2} (ref >=60)

## 2023-03-21 LAB — HEMOGLOBIN A1C
Hgb A1c MFr Bld: 5.6 %{Hb} (ref <5.7)
Mean Plasma Glucose: 114 mg/dL
eAG (mmol/L): 6.3 mmol/L

## 2023-03-21 LAB — LIPID PANEL
Cholesterol: 118 mg/dL (ref <200)
HDL: 44 mg/dL (ref >=40)
LDL Cholesterol (Calc): 53 mg/dL
Non-HDL Cholesterol (Calc): 74 mg/dL (ref <130)
Total CHOL/HDL Ratio: 2.7 (calc) (ref <5.0)
Triglycerides: 129 mg/dL (ref <150)

## 2023-03-21 LAB — MICROALBUMIN / CREATININE URINE RATIO
Creatinine, Urine: 89 mg/dL (ref 20–320)
Microalb Creat Ratio: 19 mg/g{creat} (ref <30)
Microalb, Ur: 1.7 mg/dL

## 2023-04-07 ENCOUNTER — Other Ambulatory Visit: Payer: Self-pay | Admitting: Family Medicine

## 2023-04-07 DIAGNOSIS — E118 Type 2 diabetes mellitus with unspecified complications: Secondary | ICD-10-CM

## 2023-04-13 ENCOUNTER — Encounter: Payer: Self-pay | Admitting: Cardiovascular Disease

## 2023-04-13 ENCOUNTER — Ambulatory Visit: Payer: Medicare Other | Attending: Cardiovascular Disease | Admitting: Cardiovascular Disease

## 2023-04-13 VITALS — BP 108/68 | HR 76 | Ht 65.0 in | Wt 194.2 lb

## 2023-04-13 DIAGNOSIS — I10 Essential (primary) hypertension: Secondary | ICD-10-CM | POA: Diagnosis not present

## 2023-04-13 DIAGNOSIS — E785 Hyperlipidemia, unspecified: Secondary | ICD-10-CM

## 2023-04-13 DIAGNOSIS — Z951 Presence of aortocoronary bypass graft: Secondary | ICD-10-CM

## 2023-04-13 NOTE — Assessment & Plan Note (Signed)
History of essential hypertension her blood pressure measured today at 108/68.  He is on lisinopril and metoprolol.

## 2023-04-13 NOTE — Assessment & Plan Note (Signed)
History of CAD status post cardiac cath by myself 05/02/2018 revealing high-grade distal left main disease and segmental proximal LAD disease with right to left collaterals and normal LV function.  The following day he underwent CABG x 2 by [name] with a LIMA to his LAD and a vein to an obtuse marginal branch and was discharged home 1 week later.  He is completely asymptomatic.

## 2023-04-13 NOTE — Assessment & Plan Note (Signed)
History of dyslipidemia on statin therapy with lipid profile performed 03/20/2023 revealing total cholesterol 118, LDL 53 and HDL 54.

## 2023-04-13 NOTE — Progress Notes (Signed)
04/13/2023 Jimmy Collins   November 24, 1960  [id number]  Primary Physician Jimmy Collins Jimmy Heidelberg, MD Primary Cardiologist: Jimmy Gess MD Jimmy Collins, [place]  HPI:  Jimmy Collins is a 63 y.o.    moderately overweight married Caucasian male father of 1 child referred by Dr. Tanya Collins for cardiovascular evaluation because of new onset effort angina.  He no longer works and is out on disability..  I last  saw him in the office 04/04/2022. His risk factors include 80-pack-year history tobacco abuse having cut back from 2 packs a day to 1/2 pack a day 2 weeks ago at the request of his PCP.  His risk factors otherwise include treated hypertension, hyperlipidemia and family history with a father who had a myocardial infarction and a brother who is had stents.  His brother is my patient and I placed the stents.  He also has a hiatal hernia with GERD.  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  He has had chest pain for 4 weeks which is typically exertional with pain down both arms and shortness of breath.   I performed cardiac catheterization on him 05/02/2018 revealing high-grade distal left main disease and segmental proximal LAD disease with right to left collaterals and normal LV function.  The following day he underwent CABG x2 by Dr. Maren Collins with a LIMA to his LAD and a vein to an obtuse marginal branch.  Was discharged home 1 week later.  He feels clinically improved.   Unfortunately, his wife of 21 years died on Apr 08, 2021 of liver failure.  He had gone back to smoking because of the stress and grief.  He stopped working 8 months ago.  He lives with his 63 year old son.   Since I saw him a year ago he continues to do well.   He denies chest pain or shortness of breath.  He is slowly recovering from the loss of his wife.   Current Meds  Medication Sig   aspirin EC 81 MG tablet Take 81 mg by mouth daily. Swallow whole.   EPINEPHrine (EPIPEN 2-PAK)  0.3 mg/0.3 mL IJ SOAJ injection Inject 0.3 mg into the muscle once as needed (for severe allergic reaction).   glucose blood (GLUCOSE METER TEST) test strip Use to check fasting blood sugar qam   lansoprazole (PREVACID) 30 MG capsule Take 30 mg by mouth daily.   lisinopril (ZESTRIL) 10 MG tablet TAKE 1 TABLET(10 MG) BY MOUTH DAILY   metFORMIN (GLUCOPHAGE) 1000 MG tablet TAKE 1 TABLET BY MOUTH TWICE DAILY WITH MEALS   metoprolol succinate (TOPROL-XL) 25 MG 24 hr tablet Take 1 tablet (25 mg total) by mouth daily.   nitroGLYCERIN (NITROSTAT) 0.4 MG SL tablet Place 1 tablet (0.4 mg total) under the tongue every 5 (five) minutes as needed for chest pain.   rosuvastatin (CRESTOR) 20 MG tablet TAKE 1 TABLET(20 MG) BY MOUTH DAILY     Allergies  Allergen Reactions   Other Hives, Itching and Swelling    Mammalian Meat Allergy   Heparin Other (See Comments)    Pt did not remember rxn   Shellfish Allergy Hives and Rash    Social History   Socioeconomic History   Marital status: Widowed    Spouse name: Not on file   Number of children: 1   Years of education: Not on file   Highest education level: Not on file  Occupational History   Not on file  Tobacco Use   Smoking status: Former    Current packs/day: 1.00    Types: Cigarettes   Smokeless tobacco: Never  Substance and Sexual Activity   Alcohol use: No   Drug use: Never   Sexual activity: Yes  Other Topics Concern   Not on file  Social History Narrative   Disabled; widowed lives with son    Social Drivers of Health   Financial Resource Strain: Low Risk  (10/26/2022)   Overall Financial Resource Strain (CARDIA)    Difficulty of Paying Living Expenses: Not hard at all  Food Insecurity: No Food Insecurity (10/26/2022)   Hunger Vital Sign    Worried About Running Out of Food in the Last Year: Never true    Ran Out of Food in the Last Year: Never true  Transportation Needs: No Transportation Needs (10/26/2022)   PRAPARE -  Administrator, Civil Service (Medical): No    Lack of Transportation (Non-Medical): No  Physical Activity: Inactive (10/26/2022)   Exercise Vital Sign    Days of Exercise per Week: 0 days    Minutes of Exercise per Session: 0 min  Stress: No Stress Concern Present (10/26/2022)   [employer] of Occupational Health - Occupational Stress Questionnaire    Feeling of Stress : Not at all  Social Connections: Socially Isolated (10/26/2022)   Social Connection and Isolation Panel [NHANES]    Frequency of Communication with Friends and Family: More than three times a week    Frequency of Social Gatherings with Friends and Family: Once a week    Attends Religious Services: Never    Database administrator or Organizations: No    Attends Banker Meetings: Never    Marital Status: Widowed  Intimate Partner Violence: Not At Risk (10/26/2022)   Humiliation, Afraid, Rape, and Kick questionnaire    Fear of Current or Ex-Partner: No    Emotionally Abused: No    Physically Abused: No    Sexually Abused: No     Review of Systems: General: negative for chills, fever, night sweats or weight changes.  Cardiovascular: negative for chest pain, dyspnea on exertion, edema, orthopnea, palpitations, paroxysmal nocturnal dyspnea or shortness of breath Dermatological: negative for rash Respiratory: negative for cough or wheezing Urologic: negative for hematuria Abdominal: negative for nausea, vomiting, diarrhea, bright red blood per rectum, melena, or hematemesis Neurologic: negative for visual changes, syncope, or dizziness All other systems reviewed and are otherwise negative except as noted above.    Blood pressure 108/68, pulse 76, height 5\' 5"  (1.651 m), weight 194 lb 3.2 oz (88.1 kg), SpO2 97%.  General appearance: alert and no distress Neck: no adenopathy, no carotid bruit, no JVD, supple, symmetrical, trachea midline, and thyroid not enlarged, symmetric, no  tenderness/mass/nodules Lungs: clear to auscultation bilaterally Heart: regular rate and rhythm, S1, S2 normal, no murmur, click, rub or gallop Extremities: extremities normal, atraumatic, no cyanosis or edema Pulses: 2+ and symmetric Skin: Skin color, texture, turgor normal. No rashes or lesions Neurologic: Grossly normal  EKG EKG Interpretation Date/Time:  Friday April 13 2023 12:15:57 EST Ventricular Rate:  76 PR Interval:  164 QRS Duration:  108 QT Interval:  402 QTC Calculation: 452 R Axis:   -32  Text Interpretation: Normal sinus rhythm Left axis deviation Incomplete right bundle branch block T wave abnormality, consider anterior ischemia When compared with ECG of 04-May-2018 00:33, T wave inversion now evident in Anterior leads Confirmed by Nanetta Batty [id number]) on 04/13/2023 12:25:04  PM    ASSESSMENT AND PLAN:   Dyslipidemia History of dyslipidemia on statin therapy with lipid profile performed 03/20/2023 revealing total cholesterol 118, LDL 53 and HDL 54.  Essential hypertension History of essential hypertension her blood pressure measured today at 108/68.  He is on lisinopril and metoprolol.  S/P CABG x 2 History of CAD status post cardiac cath by myself 05/02/2018 revealing high-grade distal left main disease and segmental proximal LAD disease with right to left collaterals and normal LV function.  The following day he underwent CABG x 2 by Dr. Maren Collins with a LIMA to his LAD and a vein to an obtuse marginal branch and was discharged home 1 week later.  He is completely asymptomatic.     Jimmy Gess MD FACP,FACC,FAHA, [facility] 04/13/2023 12:31 PM

## 2023-04-13 NOTE — Patient Instructions (Signed)
    Follow-Up: At [facility], you and your health needs are our priority.  As part of our continuing mission to provide you with exceptional heart care, we have created designated Provider Care Teams.  These Care Teams include your primary Cardiologist (physician) and Advanced Practice Providers (APPs -  Physician Assistants and Nurse Practitioners) who all work together to provide you with the care you need, when you need it.  We recommend signing up for the patient portal called "MyChart".  Sign up information is provided on this After Visit Summary.  MyChart is used to connect with patients for Virtual Visits (Telemedicine).  Patients are able to view lab/test results, encounter notes, upcoming appointments, etc.  Non-urgent messages can be sent to your provider as well.   To learn more about what you can do with MyChart, go to [email or web address].    Your next appointment:   12 month(s)  Provider:   Jonathan Berry, MD      

## 2023-06-04 ENCOUNTER — Other Ambulatory Visit: Payer: Self-pay

## 2023-06-04 ENCOUNTER — Other Ambulatory Visit: Payer: Self-pay | Admitting: Family Medicine

## 2023-06-04 DIAGNOSIS — I10 Essential (primary) hypertension: Secondary | ICD-10-CM

## 2023-06-04 DIAGNOSIS — E118 Type 2 diabetes mellitus with unspecified complications: Secondary | ICD-10-CM

## 2023-06-04 MED ORDER — METFORMIN HCL 1000 MG PO TABS
1000.0000 mg | ORAL_TABLET | Freq: Two times a day (BID) | ORAL | 2 refills | Status: AC
Start: 2023-06-04 — End: ?

## 2023-06-04 MED ORDER — METOPROLOL SUCCINATE ER 25 MG PO TB24: 25.0000 mg | ORAL_TABLET | Freq: Every day | ORAL | 3 refills | Status: DC

## 2023-06-05 NOTE — Telephone Encounter (Signed)
 Requested Prescriptions  Refused Prescriptions Disp Refills   metFORMIN (GLUCOPHAGE) 1000 MG tablet [Pharmacy Med Name: METFORMIN 1000MG  TABLETS] 180 tablet 0    Sig: TAKE 1 TABLET BY MOUTH TWICE DAILY WITH MEALS     Endocrinology:  Diabetes - Biguanides Failed - 06/05/2023  3:59 PM      Failed - B12 Level in normal range and within 720 days    No results found for: "VITAMINB12"       Failed - Valid encounter within last 6 months    Recent Outpatient Visits           2 years ago S/P CABG x 2   [facility] Medicine Donita Brooks, MD   3 years ago Uncontrolled diabetes mellitus with circulatory complication, without long-term current use of insulin (HCC)   [facility] Medicine Tanya Nones, Priscille Heidelberg, MD   3 years ago Weight loss   [facility] Medicine Donita Brooks, MD   4 years ago Low back pain at multiple sites   [facility] Medicine Pickard, Priscille Heidelberg, MD   5 years ago Hospital discharge follow-up   [facility] Medicine Donita Brooks, MD              Passed - Cr in normal range and within 360 days    Creat  Date Value Ref Range Status  03/20/2023 0.90 0.70 - 1.35 mg/dL Final   Creatinine, Urine  Date Value Ref Range Status  03/20/2023 89 20 - 320 mg/dL Final         Passed - HBA1C is between 0 and 7.9 and within 180 days    Hgb A1c MFr Bld  Date Value Ref Range Status  03/20/2023 5.6 <5.7 % of total Hgb Final    Comment:    For the purpose of screening for the presence of diabetes: . <5.7%       Consistent with the absence of diabetes 5.7-6.4%    Consistent with increased risk for diabetes             (prediabetes) > or =6.5%  Consistent with diabetes . This assay result is consistent with a decreased risk of diabetes. . Currently, no consensus exists regarding use of hemoglobin A1c for diagnosis of diabetes in children. . According to American Diabetes Association (ADA) guidelines, hemoglobin A1c <7.0%  represents optimal control in non-pregnant diabetic patients. Different metrics may apply to specific patient populations.  Standards of Medical Care in Diabetes(ADA). .          Passed - eGFR in normal range and within 360 days    GFR, Est African American  Date Value Ref Range Status  09/03/2020 100 > OR = 60 mL/min/1.59m2 Final   GFR, Est Non African American  Date Value Ref Range Status  09/03/2020 87 > OR = 60 mL/min/1.14m2 Final   eGFR  Date Value Ref Range Status  03/20/2023 97 > OR = 60 mL/min/1.49m2 Final         Passed - CBC within normal limits and completed in the last 12 months    WBC  Date Value Ref Range Status  03/20/2023 11.4 (H) 3.8 - 10.8 Thousand/uL Final   RBC  Date Value Ref Range Status  03/20/2023 5.67 4.20 - 5.80 Million/uL Final   Hemoglobin  Date Value Ref Range Status  03/20/2023 16.4 13.2 - 17.1 g/dL Final  16/12/9602 54.0 13.0 - 17.7 g/dL Final   HCT  Date Value  Ref Range Status  03/20/2023 48.9 38.5 - 50.0 % Final   Hematocrit  Date Value Ref Range Status  12/24/2018 46.8 37.5 - 51.0 % Final   MCHC  Date Value Ref Range Status  03/20/2023 33.5 32.0 - 36.0 g/dL Final    Comment:    For adults, a slight decrease in the calculated MCHC value (in the range of 30 to 32 g/dL) is most likely not clinically significant; however, it should be interpreted with caution in correlation with other red cell parameters and the patient's clinical condition.    [facility]  Date Value Ref Range Status  03/20/2023 28.9 27.0 - 33.0 pg Final   MCV  Date Value Ref Range Status  03/20/2023 86.2 80.0 - 100.0 fL Final  12/24/2018 84 79 - 97 fL Final   No results found for: "PLTCOUNTKUC", "LABPLAT", "POCPLA" RDW  Date Value Ref Range Status  03/20/2023 13.4 11.0 - 15.0 % Final  12/24/2018 14.6 11.6 - 15.4 % Final          metoprolol succinate (TOPROL-XL) 25 MG 24 hr tablet [Pharmacy Med Name: METOPROLOL ER SUCCINATE 25MG  TABS] 90 tablet 3     Sig: TAKE 1 TABLET BY MOUTH EVERY DAY     Cardiovascular:  Beta Blockers Failed - 06/05/2023  3:59 PM      Failed - Valid encounter within last 6 months    Recent Outpatient Visits           2 years ago S/P CABG x 2   [facility] Medicine Donita Brooks, MD   3 years ago Uncontrolled diabetes mellitus with circulatory complication, without long-term current use of insulin (HCC)   [facility] Medicine Pickard, Priscille Heidelberg, MD   3 years ago Weight loss   [facility] Medicine Donita Brooks, MD   4 years ago Low back pain at multiple sites   [facility] Medicine Pickard, Priscille Heidelberg, MD   5 years ago Hospital discharge follow-up   [facility] Medicine Donita Brooks, MD              Passed - Last BP in normal range    BP Readings from Last 1 Encounters:  04/13/23 108/68         Passed - Last Heart Rate in normal range    Pulse Readings from Last 1 Encounters:  04/13/23 76

## 2023-08-28 ENCOUNTER — Other Ambulatory Visit: Payer: Self-pay | Admitting: Family Medicine

## 2023-08-28 DIAGNOSIS — E785 Hyperlipidemia, unspecified: Secondary | ICD-10-CM

## 2023-08-28 DIAGNOSIS — I10 Essential (primary) hypertension: Secondary | ICD-10-CM

## 2023-11-01 ENCOUNTER — Ambulatory Visit: Payer: Medicare Other

## 2023-11-28 ENCOUNTER — Emergency Department (HOSPITAL_COMMUNITY)

## 2023-11-28 ENCOUNTER — Emergency Department (HOSPITAL_COMMUNITY): Admission: EM | Admit: 2023-11-28 | Discharge: 2023-11-28 | Disposition: A | Discharge status: Home / Self Care

## 2023-11-28 ENCOUNTER — Other Ambulatory Visit: Payer: Self-pay

## 2023-11-28 DIAGNOSIS — S060X9A Concussion with loss of consciousness of unspecified duration, initial encounter: Secondary | ICD-10-CM | POA: Diagnosis not present

## 2023-11-28 DIAGNOSIS — H538 Other visual disturbances: Secondary | ICD-10-CM | POA: Diagnosis not present

## 2023-11-28 DIAGNOSIS — E119 Type 2 diabetes mellitus without complications: Secondary | ICD-10-CM | POA: Diagnosis not present

## 2023-11-28 DIAGNOSIS — Z7982 Long term (current) use of aspirin: Secondary | ICD-10-CM | POA: Insufficient documentation

## 2023-11-28 DIAGNOSIS — R0781 Pleurodynia: Secondary | ICD-10-CM | POA: Diagnosis not present

## 2023-11-28 DIAGNOSIS — S20212A Contusion of left front wall of thorax, initial encounter: Secondary | ICD-10-CM | POA: Insufficient documentation

## 2023-11-28 DIAGNOSIS — Z7984 Long term (current) use of oral hypoglycemic drugs: Secondary | ICD-10-CM | POA: Diagnosis not present

## 2023-11-28 DIAGNOSIS — S060X1A Concussion with loss of consciousness of 30 minutes or less, initial encounter: Secondary | ICD-10-CM | POA: Diagnosis not present

## 2023-11-28 DIAGNOSIS — I10 Essential (primary) hypertension: Secondary | ICD-10-CM | POA: Diagnosis not present

## 2023-11-28 DIAGNOSIS — S199XXA Unspecified injury of neck, initial encounter: Secondary | ICD-10-CM | POA: Diagnosis not present

## 2023-11-28 DIAGNOSIS — Y92014 Private driveway to single-family (private) house as the place of occurrence of the external cause: Secondary | ICD-10-CM | POA: Diagnosis not present

## 2023-11-28 DIAGNOSIS — Z951 Presence of aortocoronary bypass graft: Secondary | ICD-10-CM | POA: Diagnosis not present

## 2023-11-28 DIAGNOSIS — R410 Disorientation, unspecified: Secondary | ICD-10-CM | POA: Diagnosis present

## 2023-11-28 DIAGNOSIS — I6523 Occlusion and stenosis of bilateral carotid arteries: Secondary | ICD-10-CM | POA: Diagnosis not present

## 2023-11-28 DIAGNOSIS — S20219A Contusion of unspecified front wall of thorax, initial encounter: Secondary | ICD-10-CM

## 2023-11-28 NOTE — ED Notes (Signed)
 Patient dc by RN. Patient ambulated around EMS triage without dizziness or unsteadiness. In wheelchair to lobby to be picked up by son.
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
## 2023-11-28 NOTE — ED Notes (Signed)
 Patient transported to X-ray
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
## 2023-12-24 DIAGNOSIS — E119 Type 2 diabetes mellitus without complications: Secondary | ICD-10-CM | POA: Diagnosis not present

## 2023-12-24 DIAGNOSIS — H25812 Combined forms of age-related cataract, left eye: Secondary | ICD-10-CM | POA: Diagnosis not present

## 2024-01-08 DIAGNOSIS — H2512 Age-related nuclear cataract, left eye: Secondary | ICD-10-CM | POA: Diagnosis not present

## 2024-01-14 ENCOUNTER — Encounter: Payer: Self-pay | Admitting: *Deleted

## 2024-01-14 NOTE — Progress Notes (Signed)
 Jimmy Collins                                          MRN: [id number]   01/14/2024   The VBCI Quality Team Specialist reviewed this patient medical record for the purposes of chart review for care gap closure. The following were reviewed: chart review for care gap closure-colorectal cancer screening and diabetic eye exam.    VBCI Quality Team

## 2024-03-10 ENCOUNTER — Other Ambulatory Visit: Payer: Self-pay | Admitting: Family Medicine

## 2024-03-10 DIAGNOSIS — I10 Essential (primary) hypertension: Secondary | ICD-10-CM

## 2024-03-10 DIAGNOSIS — E785 Hyperlipidemia, unspecified: Secondary | ICD-10-CM

## 2024-03-19 ENCOUNTER — Other Ambulatory Visit: Payer: Self-pay

## 2024-03-19 ENCOUNTER — Other Ambulatory Visit: Payer: Self-pay | Admitting: Family Medicine

## 2024-03-19 ENCOUNTER — Telehealth: Payer: Self-pay

## 2024-03-19 DIAGNOSIS — I10 Essential (primary) hypertension: Secondary | ICD-10-CM

## 2024-03-19 DIAGNOSIS — E785 Hyperlipidemia, unspecified: Secondary | ICD-10-CM

## 2024-03-19 MED ORDER — LISINOPRIL 10 MG PO TABS: ORAL_TABLET | ORAL | 1 refills | Status: AC

## 2024-03-19 MED ORDER — ROSUVASTATIN CALCIUM 20 MG PO TABS: ORAL_TABLET | ORAL | 1 refills | Status: AC

## 2024-03-19 MED ORDER — METOPROLOL SUCCINATE ER 25 MG PO TB24: 25.0000 mg | ORAL_TABLET | Freq: Every day | ORAL | 1 refills | Status: AC

## 2024-03-19 NOTE — Telephone Encounter (Signed)
 Pt came into office to inquire about refills of these meds:   rosuvastatin  (CRESTOR ) 20 MG tablet [[id number]] lisinopril  (ZESTRIL ) 10 MG tablet [[id number]] metoprolol  succinate (TOPROL -XL) 25 MG 24 hr tablet [[id number]]  PHARMACY:[id number] [address], [place] - [address] AT [facility] [address] & [facility] [address] [place] [address] Phone: [phone]  Fax: [phone]   Please call [phone]

## 2024-03-28 ENCOUNTER — Other Ambulatory Visit

## 2024-03-28 DIAGNOSIS — R5383 Other fatigue: Secondary | ICD-10-CM

## 2024-03-28 DIAGNOSIS — I10 Essential (primary) hypertension: Secondary | ICD-10-CM

## 2024-03-28 DIAGNOSIS — E785 Hyperlipidemia, unspecified: Secondary | ICD-10-CM

## 2024-03-28 DIAGNOSIS — E118 Type 2 diabetes mellitus with unspecified complications: Secondary | ICD-10-CM

## 2024-03-31 ENCOUNTER — Ambulatory Visit: Payer: Self-pay | Admitting: Family Medicine

## 2024-04-03 ENCOUNTER — Ambulatory Visit: Admitting: Family Medicine

## 2024-04-03 ENCOUNTER — Encounter: Payer: Self-pay | Admitting: Family Medicine

## 2024-04-03 VITALS — BP 136/86 | HR 75 | Temp 98.3°F | Ht 65.0 in | Wt 198.8 lb

## 2024-04-03 DIAGNOSIS — Z0001 Encounter for general adult medical examination with abnormal findings: Secondary | ICD-10-CM | POA: Diagnosis not present

## 2024-04-03 DIAGNOSIS — I152 Hypertension secondary to endocrine disorders: Secondary | ICD-10-CM

## 2024-04-03 DIAGNOSIS — I10 Essential (primary) hypertension: Secondary | ICD-10-CM

## 2024-04-03 DIAGNOSIS — E785 Hyperlipidemia, unspecified: Secondary | ICD-10-CM | POA: Diagnosis not present

## 2024-04-03 DIAGNOSIS — E1159 Type 2 diabetes mellitus with other circulatory complications: Secondary | ICD-10-CM | POA: Diagnosis not present

## 2024-04-03 DIAGNOSIS — Z7984 Long term (current) use of oral hypoglycemic drugs: Secondary | ICD-10-CM | POA: Diagnosis not present

## 2024-04-03 DIAGNOSIS — Z23 Encounter for immunization: Secondary | ICD-10-CM

## 2024-04-03 DIAGNOSIS — I2510 Atherosclerotic heart disease of native coronary artery without angina pectoris: Secondary | ICD-10-CM | POA: Diagnosis not present

## 2024-04-03 DIAGNOSIS — Z0000 Encounter for general adult medical examination without abnormal findings: Secondary | ICD-10-CM

## 2024-04-03 NOTE — Addendum Note (Signed)
 Addended by: ANGELENA RONAL BRADLEY K on: 04/03/2024 04:01 PM   Modules accepted: Orders

## 2024-04-03 NOTE — Progress Notes (Signed)
 "  Subjective:    Patient ID: Jimmy Collins, male    DOB: 12-Jan-1961, 64 y.o.   MRN: [id number]  HPI  Patient is a 64 year old Caucasian male with a history of diabetes mellitus, dyslipidemia, hypertension, and coronary artery disease.  Patient is due for flu shot, shingles shot, pneumonia shot, and tetanus shot.  He agrees to receive the shingles shot today but declines the others.  He has never had colon cancer screening.  He declines a colonoscopy.  He also declines Cologuard.  He is due for prostate cancer so we will check a PSA to screen for this.  Diabetic foot exam was performed today and is normal.  Blood pressure today is acceptable at 136/86.  His most recent lab work is listed below and for the most part is outstanding Lab on 03/28/2024  Component Date Value Ref Range Status   WBC 03/28/2024 11.3 (H)  3.8 - 10.8 Thousand/uL Final   RBC 03/28/2024 5.29  4.20 - 5.80 Million/uL Final   Hemoglobin 03/28/2024 15.4  13.2 - 17.1 g/dL Final   HCT 98/83/7973 45.7  39.4 - 51.1 % Final   MCV 03/28/2024 86.4  81.4 - 101.7 fL Final   MCH 03/28/2024 29.1  27.0 - 33.0 pg Final   MCHC 03/28/2024 33.7  31.6 - 35.4 g/dL Final   RDW 98/83/7973 13.7  11.0 - 15.0 % Final   Platelets 03/28/2024 243  140 - 400 Thousand/uL Final   MPV 03/28/2024 10.7  7.5 - 12.5 fL Final   Neutro Abs 03/28/2024 6,272  1,500 - 7,800 cells/uL Final   Absolute Lymphocytes 03/28/2024 3,311  850 - 3,900 cells/uL Final   Absolute Monocytes 03/28/2024 949  200 - 950 cells/uL Final   Eosinophils Absolute 03/28/2024 622 (H)  15 - 500 cells/uL Final   Basophils Absolute 03/28/2024 147  0 - 200 cells/uL Final   Neutrophils Relative % 03/28/2024 55.5  % Final   Total Lymphocyte 03/28/2024 29.3  % Final   Monocytes Relative 03/28/2024 8.4  % Final   Eosinophils Relative 03/28/2024 5.5  % Final   Basophils Relative 03/28/2024 1.3  % Final   Glucose, Bld 03/28/2024 108 (H)  65 - 99 mg/dL Final   Comment: .            Fasting  reference interval . For someone without known diabetes, a glucose value between 100 and 125 mg/dL is consistent with prediabetes and should be confirmed with a follow-up test. .    BUN 03/28/2024 9  7 - 25 mg/dL Final   Creat 98/83/7973 0.80  0.70 - 1.35 mg/dL Final   BUN/Creatinine Ratio 03/28/2024 SEE NOTE:  6 - 22 (calc) Final   Comment:    Not Reported: BUN and Creatinine are within    reference range. .    Sodium 03/28/2024 141  135 - 146 mmol/L Final   Potassium 03/28/2024 4.3  3.5 - 5.3 mmol/L Final   Chloride 03/28/2024 107  98 - 110 mmol/L Final   CO2 03/28/2024 26  20 - 32 mmol/L Final   Calcium  03/28/2024 9.2  8.6 - 10.3 mg/dL Final   Total Protein 98/83/7973 6.6  6.1 - 8.1 g/dL Final   Albumin  03/28/2024 4.3  3.6 - 5.1 g/dL Final   Globulin 98/83/7973 2.3  1.9 - 3.7 g/dL (calc) Final   AG Ratio 03/28/2024 1.9  1.0 - 2.5 (calc) Final   Total Bilirubin 03/28/2024 0.4  0.2 - 1.2 mg/dL Final   Alkaline  phosphatase (APISO) 03/28/2024 55  35 - 144 U/L Final   AST 03/28/2024 20  10 - 35 U/L Final   ALT 03/28/2024 23  9 - 46 U/L Final   Cholesterol 03/28/2024 93  <200 mg/dL Final   HDL 98/83/7973 36 (L)  > OR = 40 mg/dL Final   Triglycerides 98/83/7973 69  <150 mg/dL Final   LDL Cholesterol (Calc) 03/28/2024 42  mg/dL (calc) Final   Comment: Reference range: <100 . Desirable range <100 mg/dL for primary prevention;   <70 mg/dL for patients with CHD or diabetic patients  with > or = 2 CHD risk factors. SABRA LDL-C is now calculated using the Martin-Hopkins  calculation, which is a validated novel method providing  better accuracy than the Friedewald equation in the  estimation of LDL-C.  Gladis APPLETHWAITE et al. SANDREA. 7986;689(80): 2061-2068  (http://education.[employer].com/faq/FAQ164)    Total CHOL/HDL Ratio 03/28/2024 2.6  <4.9 (calc) Final   Non-HDL Cholesterol (Calc) 03/28/2024 57  <130 mg/dL (calc) Final   Comment: For patients with diabetes plus 1 major ASCVD risk   factor, treating to a non-HDL-C goal of <100 mg/dL  (LDL-C of <29 mg/dL) is considered a therapeutic  option.    Hgb A1c MFr Bld 03/28/2024 5.7 (H)  <5.7 % Final   Comment: For someone without known diabetes, a hemoglobin  A1c value between 5.7% and 6.4% is consistent with prediabetes and should be confirmed with a  follow-up test. . For someone with known diabetes, a value <7% indicates that their diabetes is well controlled. A1c targets should be individualized based on duration of diabetes, age, comorbid conditions, and other considerations. . This assay result is consistent with an increased risk of diabetes. . Currently, no consensus exists regarding use of hemoglobin A1c for diagnosis of diabetes for children. .    Mean Plasma Glucose 03/28/2024 117  mg/dL Final   eAG (mmol/L) 98/83/7973 6.5  mmol/L Final   Creatinine, Urine 03/28/2024 167  20 - 320 mg/dL Final   Microalb, Ur 98/83/7973 2.6  mg/dL Final   Comment: Reference Range Not established    Microalb Creat Ratio 03/28/2024 16  <30 mg/g creat Final   Comment: . The ADA defines abnormalities in albumin  excretion as follows: SABRA Albuminuria Category        Result (mg/g creatinine) . Normal to Mildly increased   <30 Moderately increased         30-299  Severely increased           > OR = 300 . The ADA recommends that at least two of three specimens collected within a 3-6 month period be abnormal before considering a patient to be within a diagnostic category.    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  Alpha-gal allergy   Diabetes mellitus without complication (HCC)    Dyslipidemia    GERD (gastroesophageal reflux disease)    Hypertension    Kidney stones    [facility] spotted fever    Smoker    Past Surgical History:  Procedure Laterality Date   CORONARY  ARTERY BYPASS GRAFT N/A 05/03/2018   Procedure: CORONARY ARTERY BYPASS GRAFTING (CABG), ON PUMP, TIMES TWO, USING LEFT INTERNAL MAMMARY ARTERY AND ENDOSCOPICALLY HARVESTED LEFT GREATER SAPHENOUS VEIN;  Surgeon: Fleeta Hanford Coy, MD;  Location: [facility] OR;  Service: Open  Heart Surgery;  Laterality: N/A;   KIDNEY STONE SURGERY     bilateral   KNEE SURGERY     left   LEFT HEART CATH AND CORONARY ANGIOGRAPHY N/A 05/02/2018   Procedure: LEFT HEART CATH AND CORONARY ANGIOGRAPHY;  Surgeon: Court Dorn PARAS, MD;  Location: [facility] INVASIVE CV LAB;  Service: Cardiovascular;  Laterality: N/A;   TEE WITHOUT CARDIOVERSION N/A 05/03/2018   Procedure: TRANSESOPHAGEAL ECHOCARDIOGRAM (TEE);  Surgeon: Fleeta Ochoa, Maude, MD;  Location: [facility] OR;  Service: Open Heart Surgery;  Laterality: N/A;   Current Outpatient Medications on File Prior to Visit  Medication Sig Dispense Refill   aspirin  EC 81 MG tablet Take 81 mg by mouth daily. Swallow whole.     EPINEPHrine  (EPIPEN  2-PAK) 0.3 mg/0.3 mL IJ SOAJ injection Inject 0.3 mg into the muscle once as needed (for severe allergic reaction). 2 each 0   glucose blood (GLUCOSE METER TEST) test strip Use to check fasting blood sugar qam 100 each 12   lansoprazole (PREVACID) 30 MG capsule Take 30 mg by mouth daily.     lisinopril  (ZESTRIL ) 10 MG tablet TAKE 1 TABLET(10 MG) BY MOUTH DAILY 30 tablet 1   metFORMIN  (GLUCOPHAGE ) 1000 MG tablet Take 1 tablet (1,000 mg total) by mouth 2 (two) times daily with a meal. 180 tablet 2   metoprolol  succinate (TOPROL -XL) 25 MG 24 hr tablet Take 1 tablet (25 mg total) by mouth daily. 30 tablet 1   nitroGLYCERIN  (NITROSTAT ) 0.4 MG SL tablet Place 1 tablet (0.4 mg total) under the tongue every 5 (five) minutes as needed for chest pain. 50 tablet 3   rosuvastatin  (CRESTOR ) 20 MG tablet TAKE 1 TABLET(20 MG) BY MOUTH DAILY 30 tablet 1   No current facility-administered medications on file prior to visit.   Allergies  Allergen Reactions   Other Hives,  Itching and Swelling    Mammalian Meat Allergy   Heparin  Other (See Comments)    Pt did not remember rxn   Shellfish Allergy Hives and Rash   Social History   Socioeconomic History   Marital status: Widowed    Spouse name: Not on file   Number of children: 1   Years of education: Not on file   Highest education level: Not on file  Occupational History   Not on file  Tobacco Use   Smoking status: Former    Current packs/day: 1.00    Types: Cigarettes   Smokeless tobacco: Never  Substance and Sexual Activity   Alcohol use: No   Drug use: Never   Sexual activity: Yes  Other Topics Concern   Not on file  Social History Narrative   Disabled; widowed lives with son    Social Drivers of Health   Tobacco Use: Medium Risk (04/03/2024)   Patient History    Smoking Tobacco Use: Former    Smokeless Tobacco Use: Never    Passive Exposure: Not on file  Financial Resource Strain: Low Risk (10/26/2022)   Overall Financial Resource Strain (CARDIA)    Difficulty of Paying Living Expenses: Not hard at all  Food Insecurity: No Food Insecurity (10/26/2022)   Hunger Vital Sign    Worried About Running Out of Food in the Last Year: Never true    Ran Out of Food in the Last Year: Never true  Transportation Needs: No Transportation Needs (10/26/2022)   PRAPARE - Administrator, Civil Service (Medical): No    Lack of Transportation (Non-Medical): No  Physical Activity: Inactive (  10/26/2022)   Exercise Vital Sign    Days of Exercise per Week: 0 days    Minutes of Exercise per Session: 0 min  Stress: No Stress Concern Present (10/26/2022)   [employer] of Occupational Health - Occupational Stress Questionnaire    Feeling of Stress : Not at all  Social Connections: Socially Isolated (10/26/2022)   Social Connection and Isolation Panel    Frequency of Communication with Friends and Family: More than three times a week    Frequency of Social Gatherings with Friends and Family:  Once a week    Attends Religious Services: Never    Database Administrator or Organizations: No    Attends Banker Meetings: Never    Marital Status: Widowed  Intimate Partner Violence: Not At Risk (10/26/2022)   Humiliation, Afraid, Rape, and Kick questionnaire    Fear of Current or Ex-Partner: No    Emotionally Abused: No    Physically Abused: No    Sexually Abused: No  Depression (PHQ2-9): Low Risk (04/03/2024)   Depression (PHQ2-9)    PHQ-2 Score: 0  Alcohol Screen: Low Risk (10/26/2022)   Alcohol Screen    Last Alcohol Screening Score (AUDIT): 0  Housing: Low Risk (10/26/2022)   Housing    Last Housing Risk Score: 0  Utilities: Not At Risk (10/26/2022)   AHC Utilities    Threatened with loss of utilities: No  Health Literacy: Adequate Health Literacy (10/26/2022)   B1300 Health Literacy    Frequency of need for help with medical instructions: Never      Review of Systems     Objective:   Physical Exam Vitals reviewed.  Constitutional:      Appearance: Normal appearance. He is obese.  Cardiovascular:     Rate and Rhythm: Normal rate and regular rhythm.     Heart sounds: Normal heart sounds. No murmur heard.    No friction rub. No gallop.  Pulmonary:     Effort: Pulmonary effort is normal. No respiratory distress.     Breath sounds: Normal breath sounds. No wheezing, rhonchi or rales.  Abdominal:     General: Abdomen is flat. Bowel sounds are normal. There is no distension.     Palpations: Abdomen is soft. There is no mass.     Tenderness: There is no abdominal tenderness. There is no guarding or rebound.     Hernia: No hernia is present.  Musculoskeletal:     Right lower leg: No edema.     Left lower leg: No edema.  Neurological:     General: No focal deficit present.     Mental Status: He is alert and oriented to person, place, and time. Mental status is at baseline.     Cranial Nerves: No cranial nerve deficit.     Motor: No weakness.        Dyslipidemia  Essential hypertension  General medical exam  Hypertension associated with type 2 diabetes mellitus (HCC)  Coronary artery disease involving native coronary artery of native heart without angina pectoris I am happy with his blood pressure.  I am very happy with his A1c and his cholesterol.  I will check a PSA to screen for prostate cancer.  I recommended a CT scan of the lungs to screen for lung cancer which he declined.  I recommended colon cancer screening and even offered Cologuard but he declined this as well.  He received his shingles vaccine today.  He politely declined the pneumonia vaccine, a  tetanus shot, and a flu shot.  Recommended vitamin B12 1000 mcg daily. "

## 2024-04-04 LAB — VITAMIN B12: Vitamin B-12: 156 pg/mL — ABNORMAL LOW (ref 200–1100)

## 2024-04-04 LAB — COMPLETE METABOLIC PANEL WITHOUT GFR
AG Ratio: 1.9 (calc) (ref 1.0–2.5)
ALT: 23 U/L (ref 9–46)
AST: 20 U/L (ref 10–35)
Albumin: 4.3 g/dL (ref 3.6–5.1)
Alkaline phosphatase (APISO): 55 U/L (ref 35–144)
BUN: 9 mg/dL (ref 7–25)
CO2: 26 mmol/L (ref 20–32)
Calcium: 9.2 mg/dL (ref 8.6–10.3)
Chloride: 107 mmol/L (ref 98–110)
Creat: 0.8 mg/dL (ref 0.70–1.35)
Globulin: 2.3 g/dL (ref 1.9–3.7)
Glucose, Bld: 108 mg/dL — ABNORMAL HIGH (ref 65–99)
Potassium: 4.3 mmol/L (ref 3.5–5.3)
Sodium: 141 mmol/L (ref 135–146)
Total Bilirubin: 0.4 mg/dL (ref 0.2–1.2)
Total Protein: 6.6 g/dL (ref 6.1–8.1)

## 2024-04-04 LAB — PSA: PSA: 0.25 ng/mL

## 2024-04-04 LAB — CBC WITH DIFFERENTIAL/PLATELET
Absolute Lymphocytes: 3311 {cells}/uL (ref 850–3900)
Absolute Monocytes: 949 {cells}/uL (ref 200–950)
Basophils Absolute: 147 {cells}/uL (ref 0–200)
Basophils Relative: 1.3 %
Eosinophils Absolute: 622 {cells}/uL — ABNORMAL HIGH (ref 15–500)
Eosinophils Relative: 5.5 %
HCT: 45.7 % (ref 39.4–51.1)
Hemoglobin: 15.4 g/dL (ref 13.2–17.1)
MCH: 29.1 pg (ref 27.0–33.0)
MCHC: 33.7 g/dL (ref 31.6–35.4)
MCV: 86.4 fL (ref 81.4–101.7)
MPV: 10.7 fL (ref 7.5–12.5)
Monocytes Relative: 8.4 %
Neutro Abs: 6272 {cells}/uL (ref 1500–7800)
Neutrophils Relative %: 55.5 %
Platelets: 243 Thousand/uL (ref 140–400)
RBC: 5.29 Million/uL (ref 4.20–5.80)
RDW: 13.7 % (ref 11.0–15.0)
Total Lymphocyte: 29.3 %
WBC: 11.3 Thousand/uL — ABNORMAL HIGH (ref 3.8–10.8)

## 2024-04-04 LAB — MICROALBUMIN / CREATININE URINE RATIO
Creatinine, Urine: 167 mg/dL (ref 20–320)
Microalb Creat Ratio: 16 mg/g{creat}
Microalb, Ur: 2.6 mg/dL

## 2024-04-04 LAB — LIPID PANEL
Cholesterol: 93 mg/dL
HDL: 36 mg/dL — ABNORMAL LOW
LDL Cholesterol (Calc): 42 mg/dL
Non-HDL Cholesterol (Calc): 57 mg/dL
Total CHOL/HDL Ratio: 2.6 (calc)
Triglycerides: 69 mg/dL

## 2024-04-04 LAB — HEMOGLOBIN A1C
Hgb A1c MFr Bld: 5.7 % — ABNORMAL HIGH
Mean Plasma Glucose: 117 mg/dL
eAG (mmol/L): 6.5 mmol/L

## 2024-04-04 LAB — TEST AUTHORIZATION

## 2024-07-02 ENCOUNTER — Ambulatory Visit

## 2025-03-31 ENCOUNTER — Other Ambulatory Visit

## 2025-04-07 ENCOUNTER — Encounter: Admitting: Family Medicine
